# Patient Record
Sex: Female | Born: 1940 | Race: White | Hispanic: No | Marital: Married | Smoking: Current every day smoker
Health system: Southern US, Community
[De-identification: ages and names within clinical notes are randomized; demographics above are authoritative.]

## PROBLEM LIST (undated history)

## (undated) DIAGNOSIS — T8859XA Other complications of anesthesia, initial encounter: Secondary | ICD-10-CM

## (undated) DIAGNOSIS — Z720 Tobacco use: Principal | ICD-10-CM

## (undated) DIAGNOSIS — Z87891 Personal history of nicotine dependence: Principal | ICD-10-CM

## (undated) DIAGNOSIS — L570 Actinic keratosis: Secondary | ICD-10-CM

## (undated) DIAGNOSIS — C50919 Malignant neoplasm of unspecified site of unspecified female breast: Secondary | ICD-10-CM

## (undated) DIAGNOSIS — I10 Essential (primary) hypertension: Secondary | ICD-10-CM

## (undated) DIAGNOSIS — C801 Malignant (primary) neoplasm, unspecified: Secondary | ICD-10-CM

## (undated) DIAGNOSIS — Z923 Personal history of irradiation: Secondary | ICD-10-CM

## (undated) DIAGNOSIS — K219 Gastro-esophageal reflux disease without esophagitis: Secondary | ICD-10-CM

## (undated) DIAGNOSIS — T4145XA Adverse effect of unspecified anesthetic, initial encounter: Secondary | ICD-10-CM

## (undated) HISTORY — PX: BREAST EXCISIONAL BIOPSY: SUR124

## (undated) HISTORY — DX: Personal history of nicotine dependence: Z87.891

## (undated) HISTORY — DX: Actinic keratosis: L57.0

## (undated) HISTORY — PX: CHOLECYSTECTOMY: SHX55

## (undated) HISTORY — DX: Tobacco use: Z72.0

## (undated) HISTORY — PX: TUBAL LIGATION: SHX77

## (undated) HISTORY — DX: Essential (primary) hypertension: I10

---

## 1994-09-18 HISTORY — PX: OTHER SURGICAL HISTORY: SHX169

## 1994-09-18 HISTORY — PX: LEEP: SHX91

## 2006-09-18 DIAGNOSIS — C50919 Malignant neoplasm of unspecified site of unspecified female breast: Secondary | ICD-10-CM

## 2006-09-18 HISTORY — DX: Malignant neoplasm of unspecified site of unspecified female breast: C50.919

## 2006-09-18 HISTORY — PX: BREAST EXCISIONAL BIOPSY: SUR124

## 2006-09-18 HISTORY — PX: BREAST LUMPECTOMY: SHX2

## 2006-10-08 ENCOUNTER — Other Ambulatory Visit: Payer: Self-pay

## 2006-10-08 ENCOUNTER — Ambulatory Visit: Payer: Self-pay | Admitting: General Surgery

## 2006-10-12 ENCOUNTER — Ambulatory Visit: Payer: Self-pay | Admitting: General Surgery

## 2006-11-05 ENCOUNTER — Ambulatory Visit: Payer: Self-pay | Admitting: Radiation Oncology

## 2006-11-17 ENCOUNTER — Ambulatory Visit: Payer: Self-pay | Admitting: Radiation Oncology

## 2006-12-18 ENCOUNTER — Ambulatory Visit: Payer: Self-pay | Admitting: Radiation Oncology

## 2007-01-17 ENCOUNTER — Ambulatory Visit: Payer: Self-pay | Admitting: Radiation Oncology

## 2007-02-17 ENCOUNTER — Ambulatory Visit: Payer: Self-pay | Admitting: Radiation Oncology

## 2007-04-19 ENCOUNTER — Ambulatory Visit: Payer: Self-pay | Admitting: Oncology

## 2007-05-01 ENCOUNTER — Ambulatory Visit: Payer: Self-pay | Admitting: General Surgery

## 2007-05-02 ENCOUNTER — Ambulatory Visit: Payer: Self-pay | Admitting: Oncology

## 2007-05-20 ENCOUNTER — Ambulatory Visit: Payer: Self-pay | Admitting: Oncology

## 2007-06-23 ENCOUNTER — Emergency Department: Payer: Self-pay | Admitting: Unknown Physician Specialty

## 2007-06-23 ENCOUNTER — Other Ambulatory Visit: Payer: Self-pay

## 2007-07-02 ENCOUNTER — Ambulatory Visit: Payer: Self-pay | Admitting: General Surgery

## 2007-07-10 ENCOUNTER — Ambulatory Visit: Payer: Self-pay | Admitting: Radiation Oncology

## 2007-07-20 ENCOUNTER — Ambulatory Visit: Payer: Self-pay | Admitting: Radiation Oncology

## 2007-09-19 ENCOUNTER — Ambulatory Visit: Payer: Self-pay | Admitting: Oncology

## 2007-10-02 ENCOUNTER — Ambulatory Visit: Payer: Self-pay | Admitting: General Surgery

## 2007-10-20 ENCOUNTER — Ambulatory Visit: Payer: Self-pay | Admitting: Oncology

## 2007-11-08 ENCOUNTER — Ambulatory Visit: Payer: Self-pay | Admitting: Oncology

## 2007-11-17 ENCOUNTER — Ambulatory Visit: Payer: Self-pay | Admitting: Oncology

## 2007-12-18 ENCOUNTER — Ambulatory Visit: Payer: Self-pay | Admitting: Radiation Oncology

## 2008-01-09 ENCOUNTER — Ambulatory Visit: Payer: Self-pay | Admitting: Oncology

## 2008-01-17 ENCOUNTER — Ambulatory Visit: Payer: Self-pay | Admitting: Radiation Oncology

## 2008-04-13 ENCOUNTER — Ambulatory Visit: Payer: Self-pay | Admitting: General Surgery

## 2008-04-18 ENCOUNTER — Ambulatory Visit: Payer: Self-pay | Admitting: Oncology

## 2008-05-07 ENCOUNTER — Ambulatory Visit: Payer: Self-pay | Admitting: Oncology

## 2008-05-19 ENCOUNTER — Ambulatory Visit: Payer: Self-pay | Admitting: Oncology

## 2008-11-16 ENCOUNTER — Ambulatory Visit: Payer: Self-pay | Admitting: Oncology

## 2008-12-14 ENCOUNTER — Ambulatory Visit: Payer: Self-pay | Admitting: Oncology

## 2008-12-17 ENCOUNTER — Ambulatory Visit: Payer: Self-pay | Admitting: Oncology

## 2009-04-14 ENCOUNTER — Ambulatory Visit: Payer: Self-pay

## 2009-04-22 ENCOUNTER — Ambulatory Visit: Payer: Self-pay | Admitting: Family Medicine

## 2009-05-27 ENCOUNTER — Ambulatory Visit: Payer: Self-pay | Admitting: Surgery

## 2009-06-03 ENCOUNTER — Ambulatory Visit: Payer: Self-pay | Admitting: Surgery

## 2009-06-18 ENCOUNTER — Ambulatory Visit: Payer: Self-pay | Admitting: Oncology

## 2009-06-21 ENCOUNTER — Ambulatory Visit: Payer: Self-pay | Admitting: Oncology

## 2009-07-19 ENCOUNTER — Ambulatory Visit: Payer: Self-pay | Admitting: Oncology

## 2009-12-17 ENCOUNTER — Ambulatory Visit: Payer: Self-pay | Admitting: Oncology

## 2010-01-16 ENCOUNTER — Ambulatory Visit: Payer: Self-pay | Admitting: Oncology

## 2010-02-01 DIAGNOSIS — C439 Malignant melanoma of skin, unspecified: Secondary | ICD-10-CM

## 2010-02-01 HISTORY — DX: Malignant melanoma of skin, unspecified: C43.9

## 2010-06-02 ENCOUNTER — Ambulatory Visit: Payer: Self-pay | Admitting: Family Medicine

## 2010-06-18 ENCOUNTER — Ambulatory Visit: Payer: Self-pay | Admitting: Oncology

## 2010-06-20 ENCOUNTER — Ambulatory Visit: Payer: Self-pay | Admitting: Oncology

## 2010-06-21 LAB — CANCER ANTIGEN 27.29: CA 27.29: 12.1 U/mL (ref 0.0–38.6)

## 2010-07-19 ENCOUNTER — Ambulatory Visit: Payer: Self-pay | Admitting: Oncology

## 2010-12-20 ENCOUNTER — Ambulatory Visit: Payer: Self-pay | Admitting: Oncology

## 2010-12-21 LAB — CANCER ANTIGEN 27.29: CA 27.29: 21.3 U/mL (ref 0.0–38.6)

## 2011-01-17 ENCOUNTER — Ambulatory Visit: Payer: Self-pay | Admitting: Oncology

## 2011-06-26 ENCOUNTER — Ambulatory Visit: Payer: Self-pay | Admitting: Oncology

## 2011-06-29 ENCOUNTER — Ambulatory Visit: Payer: Self-pay | Admitting: Oncology

## 2011-07-20 ENCOUNTER — Ambulatory Visit: Payer: Self-pay | Admitting: Oncology

## 2011-12-28 ENCOUNTER — Ambulatory Visit: Payer: Self-pay | Admitting: Oncology

## 2011-12-28 LAB — COMPREHENSIVE METABOLIC PANEL
Albumin: 3.7 g/dL (ref 3.4–5.0)
Alkaline Phosphatase: 76 U/L (ref 50–136)
Anion Gap: 10 (ref 7–16)
Calcium, Total: 9.4 mg/dL (ref 8.5–10.1)
Chloride: 102 mmol/L (ref 98–107)
Co2: 29 mmol/L (ref 21–32)
Creatinine: 0.92 mg/dL (ref 0.60–1.30)
EGFR (Non-African Amer.): >60
Osmolality: 286 (ref 275–301)
Potassium: 3.4 mmol/L — ABNORMAL LOW (ref 3.5–5.1)
SGPT (ALT): 19 U/L
Sodium: 141 mmol/L (ref 136–145)
Total Protein: 7.6 g/dL (ref 6.4–8.2)

## 2011-12-28 LAB — CBC CANCER CENTER
Basophil #: 0 x10 3/mm (ref 0.0–0.1)
HGB: 13.8 g/dL (ref 12.0–16.0)
Lymphocyte %: 22.7 %
MCH: 30.8 pg (ref 26.0–34.0)
MCV: 91 fL (ref 80–100)
Monocyte #: 0.6 x10 3/mm (ref 0.2–0.9)
Neutrophil #: 5 x10 3/mm (ref 1.4–6.5)
Neutrophil %: 68.6 %
Platelet: 251 x10 3/mm (ref 150–440)
RDW: 13.2 % (ref 11.5–14.5)

## 2011-12-29 LAB — CANCER ANTIGEN 27.29: CA 27.29: 19.9 U/mL (ref 0.0–38.6)

## 2012-01-17 ENCOUNTER — Ambulatory Visit: Payer: Self-pay | Admitting: Oncology

## 2012-06-26 ENCOUNTER — Ambulatory Visit: Payer: Self-pay | Admitting: Oncology

## 2012-12-17 ENCOUNTER — Ambulatory Visit: Payer: Self-pay | Admitting: Oncology

## 2012-12-30 LAB — CBC CANCER CENTER
Basophil #: 0 x10 3/mm (ref 0.0–0.1)
Basophil %: 0.5 %
Eosinophil #: 0 x10 3/mm (ref 0.0–0.7)
HCT: 43.1 % (ref 35.0–47.0)
Lymphocyte #: 2.2 x10 3/mm (ref 1.0–3.6)
MCH: 30.7 pg (ref 26.0–34.0)
MCHC: 34.1 g/dL (ref 32.0–36.0)
MCV: 90 fL (ref 80–100)
Monocyte #: 0.5 x10 3/mm (ref 0.2–0.9)
Monocyte %: 6.6 %
Neutrophil #: 5.3 x10 3/mm (ref 1.4–6.5)
RBC: 4.78 10*6/uL (ref 3.80–5.20)
RDW: 13.1 % (ref 11.5–14.5)
WBC: 8.1 x10 3/mm (ref 3.6–11.0)

## 2012-12-30 LAB — COMPREHENSIVE METABOLIC PANEL
Albumin: 3.6 g/dL (ref 3.4–5.0)
Alkaline Phosphatase: 79 U/L (ref 50–136)
Anion Gap: 9 (ref 7–16)
BUN: 18 mg/dL (ref 7–18)
Bilirubin,Total: 0.4 mg/dL (ref 0.2–1.0)
Calcium, Total: 9.6 mg/dL (ref 8.5–10.1)
Chloride: 101 mmol/L (ref 98–107)
Co2: 31 mmol/L (ref 21–32)
Creatinine: 1.08 mg/dL (ref 0.60–1.30)
EGFR (African American): 59 — ABNORMAL LOW
EGFR (Non-African Amer.): 51 — ABNORMAL LOW
Osmolality: 284 (ref 275–301)
Potassium: 3.3 mmol/L — ABNORMAL LOW (ref 3.5–5.1)
SGOT(AST): 18 U/L (ref 15–37)
SGPT (ALT): 24 U/L (ref 12–78)
Sodium: 141 mmol/L (ref 136–145)

## 2013-01-16 ENCOUNTER — Ambulatory Visit: Payer: Self-pay | Admitting: Oncology

## 2013-06-30 ENCOUNTER — Ambulatory Visit: Payer: Self-pay | Admitting: Oncology

## 2013-12-26 ENCOUNTER — Ambulatory Visit: Payer: Self-pay | Admitting: Oncology

## 2013-12-29 LAB — CBC CANCER CENTER
Basophil #: 0 x10 3/mm (ref 0.0–0.1)
Basophil %: 0.5 %
Eosinophil #: 0.1 x10 3/mm (ref 0.0–0.7)
Eosinophil %: 1.2 %
HCT: 43.4 % (ref 35.0–47.0)
HGB: 14.2 g/dL (ref 12.0–16.0)
Lymphocyte #: 1.9 x10 3/mm (ref 1.0–3.6)
Lymphocyte %: 22.9 %
MCH: 30 pg (ref 26.0–34.0)
MCHC: 32.7 g/dL (ref 32.0–36.0)
MCV: 92 fL (ref 80–100)
MONOS PCT: 6.5 %
Monocyte #: 0.5 x10 3/mm (ref 0.2–0.9)
Neutrophil #: 5.7 x10 3/mm (ref 1.4–6.5)
Neutrophil %: 68.9 %
PLATELETS: 281 x10 3/mm (ref 150–440)
RBC: 4.72 10*6/uL (ref 3.80–5.20)
RDW: 13.8 % (ref 11.5–14.5)
WBC: 8.2 x10 3/mm (ref 3.6–11.0)

## 2013-12-29 LAB — COMPREHENSIVE METABOLIC PANEL
ALBUMIN: 3.7 g/dL (ref 3.4–5.0)
ANION GAP: 5 — AB (ref 7–16)
AST: 18 U/L (ref 15–37)
Alkaline Phosphatase: 59 U/L
BILIRUBIN TOTAL: 0.4 mg/dL (ref 0.2–1.0)
BUN: 19 mg/dL — ABNORMAL HIGH (ref 7–18)
CALCIUM: 9.5 mg/dL (ref 8.5–10.1)
Chloride: 102 mmol/L (ref 98–107)
Co2: 35 mmol/L — ABNORMAL HIGH (ref 21–32)
Creatinine: 0.95 mg/dL (ref 0.60–1.30)
EGFR (Non-African Amer.): 59 — ABNORMAL LOW
GLUCOSE: 125 mg/dL — AB (ref 65–99)
Osmolality: 287 (ref 275–301)
Potassium: 3.1 mmol/L — ABNORMAL LOW (ref 3.5–5.1)
SGPT (ALT): 16 U/L (ref 12–78)
Sodium: 142 mmol/L (ref 136–145)
TOTAL PROTEIN: 7.6 g/dL (ref 6.4–8.2)

## 2014-01-16 ENCOUNTER — Ambulatory Visit: Payer: Self-pay | Admitting: Oncology

## 2014-07-01 ENCOUNTER — Ambulatory Visit: Payer: Self-pay | Admitting: Oncology

## 2014-12-30 ENCOUNTER — Ambulatory Visit
Admit: 2014-12-30 | Disposition: A | Discharge status: Home / Self Care | Payer: Self-pay | Attending: Oncology | Admitting: Oncology

## 2014-12-30 LAB — COMPREHENSIVE METABOLIC PANEL
ALT: 15 U/L
AST: 23 U/L
Albumin: 4.1 g/dL
Alkaline Phosphatase: 47 U/L
Anion Gap: 6 — ABNORMAL LOW (ref 7–16)
BILIRUBIN TOTAL: 0.7 mg/dL
BUN: 21 mg/dL — AB
CREATININE: 0.99 mg/dL
Calcium, Total: 9.2 mg/dL
Chloride: 100 mmol/L — ABNORMAL LOW
Co2: 30 mmol/L
EGFR (African American): >60
EGFR (Non-African Amer.): 56 — ABNORMAL LOW
Glucose: 117 mg/dL — ABNORMAL HIGH
POTASSIUM: 3 mmol/L — AB
Sodium: 136 mmol/L
TOTAL PROTEIN: 7.3 g/dL

## 2014-12-30 LAB — CBC CANCER CENTER
BASOS PCT: 0.7 %
Basophil #: 0.1 x10 3/mm (ref 0.0–0.1)
EOS ABS: 0 x10 3/mm (ref 0.0–0.7)
Eosinophil %: 0.4 %
HCT: 42.6 % (ref 35.0–47.0)
HGB: 14.2 g/dL (ref 12.0–16.0)
Lymphocyte #: 1.8 x10 3/mm (ref 1.0–3.6)
Lymphocyte %: 23.1 %
MCH: 30.6 pg (ref 26.0–34.0)
MCHC: 33.4 g/dL (ref 32.0–36.0)
MCV: 92 fL (ref 80–100)
MONO ABS: 0.5 x10 3/mm (ref 0.2–0.9)
Monocyte %: 7 %
NEUTROS PCT: 68.8 %
Neutrophil #: 5.4 x10 3/mm (ref 1.4–6.5)
Platelet: 266 x10 3/mm (ref 150–440)
RBC: 4.65 10*6/uL (ref 3.80–5.20)
RDW: 13.3 % (ref 11.5–14.5)
WBC: 7.8 x10 3/mm (ref 3.6–11.0)

## 2014-12-31 LAB — CANCER ANTIGEN 27.29: CA 27.29: 14.9 U/mL (ref 0.0–38.6)

## 2015-01-14 ENCOUNTER — Other Ambulatory Visit: Payer: Self-pay | Admitting: Oncology

## 2015-01-14 DIAGNOSIS — Z1231 Encounter for screening mammogram for malignant neoplasm of breast: Secondary | ICD-10-CM

## 2015-01-19 ENCOUNTER — Other Ambulatory Visit: Payer: Self-pay | Admitting: *Deleted

## 2015-01-27 ENCOUNTER — Encounter: Payer: Self-pay | Admitting: Family Medicine

## 2015-01-27 ENCOUNTER — Other Ambulatory Visit: Payer: Self-pay | Admitting: Family Medicine

## 2015-01-27 DIAGNOSIS — Z720 Tobacco use: Secondary | ICD-10-CM

## 2015-01-27 HISTORY — DX: Tobacco use: Z72.0

## 2015-01-29 ENCOUNTER — Other Ambulatory Visit: Payer: Self-pay | Admitting: Family Medicine

## 2015-01-29 ENCOUNTER — Inpatient Hospital Stay: Payer: Medicare Other | Attending: Family Medicine | Admitting: Family Medicine

## 2015-01-29 ENCOUNTER — Encounter: Payer: Self-pay | Admitting: Family Medicine

## 2015-01-29 ENCOUNTER — Ambulatory Visit
Admission: RE | Admit: 2015-01-29 | Discharge: 2015-01-29 | Disposition: A | Discharge status: Home / Self Care | Payer: Medicare Other | Source: Ambulatory Visit | Attending: Family Medicine | Admitting: Family Medicine

## 2015-01-29 DIAGNOSIS — Z122 Encounter for screening for malignant neoplasm of respiratory organs: Secondary | ICD-10-CM

## 2015-01-29 DIAGNOSIS — Z720 Tobacco use: Secondary | ICD-10-CM

## 2015-01-29 DIAGNOSIS — F17210 Nicotine dependence, cigarettes, uncomplicated: Secondary | ICD-10-CM | POA: Diagnosis not present

## 2015-01-29 DIAGNOSIS — F17200 Nicotine dependence, unspecified, uncomplicated: Secondary | ICD-10-CM | POA: Diagnosis present

## 2015-01-29 DIAGNOSIS — R918 Other nonspecific abnormal finding of lung field: Secondary | ICD-10-CM | POA: Diagnosis not present

## 2015-01-29 NOTE — Progress Notes (Signed)
In accordance with CMS guidelines, patient has meet eligibility criteria including age, absence of signs or symptoms of lung cancer, the specific calculation of cigarette smoking pack-years was 30 years and is a current smoker.   A shared decision-making session was been conducted prior to the performance of CT scan. This includes one or more decision aids, includes benefits and harms of screening, follow-up diagnostic testing, over-diagnosis, false positive rate, and total radiation exposure.  Counseling on the importance of adherence to annual lung cancer LDCT screening, impact of co-morbidities, and ability or willingness to undergo diagnosis and treatment is imperative for compliance of the program.  Counseling on the importance of continued smoking cessation for former smokers; the importance of smoking cessation for current smokers and information about tobacco cessation interventions have been given to patient including the [place] at [facility], 1800 quit [place], as well as [place] specific smoking cessation programs.  Written order for lung cancer screening with LDCT has been given to the patient and any and all questions have been answered to the best of my abilities.   Yearly follow up will be scheduled by Burgess Estelle, Thoracic Navigator.

## 2015-02-01 ENCOUNTER — Other Ambulatory Visit: Payer: Self-pay | Admitting: Family Medicine

## 2015-02-01 MED ORDER — AMOXICILLIN-POT CLAVULANATE 875-125 MG PO TABS: 1.0000 | ORAL_TABLET | Freq: Two times a day (BID) | ORAL | Status: DC

## 2015-02-02 ENCOUNTER — Telehealth: Payer: Self-pay | Admitting: *Deleted

## 2015-02-02 NOTE — Telephone Encounter (Signed)
Notified patient of LDCT lung cancer screening results of Lung Rads 0 finding with recommendation for 3 month follow up imaging after a course of antibiotics. Prescription sent to pharmacy by Georgeanne Nim NP. Patient verbalizes understanding.

## 2015-03-09 ENCOUNTER — Other Ambulatory Visit: Payer: Self-pay | Admitting: Family Medicine

## 2015-05-14 ENCOUNTER — Telehealth: Payer: Self-pay

## 2015-05-14 NOTE — Telephone Encounter (Signed)
Navigator Encounter Type: Telephone, Screening  Notified patient that follow-up lung cancer screening low dose CT scan is due. Patient was advised to return for repeat CT following antibiotic therapy. Patient's last screening date was 01/29/2015. Patient was agreeable to scheduling of CT scan.

## 2015-05-31 ENCOUNTER — Encounter: Payer: Self-pay | Admitting: Family Medicine

## 2015-05-31 ENCOUNTER — Other Ambulatory Visit: Payer: Self-pay | Admitting: Family Medicine

## 2015-05-31 DIAGNOSIS — Z87891 Personal history of nicotine dependence: Secondary | ICD-10-CM

## 2015-05-31 HISTORY — DX: Personal history of nicotine dependence: Z87.891

## 2015-06-04 ENCOUNTER — Ambulatory Visit: Payer: Medicare Other | Admitting: Family Medicine

## 2015-06-08 ENCOUNTER — Ambulatory Visit
Admission: RE | Admit: 2015-06-08 | Discharge: 2015-06-08 | Disposition: A | Discharge status: Home / Self Care | Payer: Medicare Other | Source: Ambulatory Visit | Attending: Family Medicine | Admitting: Family Medicine

## 2015-06-08 ENCOUNTER — Telehealth: Payer: Self-pay | Admitting: *Deleted

## 2015-06-08 DIAGNOSIS — R938 Abnormal findings on diagnostic imaging of other specified body structures: Secondary | ICD-10-CM | POA: Diagnosis not present

## 2015-06-08 DIAGNOSIS — Z87891 Personal history of nicotine dependence: Secondary | ICD-10-CM

## 2015-06-08 DIAGNOSIS — R918 Other nonspecific abnormal finding of lung field: Secondary | ICD-10-CM | POA: Diagnosis not present

## 2015-06-08 NOTE — Telephone Encounter (Signed)
For lung cancer screening. Confirmed that patient is within the age range of 55-77, and asymptomatic, (no signs or symptoms of lung cancer). The patient is a current smoker, with a 49 pack year history. The shared decision making visit was done 01/29/15. Patient is agreeable for CT scan being scheduled.

## 2015-06-23 ENCOUNTER — Ambulatory Visit: Payer: Medicare Other | Admitting: Oncology

## 2015-07-06 ENCOUNTER — Inpatient Hospital Stay: Payer: Medicare Other | Attending: Oncology | Admitting: Oncology

## 2015-07-06 ENCOUNTER — Ambulatory Visit
Admission: RE | Admit: 2015-07-06 | Discharge: 2015-07-06 | Disposition: A | Discharge status: Home / Self Care | Payer: Medicare Other | Source: Ambulatory Visit | Attending: Oncology | Admitting: Oncology

## 2015-07-06 ENCOUNTER — Encounter: Payer: Self-pay | Admitting: Oncology

## 2015-07-06 ENCOUNTER — Other Ambulatory Visit: Payer: Self-pay | Admitting: Oncology

## 2015-07-06 VITALS — BP 148/77 | HR 97 | Temp 95.7°F | Wt 110.7 lb

## 2015-07-06 DIAGNOSIS — Z853 Personal history of malignant neoplasm of breast: Secondary | ICD-10-CM | POA: Diagnosis not present

## 2015-07-06 DIAGNOSIS — Z170 Estrogen receptor positive status [ER+]: Secondary | ICD-10-CM | POA: Diagnosis not present

## 2015-07-06 DIAGNOSIS — I10 Essential (primary) hypertension: Secondary | ICD-10-CM | POA: Diagnosis not present

## 2015-07-06 DIAGNOSIS — E079 Disorder of thyroid, unspecified: Secondary | ICD-10-CM | POA: Insufficient documentation

## 2015-07-06 DIAGNOSIS — Z923 Personal history of irradiation: Secondary | ICD-10-CM | POA: Diagnosis not present

## 2015-07-06 DIAGNOSIS — Z1231 Encounter for screening mammogram for malignant neoplasm of breast: Secondary | ICD-10-CM | POA: Diagnosis present

## 2015-07-06 DIAGNOSIS — C50919 Malignant neoplasm of unspecified site of unspecified female breast: Secondary | ICD-10-CM | POA: Insufficient documentation

## 2015-07-06 DIAGNOSIS — M1990 Unspecified osteoarthritis, unspecified site: Secondary | ICD-10-CM | POA: Insufficient documentation

## 2015-07-06 DIAGNOSIS — R918 Other nonspecific abnormal finding of lung field: Secondary | ICD-10-CM | POA: Insufficient documentation

## 2015-07-06 DIAGNOSIS — Z79899 Other long term (current) drug therapy: Secondary | ICD-10-CM | POA: Diagnosis not present

## 2015-07-06 DIAGNOSIS — Z9223 Personal history of estrogen therapy: Secondary | ICD-10-CM | POA: Diagnosis not present

## 2015-07-06 DIAGNOSIS — F17210 Nicotine dependence, cigarettes, uncomplicated: Secondary | ICD-10-CM | POA: Insufficient documentation

## 2015-07-06 DIAGNOSIS — C50212 Malignant neoplasm of upper-inner quadrant of left female breast: Secondary | ICD-10-CM

## 2015-07-06 DIAGNOSIS — Z800 Family history of malignant neoplasm of digestive organs: Secondary | ICD-10-CM | POA: Diagnosis not present

## 2015-07-06 DIAGNOSIS — G43909 Migraine, unspecified, not intractable, without status migrainosus: Secondary | ICD-10-CM | POA: Insufficient documentation

## 2015-07-06 HISTORY — DX: Malignant (primary) neoplasm, unspecified: C80.1

## 2015-07-06 NOTE — Progress Notes (Signed)
[place] @ [facility] [phone]  Fax:([phone]) [place] OB: Feb 11, 1941  MR#: [id number]  HFW#:[id number]  Patient Care Team: [place] Pink, MD as PCP - General (Family Medicine)  CHIEF COMPLAINT: Chief Complaint/Diagnosis:   carcinoma of breast, Tis, NX, MX, stage 0, estrogen receptor positive, progesterone receptor positive. Margins, and negative Primary Care Physician:  Duffy Rhody MD Dr. Enzo Bi  Chief Complaint/Problem List:  Carcinoma of breast.  Status post lumpectomy and radiation therapy.  Diagnosis was in January of 2008  AJCC Staging: [id number], pTisNXMX Stage Grouping: 0 Cancer Status: No evidence of disease. Estrogen receptor positive. Progesterone receptor 0. Margins are negative.   Patient was started on Arimidex in 2008 after radiation was finished Has finished   Arimidex in  October of 2013 HPI:   2.  Abnormal CT scan of the chest in a patient was chronic smoker   INTERVAL HISTORY: 74 year old lady who had a screening CT scan in April of 2016.  Was showing bilateral nodular infiltrate.  Patient had a repeat CT scan in September which shows persistent patchy infiltrate Patient does not have any cough or hemoptysis or chest pain.  Patient continues to smoke.  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  Active.  No fevers, sweats or weight loss. PERFORMANCE STATUS (ECOG):0 HEENT:  No visual changes, runny nose, sore throat, mouth sores or tenderness. Lungs: No shortness of breath or cough.  No hemoptysis. Cardiac:  No chest pain, palpitations, orthopnea, or PND. GI:  No nausea, vomiting, diarrhea, constipation, melena or hematochezia. GU:  No urgency, frequency, dysuria, or hematuria. Musculoskeletal:  No back pain.  No  joint pain.  No muscle tenderness. Extremities:  No pain or swelling. Skin:  No rashes or skin changes. Neuro:  No headache, numbness or weakness, balance or coordination issues. Endocrine:  No diabetes, thyroid issues, hot flashes or night sweats. Psych:  No mood changes, depression or anxiety. Pain:  No focal pain. Review of systems:  All other systems reviewed and found to be negative. As per HPI. Otherwise, a complete review of systems is negatve.  PAST MEDICAL HISTORY: Past Medical History  Diagnosis Date  . Tobacco abuse 01/27/2015  . Tobacco abuse 01/27/2015  . Personal history of tobacco use, presenting hazards to health 05/31/2015    Significant History/PMH:   htn:    radiation to breast:    tubal ligation:    leep procedure:   Preventive Screening:  Has patient had any of the following test? Mammography (1)   Last Mammography: October 2013(1)   Smoking History: Smoking History 1/2 pack per day since college days.  PFSH: Additional Past Medical and Surgical History: has. been reviewed  Past Medical History:  No significant past history except for high blood pressure    Past Surgical History:  Previous lumpectomy on the right breast with atypical proliferation of cells status post 5 years of Tamoxifen finished in 2006.    Family History:  No family history of colorectal cancer, breast cancer, or ovarian cancer.   History of pancreatic cancer in the family.    Social History:  Occasionally drinks alcohol. Does not use any recreational drugs.   ADVANCED DIRECTIVES:  No flowsheet data found.  HEALTH MAINTENANCE: Social History  Substance Use Topics  .  Smoking status: Current Every Day Smoker -- 1.50 packs/day for 32 years    Types: Cigarettes  . Smokeless tobacco: Never Used  . Alcohol Use: None      Allergies  Allergen Reactions  . Codeine Nausea And Vomiting  . Midol Pm  [Diphenhydramine-Acetaminophen] Rash    Other Reaction: RASH & SWELLING     Current Outpatient Prescriptions  Medication Sig Dispense Refill  . alendronate (FOSAMAX) 70 MG tablet   1  . amLODipine (NORVASC) 2.5 MG tablet Take 7.5 mg by mouth daily.  0  . amoxicillin-clavulanate (AUGMENTIN) 875-125 MG per tablet Take 1 tablet by mouth 2 (two) times daily. (Patient not taking: Reported on 07/06/2015) 28 tablet 0  . FLUZONE HIGH-DOSE 0.5 ML SUSY inject 0.5 milliliter intramuscularly  0  . potassium chloride (MICRO-K) 10 MEQ CR capsule   0  . triamterene-hydrochlorothiazide (DYAZIDE) 37.5-25 MG capsule   1   No current facility-administered medications for this visit.    OBJECTIVE:  Filed Vitals:   07/06/15 0920  BP: 148/77  Pulse: 97  Temp: 95.7 F (35.4 C)     Body mass index is 21.25 kg/(m^2).    ECOG FS:1 - Symptomatic but completely ambulatory  PHYSICAL EXAM: GENERAL:  Well developed, well nourished, sitting comfortably in the exam room in no acute distress. MENTAL STATUS:  Alert and oriented to person, place and time.  RESPIRATORY:  Clear to auscultation without rales, wheezes or rhonchi. CARDIOVASCULAR:  Regular rate and rhythm without murmur, rub or gallop. BREAST:  Right breast without masses, skin changes or nipple discharge.  Left breast without masses, skin changes or nipple discharge. ABDOMEN:  Soft, non-tender, with active bowel sounds, and no hepatosplenomegaly.  No masses. BACK:  No CVA tenderness.  No tenderness on percussion of the back or rib cage. SKIN:  No rashes, ulcers or lesions. EXTREMITIES: No edema, no skin discoloration or tenderness.  No palpable cords. LYMPH NODES: No palpable cervical, supraclavicular, axillary or inguinal adenopathy  NEUROLOGICAL: Unremarkable. PSYCH:  Appropriate.   LAB RESULTS:  CBC Latest Ref Rng 12/30/2014 12/29/2013  WBC 3.6-11.0 x10 3/mm  7.8 8.2  Hemoglobin 12.0-16.0 g/dL 14.2 14.2  Hematocrit 35.0-47.0 % 42.6 43.4  Platelets 150-440 x10 3/mm  266 281    No visits with results within 5  Day(s) from this visit. Latest known visit with results is:  Hospital Outpatient Visit on 12/30/2014  Component Date Value Ref Range Status  . CA 27.29 12/30/2014 14.9  0.0-38.6 U/mL Final   Comment:  [employer]            No: [id number]           [address] [place], [place] [address]           Lindon Romp, MD         [phone] Result(s) reported on 31 Dec 2014 at 12:48PM.   . WBC 12/30/2014 7.8  3.6-11.0 x10 3/mm  Final  . RBC 12/30/2014 4.65  3.80-5.20 x10 6/mm  Final  . HGB 12/30/2014 14.2  12.0-16.0 g/dL Final  . HCT 12/30/2014 42.6  35.0-47.0 % Final  . MCV 12/30/2014 92  80-100 fL Final  . MCH 12/30/2014 30.6  26.0-34.0 pg Final  . MCHC 12/30/2014 33.4  32.0-36.0 g/dL Final  . RDW 12/30/2014 13.3  11.5-14.5 % Final  . Platelet 12/30/2014 266  150-440 x10 3/mm  Final  . Neutrophil %  12/30/2014 68.8   Final  . Lymphocyte % 12/30/2014 23.1   Final  . Monocyte % 12/30/2014 7.0   Final  . Eosinophil % 12/30/2014 0.4   Final  . Basophil % 12/30/2014 0.7   Final  . Neutrophil # 12/30/2014 5.4  1.4-6.5 x10 3/mm  Final  . Lymphocyte # 12/30/2014 1.8  1.0-3.6 x10 3/mm  Final  . Monocyte # 12/30/2014 0.5  0.2-0.9 x10 3/mm  Final  . Eosinophil # 12/30/2014 0.0  0.0-0.7 x10 3/mm  Final  . Basophil # 12/30/2014 0.1  0.0-0.1 x10 3/mm  Final  . Glucose, CSF 12/30/2014 DNP   Corrected   Comment: 65-99 NOTE: New Reference Range  11/24/14   . BUN 12/30/2014 21*  Final   Comment: 6-20 NOTE: New Reference Range  11/24/14   . Creatinine 12/30/2014 0.99   Final   Comment: 0.44-1.00 NOTE: New Reference Range  11/24/14   . Sodium, Urine Random 12/30/2014 DNP   Corrected   Comment: 135-145 NOTE: New Reference Range  11/24/14   . Potassium, Urine Random 12/30/2014 DNP   Corrected   Comment: 3.5-5.1 NOTE: New Reference Range  11/24/14   . Chloride, Urine Random 12/30/2014 DNP   Corrected   Comment: 101-111 NOTE:  New Reference Range  11/24/14   . Co2 12/30/2014 30   Final   Comment: 22-32 NOTE: New Reference Range  11/24/14   . Calcium, Total 12/30/2014 9.2   Final   Comment: 8.9-10.3 NOTE: New Reference Range  11/24/14   . SGOT(AST) 12/30/2014 23   Final   Comment: 15-41 NOTE: New Reference Range  11/24/14   . SGPT (ALT) 12/30/2014 15   Final   Comment: 14-54 NOTE: New Reference Range  11/24/14   . Alkaline Phosphatase 12/30/2014 47   Final   Comment: 38-126 NOTE: New Reference Range  11/24/14   . Albumin 12/30/2014 4.1   Final   Comment: 3.5-5.0 NOTE: New reference range  11/24/14   . Total Protein 12/30/2014 7.3   Final   Comment: 6.5-8.1 NOTE: New Reference Range  11/24/14   . Bilirubin,Total 12/30/2014 0.7   Final   Comment: 0.3-1.2 NOTE: New Reference Range  11/24/14   . Anion Gap 12/30/2014 6* 7-16 Final  . EGFR (African American) 12/30/2014 >60   Final  . EGFR (Non-African Amer.) 12/30/2014 56*  Final   Comment: eGFR values <72m/min/1.73 m2 may be an indication of chronic kidney disease (CKD). Calculated eGFR is useful in patients with stable renal function. The eGFR calculation will not be reliable in acutely ill patients when serum creatinine is changing rapidly. It is not useful in patients on dialysis. The eGFR calculation may not be applicable to patients at the low and high extremes of body sizes, pregnant women, and vegetarians.   . Glucose 12/30/2014 117*  Final   Comment: 65-99 NOTE: New Reference Range  11/24/14   . Sodium 12/30/2014 136   Final   Comment: 135-145 NOTE: New Reference Range  11/24/14   . Potassium 12/30/2014 3.0*  Final   Comment: 3.5-5.1 NOTE: New Reference Range  11/24/14   . Chloride 12/30/2014 100*  Final   Comment: 101-111 NOTE: New Reference Range  11/24/14        STUDIES: Ct Chest Lung Ca Screen Low Dose W/o Cm  06/11/2015  CLINICAL DATA:  74year old female current smoker with 50 pack-year history of  smoking. Lung cancer screening examination. EXAM: CT CHEST WITHOUT CONTRAST TECHNIQUE: Multidetector CT imaging of the  chest was performed following the standard protocol without IV contrast. COMPARISON:  Low-dose lung cancer screening chest CT 01/29/2015. FINDINGS: Mediastinum/Lymph Nodes: Heart size is normal. There is no significant pericardial fluid, thickening or pericardial calcification. No pathologically enlarged mediastinal or hilar lymph nodes. Please note that accurate exclusion of hilar adenopathy is limited on noncontrast CT scans. Again noted is asymmetric mass-like enlargement of the right lobe of the thyroid gland which measures up to 3.2 cm (similar to prior). Small hiatal hernia. No axillary lymphadenopathy. Lungs/Pleura: Again noted are innumerable nodular areas of ground-glass attenuation throughout the lungs bilaterally, most evident in the mid to upper lungs. The largest of these lesions is in the inferior aspect of the right upper lobe adjacent to the minor fissure (image 143 of series 3), demonstrating a volume derived mean diameter of 14.8 mm. Although this is smaller than the previously reported measurement, the previously reported measurement was limited by volumetric rendering, and is considered not completely accurate. This lesion is considered stable in size on today's examination when directly compared with the prior. Because of the large number of ground-glass attenuation nodules, not all of the lesions were volumetrically rendered on today's examination, because of technical challenges associated with doing so. However, they appear generally stable in size and number compared to the prior study from 01/29/2015. Mild diffuse bronchial wall thickening with mild centrilobular and paraseptal emphysema. No acute consolidative airspace disease. No pleural effusions. Scattered areas of mild cylindrical bronchiectasis are noted, most evident in the lower lobes of the lungs and in the right  middle lobe. Small right-sided Bochdalek's hernia. Upper Abdomen: Status post cholecystectomy. Incompletely visualized exophytic low-attenuation lesion measuring at least 3.7 cm in the medial aspect of the upper pole of the right kidney is similar to remote prior CT the abdomen and pelvis 06/23/2007, and although incompletely characterized, is likely a cyst. Musculoskeletal/Soft Tissues: Focal area of architectural distortion, lobulated soft tissue density and peripheral calcification in the fat of the left breast, which appears generally similar to the prior study, although there is a focus of increasing soft tissue prominence along the deep margin of this region measuring 11 mm on today's examination (best demonstrated on image 28 of series 2). There are no aggressive appearing lytic or blastic lesions noted in the visualized portions of the skeleton. IMPRESSION: 1. Again noted are multiple predominantly ground-glass attenuation lesions throughout the lungs bilaterally, which generally appear stable compared to the prior examination, as discussed above. Technically, this is classified as a Lung-RADS 2 (benign findings) examination, however, this appearance is very unusual. The possibility of widespread low-grade neoplasm such as primary adenocarcinoma of the lungs should be considered, and a lack of interval growth may simply reflect the indolent nature of such a neoplasm. The largest lesion in the inferior aspect of the right upper lobe (immediately above the minor fissure) on image 143 of series 3 may represent a suitable target for biopsy, as a bronchus appears to traverse immediately adjacent to this. If the patient is not considered for biopsy at this time, a follow-up repeat low dose lung cancer screening chest CT is suggested in 6 months rather than 12 months. 2. The "S" modifier above refers to potentially clinically significant non lung cancer related findings. Specifically, there is a focal area of  architectural distortion in the left breast favored to represent scarring at site of prior lumpectomy. However, there is also a small amount of soft tissue prominence along the deep margin on today's examination appears increased  compared to the prior study. Correlation with mammography is recommended to exclude the possibility of neoplasm. 3. Additional incidental findings, as above. Electronically Signed   By: Vinnie Langton M.D.   On: 06/11/2015 09:25    ASSESSMENT: 1.  Patient has a history of chronic smoking Counseling was done 2.  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  Diagnosis of breast cancer was in 2008.  Patient is off all anti-hormonal therapy 3.  Abnormal CT scan with patchy infiltrate somewhat improvement however both major area of patchy infiltrate persist I reviewed CT scan independently and compared with the previous CT scan. I discussed possibility of repeating CT scan versus getting pulmonology evaluation for possibility of navigation bronchoscopy.  Case will be discussed in tumor conference. 3.  There is a palpable thyroid nodule which needs to be evaluated by ENT surgeon Discuss case in tumor conference If possible will obtain ENT opinion as well as pulmonologists opinion  Patient is getting regular mammogram  Patient expressed understanding and was in agreement with this plan. She also understands that She can call clinic at any time with any questions, concerns, or complaints.    No matching staging information was found for the patient.  Forest Gleason, MD   07/06/2015 12:47 PM

## 2015-07-06 NOTE — Progress Notes (Signed)
Patient here for CT results.  

## 2015-07-22 ENCOUNTER — Ambulatory Visit
Admission: RE | Admit: 2015-07-22 | Discharge: 2015-07-22 | Disposition: A | Discharge status: Home / Self Care | Payer: Medicare Other | Source: Ambulatory Visit | Attending: Pulmonary Disease | Admitting: Pulmonary Disease

## 2015-07-22 ENCOUNTER — Encounter (INDEPENDENT_AMBULATORY_CARE_PROVIDER_SITE_OTHER): Payer: Self-pay

## 2015-07-22 ENCOUNTER — Encounter: Payer: Self-pay | Admitting: Pulmonary Disease

## 2015-07-22 ENCOUNTER — Ambulatory Visit (INDEPENDENT_AMBULATORY_CARE_PROVIDER_SITE_OTHER): Payer: Medicare Other | Admitting: Pulmonary Disease

## 2015-07-22 VITALS — BP 120/82 | HR 81 | Ht 61.0 in | Wt 132.0 lb

## 2015-07-22 DIAGNOSIS — J189 Pneumonia, unspecified organism: Secondary | ICD-10-CM

## 2015-07-22 DIAGNOSIS — Z720 Tobacco use: Secondary | ICD-10-CM

## 2015-07-22 DIAGNOSIS — J9811 Atelectasis: Secondary | ICD-10-CM | POA: Insufficient documentation

## 2015-07-22 DIAGNOSIS — J984 Other disorders of lung: Secondary | ICD-10-CM

## 2015-07-22 DIAGNOSIS — F17200 Nicotine dependence, unspecified, uncomplicated: Secondary | ICD-10-CM

## 2015-07-22 NOTE — Progress Notes (Signed)
PULMONARY CONSULT NOTE  Requesting MD/Service: [place] Date of consult: 07/22/15 Reason for consultation: pulmonary infiltrates   Pt Profile:  67 F smoker with history of breast cancer with history of breast cancer who is followed closely by Dr Oliva Bustard. She underwent LDCT 01/29/15 which revealed bilateral ground glass opacities. A repeat LDCT was performed 06/08/15 with similar findings. Referred to pulmonary medicine for further evaluation.     HPI:  History as above. She has no respiratory symptoms or chest symptoms other than minimal cough productive of scant clear mucus. She has previously smoked up to 1/2 ppd cigarettes and currently smokes 2-3 cigs per day. She has no constitutional symptoms of fever, weight loss, fatigue or night sweats. She has no other systemic symptoms such as myalgias, arthralgias, rashes, lymphadenopathy, ocular symptoms.  Past Medical History  Diagnosis Date  . Tobacco abuse 01/27/2015  . Tobacco abuse 01/27/2015  . Personal history of tobacco use, presenting hazards to health 05/31/2015  . Cancer ([place])     melanoma  . Hypertension    Past Surgical History  Procedure Laterality Date  . Breast excisional biopsy Left 2008    + radation  . Breast excisional biopsy Right 2001 2010    neg surgical bx's    MEDICATIONS: reviewed  SH: smoking history as above. No significant occupational or environmental exposures  Family History  Problem Relation Age of Onset  . Cancer Father    Review of Systems  Constitutional: Negative.   HENT: Negative.   Eyes: Negative.   Respiratory: Negative.   Cardiovascular: Negative.   Gastrointestinal: Negative.   Musculoskeletal: Negative.   Skin: Negative.   Neurological: Negative.   Endo/Heme/Allergies: Negative.     Filed Vitals:   07/22/15 1026  BP: 120/82  Pulse: 81  Height: 5\' 1"  (1.549 m)  Weight: 132 lb (59.875 kg)  SpO2: 98%    EXAM:   Gen: WDWN in NAD HEENT: All WNL Neck: NO LAN, no JVD  noted Lungs: full BS, normal percussion note throughout, no adventitious sounds Cardiovascular: Reg rate, normal rhythm, no M noted Abdomen: Soft, NT +BS Ext: no C/C/E Neuro: CNs intact, motor/sens grossly intact Skin: No lesions noted   DATA:  LDCT 01/29/15: IMPRESSION: 1. Lung-RADS Category 0, incomplete. Scattered ground-glass attenuating opacities and are identified throughout both lungs. Likely postinflammatory or infectious. This limits the ability to evaluate parts of the lung. Recommend repeat low dose Lung cancer screening in 3 months following trial of antibiotic therapy and complete resolution of patient's symptoms.  LDCT 06/08/15: IMPRESSION: 1. Again noted are multiple predominantly ground-glass attenuation lesions throughout the lungs bilaterally, which generally appear stable compared to the prior examination, as discussed above. Technically, this is classified as a Lung-RADS 2 (benign findings) examination, however, this appearance is very unusual. The possibility of widespread low-grade neoplasm such as primary adenocarcinoma of the lungs should be considered, and a lack of interval growth may simply reflect the indolent nature of such a neoplasm.  IMPRESSION:   Smoker - Currently light smoker Pulmonary infiltrates - incidentally found on LDCT. Radiographically stable over 4 months span. Totally asymptomatic. The appearance suggests a nonmalignant process  DISCUSSION: We discussed possible diagnoses and I explained that I think this is most likely not a malignant process. This is likely one of the innumerable noninfectious pneumonitides, perhaps smoking related such as RB-ILD or DIP. We discussed the diagnostic options including surgical biopsy (which is not warranted at this juncture) vs bronchoscopic transbronchial biopsy. I also offered the option  of continuing to follow this over time with the caveat that any development of respiratory symptoms, constitutional  symptoms or progression on Xray studies would warrant a more aggressive diagnostic strategy. She prefers to forgo bronchoscopy @ this time. We discussed in depth the possibility of a smoking related pneumonitis and I emphasized the need for absolute abstinence  PLAN:  CXR today to assess whether infiltrates can be visualized with plain CXR Smoking cessation - counseled in depth CT chest, ILD protocol in four months with office follow up after that to review the results She is to contact us in the interim if she develops any new chest or respiratory symptoms   Merton Border, MD PCCM service

## 2015-11-17 ENCOUNTER — Ambulatory Visit
Admission: RE | Admit: 2015-11-17 | Discharge: 2015-11-17 | Disposition: A | Discharge status: Home / Self Care | Payer: Medicare Other | Source: Ambulatory Visit | Attending: Pulmonary Disease | Admitting: Pulmonary Disease

## 2015-11-17 ENCOUNTER — Ambulatory Visit: Admission: RE | Admit: 2015-11-17 | Payer: Medicare Other | Source: Ambulatory Visit

## 2015-11-17 DIAGNOSIS — J189 Pneumonia, unspecified organism: Secondary | ICD-10-CM | POA: Insufficient documentation

## 2015-11-17 DIAGNOSIS — E042 Nontoxic multinodular goiter: Secondary | ICD-10-CM | POA: Diagnosis not present

## 2015-11-17 DIAGNOSIS — R918 Other nonspecific abnormal finding of lung field: Secondary | ICD-10-CM | POA: Insufficient documentation

## 2015-11-19 ENCOUNTER — Ambulatory Visit (INDEPENDENT_AMBULATORY_CARE_PROVIDER_SITE_OTHER): Payer: Medicare Other | Admitting: Pulmonary Disease

## 2015-11-19 ENCOUNTER — Encounter: Payer: Self-pay | Admitting: Pulmonary Disease

## 2015-11-19 VITALS — BP 138/82 | HR 78 | Ht 61.0 in | Wt 126.8 lb

## 2015-11-19 DIAGNOSIS — F17200 Nicotine dependence, unspecified, uncomplicated: Secondary | ICD-10-CM

## 2015-11-19 DIAGNOSIS — Z720 Tobacco use: Secondary | ICD-10-CM

## 2015-11-19 DIAGNOSIS — J189 Pneumonia, unspecified organism: Secondary | ICD-10-CM

## 2015-11-19 DIAGNOSIS — R918 Other nonspecific abnormal finding of lung field: Secondary | ICD-10-CM

## 2015-11-23 ENCOUNTER — Encounter: Payer: Self-pay | Admitting: Pulmonary Disease

## 2015-11-23 NOTE — Progress Notes (Signed)
PULMONARY OFFICE FOLLOW UP NOTE  Requesting MD/Service: [place] Date of initial consultation: 07/22/15 Reason for consultation: pulmonary infiltrates   Pt Profile:  27 F smoker with history of breast cancer with history of breast cancer who is followed closely by Dr Oliva Bustard. She underwent LDCT 01/29/15 which revealed bilateral ground glass opacities. A repeat LDCT was performed 06/08/15 with similar findings. Referred to pulmonary medicine for further evaluation.   CT chest 06/08/15: Again noted are multiple predominantly ground-glass attenuation lesions throughout the lungs bilaterally, which generally appear stable compared to the prior examination, as discussed above  CT chest 11/17/15: NSC. Now available is CT chest from 2008 which demonstrate similar findings   SUBJ: No new complaints. No major problems since last visit. Continues to smoke a couple of cigs/day  Review of Systems  Constitutional: Negative.   Respiratory: Negative.    OBJ: Filed Vitals:   11/19/15 0931  BP: 138/82  Pulse: 78  Height: 5\' 1"  (1.549 m)  Weight: 126 lb 12.8 oz (57.516 kg)  SpO2: 98%  Room air  Gen: WDWN in NAD HEENT: All WNL Neck: NO LAN, no JVD noted Lungs: full BS, normal percussion note throughout, no adventitious sounds Cardiovascular: Reg rate, normal rhythm, no M noted Abdomen: Soft, NT +BS Ext: no C/C/E Neuro: grossly intact   DATA:  CT chest 11/17/15: .Ground-glass nodularity throughout the lungs, the majority of which appears grossly stable from 11/08/2006. Two pure ground-glass nodules in the right upper lobe appear slightly larger than on baseline examination of 11/08/2006. Indolent adenocarcinoma cannot be excluded.  IMPRESSION:   Smoker - Currently very light smoker Pulmonary infiltrates - incidentally found on LDCT. Radiographically stable over 4 months span. Totally asymptomatic. The appearance suggests a nonmalignant process  DISCUSSION: Given chronicity of these  findings, this is very unlikely to represent malignancy. Unusual pneumonitides such as RBILD or DIP would be considerations. She is totally asymptomatic and wishe to forgo lung biopsy. This is reasonable given readiographic stability over many years  PLAN:  No further radiographic surveillance recommended @ this time Smoking cessation again recommended and discussed F/U PRN  Merton Border, MD PCCM service

## 2015-12-30 ENCOUNTER — Ambulatory Visit: Payer: Self-pay | Admitting: Oncology

## 2015-12-30 ENCOUNTER — Other Ambulatory Visit: Payer: Self-pay

## 2016-01-17 ENCOUNTER — Other Ambulatory Visit: Payer: Self-pay | Admitting: *Deleted

## 2016-01-17 DIAGNOSIS — C50919 Malignant neoplasm of unspecified site of unspecified female breast: Secondary | ICD-10-CM

## 2016-01-20 ENCOUNTER — Inpatient Hospital Stay: Payer: Medicare Other | Attending: Family Medicine

## 2016-01-20 ENCOUNTER — Inpatient Hospital Stay (HOSPITAL_BASED_OUTPATIENT_CLINIC_OR_DEPARTMENT_OTHER): Payer: Medicare Other | Admitting: Family Medicine

## 2016-01-20 ENCOUNTER — Encounter: Payer: Self-pay | Admitting: Family Medicine

## 2016-01-20 VITALS — BP 107/68 | HR 94 | Temp 98.2°F | Wt 120.6 lb

## 2016-01-20 DIAGNOSIS — F17210 Nicotine dependence, cigarettes, uncomplicated: Secondary | ICD-10-CM

## 2016-01-20 DIAGNOSIS — Z170 Estrogen receptor positive status [ER+]: Secondary | ICD-10-CM

## 2016-01-20 DIAGNOSIS — Z85820 Personal history of malignant melanoma of skin: Secondary | ICD-10-CM

## 2016-01-20 DIAGNOSIS — Z79899 Other long term (current) drug therapy: Secondary | ICD-10-CM | POA: Diagnosis not present

## 2016-01-20 DIAGNOSIS — Z923 Personal history of irradiation: Secondary | ICD-10-CM

## 2016-01-20 DIAGNOSIS — Z9223 Personal history of estrogen therapy: Secondary | ICD-10-CM | POA: Diagnosis not present

## 2016-01-20 DIAGNOSIS — E876 Hypokalemia: Secondary | ICD-10-CM

## 2016-01-20 DIAGNOSIS — C50012 Malignant neoplasm of nipple and areola, left female breast: Secondary | ICD-10-CM

## 2016-01-20 DIAGNOSIS — C50919 Malignant neoplasm of unspecified site of unspecified female breast: Secondary | ICD-10-CM

## 2016-01-20 DIAGNOSIS — I10 Essential (primary) hypertension: Secondary | ICD-10-CM | POA: Diagnosis not present

## 2016-01-20 DIAGNOSIS — Z853 Personal history of malignant neoplasm of breast: Secondary | ICD-10-CM | POA: Insufficient documentation

## 2016-01-20 LAB — COMPREHENSIVE METABOLIC PANEL
ALBUMIN: 4.1 g/dL (ref 3.5–5.0)
ALK PHOS: 46 U/L (ref 38–126)
ALT: 10 U/L — AB (ref 14–54)
ANION GAP: 8 (ref 5–15)
AST: 17 U/L (ref 15–41)
BUN: 24 mg/dL — ABNORMAL HIGH (ref 6–20)
CALCIUM: 9.3 mg/dL (ref 8.9–10.3)
CHLORIDE: 102 mmol/L (ref 101–111)
CO2: 30 mmol/L (ref 22–32)
Creatinine, Ser: 0.86 mg/dL (ref 0.44–1.00)
GFR calc Af Amer: >60 mL/min (ref >60)
GFR calc non Af Amer: >60 mL/min (ref >60)
GLUCOSE: 119 mg/dL — AB (ref 65–99)
Potassium: 3.1 mmol/L — ABNORMAL LOW (ref 3.5–5.1)
SODIUM: 140 mmol/L (ref 135–145)
Total Bilirubin: 0.5 mg/dL (ref 0.3–1.2)
Total Protein: 7.3 g/dL (ref 6.5–8.1)

## 2016-01-20 LAB — CBC WITH DIFFERENTIAL/PLATELET
BASOS ABS: 0.1 10*3/uL (ref 0–0.1)
BASOS PCT: 1 %
EOS PCT: 1 %
Eosinophils Absolute: 0.1 10*3/uL (ref 0–0.7)
HEMATOCRIT: 42.4 % (ref 35.0–47.0)
Hemoglobin: 14.5 g/dL (ref 12.0–16.0)
Lymphocytes Relative: 23 %
Lymphs Abs: 2 10*3/uL (ref 1.0–3.6)
MCH: 30.8 pg (ref 26.0–34.0)
MCHC: 34.2 g/dL (ref 32.0–36.0)
MCV: 90 fL (ref 80.0–100.0)
MONO ABS: 0.6 10*3/uL (ref 0.2–0.9)
Monocytes Relative: 7 %
NEUTROS ABS: 6.3 10*3/uL (ref 1.4–6.5)
Neutrophils Relative %: 70 %
PLATELETS: 256 10*3/uL (ref 150–440)
RBC: 4.7 MIL/uL (ref 3.80–5.20)
RDW: 13.8 % (ref 11.5–14.5)
WBC: 9 10*3/uL (ref 3.6–11.0)

## 2016-01-20 NOTE — Progress Notes (Signed)
[place]  [phone]  [phone]     Crystal Mcmahon DOB: 1941/01/25  MR#: [id number]  AOZ#:[id number]  Patient Care Team: [place] Pink, MD as PCP - General (Family Medicine)  CHIEF COMPLAINT:  Chief Complaint  Patient presents with  . Malignant neoplasm of upper-inner quadrant of left female br    INTERVAL HISTORY:  Patient is here for further evaluation and treatment consideration regarding carcinoma of breast, diagnosed in 2008. She has completed 5 years of Arimidex therapy, tolerated well. She continues on Fosamax by PCP following most recent bone density. Patient is having some stress and increase tobacco use due to caring for her husband who has bipolar disorder and has been manic for several months now. She did have an abnormal CT scan of the chest in May 2016. She has been evaluated by pulmonology, Dr. Merton Border, who has located previous CT scans from 2008 for comparison. The comparison of those scans shows that these are not new findings. She denies any fever, chills, nausea, vomiting, diarrhea, cough, shortness of breath, or chest pain. Denies any acute bony pain or fractures. Has been feeling very well overall and denies any other specific complaints.  REVIEW OF SYSTEMS:   Review of Systems  Constitutional: Negative for fever, chills, weight loss, malaise/fatigue and diaphoresis.  HENT: Negative.   Eyes: Negative.   Respiratory: Negative for cough, hemoptysis, sputum production, shortness of breath and wheezing.   Cardiovascular: Negative for chest pain, palpitations, orthopnea, claudication, leg swelling and PND.  Gastrointestinal: Negative for heartburn, nausea, vomiting, abdominal pain, diarrhea, constipation, blood in stool and melena.  Genitourinary: Negative.   Musculoskeletal: Negative.   Skin: Negative.   Neurological: Negative for dizziness, tingling, focal weakness, seizures and weakness.  Endo/Heme/Allergies: Does not  bruise/bleed easily.  Psychiatric/Behavioral: Negative for depression. The patient is not nervous/anxious and does not have insomnia.        Stress related to caring for her husband    As per HPI. Otherwise, a complete review of systems is negatve.  ONCOLOGY HISTORY:   Malignant neoplasm of breast ([place])   10/05/2006 Initial Diagnosis Malignant neoplasm of left breast ([place]), Tis N0 M0, ER positive PR negative   10/22/2006 Surgery Lumpectomy   10/22/2006 -  Radiation Therapy    06/22/2007 - 06/21/2012 Anti-estrogen oral therapy Arimidex    PAST MEDICAL HISTORY: Past Medical History  Diagnosis Date  . Tobacco abuse 01/27/2015  . Tobacco abuse 01/27/2015  . Personal history of tobacco use, presenting hazards to health 05/31/2015  . Cancer ([place])     melanoma  . Hypertension     PAST SURGICAL HISTORY: Past Surgical History  Procedure Laterality Date  . Breast excisional biopsy Left 2008    + radation  . Breast excisional biopsy Right 2001 2010    neg surgical bx's    FAMILY HISTORY Family History  Problem Relation Age of Onset  . Cancer Father     GYNECOLOGIC HISTORY:  No LMP recorded. Patient is postmenopausal.     ADVANCED DIRECTIVES:    HEALTH MAINTENANCE: Social History  Substance Use Topics  . Smoking status: Current Every Day Smoker -- 0.25 packs/day for 32 years    Types: Cigarettes  . Smokeless tobacco: Never Used  . Alcohol Use: No     Colonoscopy:  PAP:  Bone density:April 2011  Mammogram:October 2016  Allergies  Allergen Reactions  . Acetaminophen-Pamabrom Other (See Comments)    Other Reaction: RASH & SWELLING  . Codeine Nausea And  Vomiting  . Midol Pm  [Diphenhydramine-Acetaminophen] Rash    Other Reaction: RASH & SWELLING    Current Outpatient Prescriptions  Medication Sig Dispense Refill  . alendronate (FOSAMAX) 70 MG tablet   1  . amLODipine (NORVASC) 2.5 MG tablet Take by mouth.    . calcium carbonate (OSCAL) 1500 (600 CA) MG TABS tablet  Take 600 mg of elemental calcium by mouth 2 (two) times daily with a meal.    . triamterene-hydrochlorothiazide (DYAZIDE) 37.5-25 MG capsule   1   No current facility-administered medications for this visit.    OBJECTIVE: BP 107/68 mmHg  Pulse 94  Temp(Src) 98.2 F (36.8 C) (Tympanic)  Wt 120 lb 9.5 oz (54.7 kg)   Body mass index is 22.8 kg/(m^2).    ECOG FS:0 - Asymptomatic  General: Well-developed, well-nourished, no acute distress. Eyes: Pink conjunctiva, anicteric sclera. HEENT: Normocephalic, moist mucous membranes, clear oropharnyx. Lungs: Clear to auscultation bilaterally. Heart: Regular rate and rhythm. No rubs, murmurs, or gallops. Abdomen: Soft, nontender, nondistended. No organomegaly noted, normoactive bowel sounds. Breast: Breast palpated in a circular manner in the sitting and supine positions.  No masses or fullness palpated.  Axilla palpated in both positions with no masses or fullness palpated.  Musculoskeletal: No edema, cyanosis, or clubbing. Neuro: Alert, answering all questions appropriately. Cranial nerves grossly intact. Skin: No rashes or petechiae noted. Psych: Normal affect. Lymphatics: No cervical, clavicular, or axillary LAD.   LAB RESULTS:  Appointment on 01/20/2016  Component Date Value Ref Range Status  . WBC 01/20/2016 9.0  3.6 - 11.0 K/uL Final  . RBC 01/20/2016 4.70  3.80 - 5.20 MIL/uL Final  . Hemoglobin 01/20/2016 14.5  12.0 - 16.0 g/dL Final  . HCT 01/20/2016 42.4  35.0 - 47.0 % Final  . MCV 01/20/2016 90.0  80.0 - 100.0 fL Final  . MCH 01/20/2016 30.8  26.0 - 34.0 pg Final  . MCHC 01/20/2016 34.2  32.0 - 36.0 g/dL Final  . RDW 01/20/2016 13.8  11.5 - 14.5 % Final  . Platelets 01/20/2016 256  150 - 440 K/uL Final  . Neutrophils Relative % 01/20/2016 70   Final  . Neutro Abs 01/20/2016 6.3  1.4 - 6.5 K/uL Final  . Lymphocytes Relative 01/20/2016 23   Final  . Lymphs Abs 01/20/2016 2.0  1.0 - 3.6 K/uL Final  . Monocytes Relative  01/20/2016 7   Final  . Monocytes Absolute 01/20/2016 0.6  0.2 - 0.9 K/uL Final  . Eosinophils Relative 01/20/2016 1   Final  . Eosinophils Absolute 01/20/2016 0.1  0 - 0.7 K/uL Final  . Basophils Relative 01/20/2016 1   Final  . Basophils Absolute 01/20/2016 0.1  0 - 0.1 K/uL Final  . Sodium 01/20/2016 140  135 - 145 mmol/L Final  . Potassium 01/20/2016 3.1* 3.5 - 5.1 mmol/L Final  . Chloride 01/20/2016 102  101 - 111 mmol/L Final  . CO2 01/20/2016 30  22 - 32 mmol/L Final  . Glucose, Bld 01/20/2016 119* 65 - 99 mg/dL Final  . BUN 01/20/2016 24* 6 - 20 mg/dL Final  . Creatinine, Ser 01/20/2016 0.86  0.44 - 1.00 mg/dL Final  . Calcium 01/20/2016 9.3  8.9 - 10.3 mg/dL Final  . Total Protein 01/20/2016 7.3  6.5 - 8.1 g/dL Final  . Albumin 01/20/2016 4.1  3.5 - 5.0 g/dL Final  . AST 01/20/2016 17  15 - 41 U/L Final  . ALT 01/20/2016 10* 14 - 54 U/L Final  . Alkaline  Phosphatase 01/20/2016 46  38 - 126 U/L Final  . Total Bilirubin 01/20/2016 0.5  0.3 - 1.2 mg/dL Final  . GFR calc non Af Amer 01/20/2016 >60  >60 mL/min Final  . GFR calc Af Amer 01/20/2016 >60  >60 mL/min Final   Comment: (NOTE) The eGFR has been calculated using the CKD EPI equation. This calculation has not been validated in all clinical situations. eGFR's persistently <60 mL/min signify possible Chronic Kidney Disease.   . Anion gap 01/20/2016 8  5 - 15 Final    STUDIES: No results found.  ASSESSMENT:  Carcinoma of left breast, Tis N0 M0, ER positive PR negative.  PLAN:  1.  Carcinoma of left breast. Tis, N0, M0 tumor.  ER positive, PR negative, status post lumpectomy, radiation therapy in October 2008. Has completed 5 years of arimidex therapy. Bilateral diagnostic mammogram in October 2016 was reported as negative. Clinically there is no evidence of recurrent disease. 2.  Hypokalemia. Hypokalemia likely related to diuretic use for management of HTN. Encouraged increase oral intake of potassium.  3. Abnormal CT  scan of the chest. Patient continues to smoke, approximately 6-7 cigarettes per day and sometimes a half a pack. This recent pelvic CT was performed in September 2016 with stable findings from May 2016. Pulmonologist was able to compare those findings to a previous CT scan from February 2008 which were present and therefore suggest a nonmalignant process as patient is completely asymptomatic. Can continue to monitor. Smoking cessation was again advised and counseled on.  We'll continue to follow in one year.  Patient expressed understanding and was in agreement with this plan. She also understands that She can call clinic at any time with any questions, concerns, or complaints.   Dr. Oliva Bustard was available for consultation and review of plan of care for this patient.    Evlyn Kanner, NP   01/20/2016 11:44 AM

## 2016-01-21 LAB — CANCER ANTIGEN 27.29: CA 27.29: 15.9 U/mL (ref 0.0–38.6)

## 2016-04-03 ENCOUNTER — Encounter: Payer: Self-pay | Admitting: *Deleted

## 2016-04-07 NOTE — Discharge Instructions (Signed)

## 2016-04-12 ENCOUNTER — Ambulatory Visit: Payer: Medicare Other | Admitting: Anesthesiology

## 2016-04-12 ENCOUNTER — Ambulatory Visit
Admission: RE | Admit: 2016-04-12 | Discharge: 2016-04-12 | Disposition: A | Discharge status: Home / Self Care | Payer: Medicare Other | Source: Ambulatory Visit | Attending: Ophthalmology | Admitting: Ophthalmology

## 2016-04-12 ENCOUNTER — Encounter
Admission: RE | Disposition: A | Discharge status: Home / Self Care | Payer: Self-pay | Source: Ambulatory Visit | Attending: Ophthalmology

## 2016-04-12 DIAGNOSIS — Z885 Allergy status to narcotic agent status: Secondary | ICD-10-CM | POA: Insufficient documentation

## 2016-04-12 DIAGNOSIS — Z9889 Other specified postprocedural states: Secondary | ICD-10-CM | POA: Insufficient documentation

## 2016-04-12 DIAGNOSIS — M810 Age-related osteoporosis without current pathological fracture: Secondary | ICD-10-CM | POA: Insufficient documentation

## 2016-04-12 DIAGNOSIS — I10 Essential (primary) hypertension: Secondary | ICD-10-CM | POA: Diagnosis not present

## 2016-04-12 DIAGNOSIS — H2511 Age-related nuclear cataract, right eye: Secondary | ICD-10-CM | POA: Diagnosis not present

## 2016-04-12 DIAGNOSIS — Z79899 Other long term (current) drug therapy: Secondary | ICD-10-CM | POA: Insufficient documentation

## 2016-04-12 DIAGNOSIS — Z853 Personal history of malignant neoplasm of breast: Secondary | ICD-10-CM | POA: Insufficient documentation

## 2016-04-12 DIAGNOSIS — Z888 Allergy status to other drugs, medicaments and biological substances status: Secondary | ICD-10-CM | POA: Insufficient documentation

## 2016-04-12 DIAGNOSIS — Z923 Personal history of irradiation: Secondary | ICD-10-CM | POA: Diagnosis not present

## 2016-04-12 DIAGNOSIS — F17200 Nicotine dependence, unspecified, uncomplicated: Secondary | ICD-10-CM | POA: Diagnosis not present

## 2016-04-12 DIAGNOSIS — Z85820 Personal history of malignant melanoma of skin: Secondary | ICD-10-CM | POA: Diagnosis not present

## 2016-04-12 DIAGNOSIS — M1990 Unspecified osteoarthritis, unspecified site: Secondary | ICD-10-CM | POA: Insufficient documentation

## 2016-04-12 DIAGNOSIS — H269 Unspecified cataract: Secondary | ICD-10-CM | POA: Diagnosis present

## 2016-04-12 DIAGNOSIS — Z9049 Acquired absence of other specified parts of digestive tract: Secondary | ICD-10-CM | POA: Insufficient documentation

## 2016-04-12 HISTORY — DX: Gastro-esophageal reflux disease without esophagitis: K21.9

## 2016-04-12 HISTORY — DX: Adverse effect of unspecified anesthetic, initial encounter: T41.45XA

## 2016-04-12 HISTORY — PX: CATARACT EXTRACTION W/PHACO: SHX586

## 2016-04-12 HISTORY — DX: Other complications of anesthesia, initial encounter: T88.59XA

## 2016-04-12 SURGERY — PHACOEMULSIFICATION, CATARACT, WITH IOL INSERTION
Anesthesia: Monitor Anesthesia Care | Site: Eye | Laterality: Right | Wound class: Clean

## 2016-04-12 MED ORDER — LIDOCAINE HCL (PF) 4 % IJ SOLN
INTRAOCULAR | Status: DC | PRN
  Administered 2016-04-12: 1 mL via OPHTHALMIC

## 2016-04-12 MED ORDER — POVIDONE-IODINE 5 % OP SOLN
1.0000 "application " | OPHTHALMIC | Status: DC | PRN
  Administered 2016-04-12: 1 via OPHTHALMIC

## 2016-04-12 MED ORDER — EPINEPHRINE HCL 1 MG/ML IJ SOLN
INTRAMUSCULAR | Status: DC | PRN
  Administered 2016-04-12: 68 mL via OPHTHALMIC

## 2016-04-12 MED ORDER — ARMC OPHTHALMIC DILATING GEL
1.0000 "application " | OPHTHALMIC | Status: DC | PRN
  Administered 2016-04-12 (×2): 1 via OPHTHALMIC

## 2016-04-12 MED ORDER — NA HYALUR & NA CHOND-NA HYALUR 0.4-0.35 ML IO KIT
PACK | INTRAOCULAR | Status: DC | PRN
  Administered 2016-04-12: 1 mL via INTRAOCULAR

## 2016-04-12 MED ORDER — LACTATED RINGERS IV SOLN: INTRAVENOUS | Status: DC

## 2016-04-12 MED ORDER — CEFUROXIME OPHTHALMIC INJECTION 1 MG/0.1 ML
INJECTION | OPHTHALMIC | Status: DC | PRN
  Administered 2016-04-12: 0.1 mL via INTRACAMERAL

## 2016-04-12 MED ORDER — TIMOLOL MALEATE 0.5 % OP SOLN
OPHTHALMIC | Status: DC | PRN
Start: 2016-04-12 — End: 2016-04-12
  Administered 2016-04-12: 1 [drp] via OPHTHALMIC

## 2016-04-12 MED ORDER — TETRACAINE HCL 0.5 % OP SOLN
1.0000 [drp] | OPHTHALMIC | Status: DC | PRN
  Administered 2016-04-12: 1 [drp] via OPHTHALMIC

## 2016-04-12 MED ORDER — BRIMONIDINE TARTRATE 0.2 % OP SOLN
OPHTHALMIC | Status: DC | PRN
  Administered 2016-04-12: 1 [drp] via OPHTHALMIC

## 2016-04-12 MED ORDER — FENTANYL CITRATE (PF) 100 MCG/2ML IJ SOLN
INTRAMUSCULAR | Status: DC | PRN
  Administered 2016-04-12: 50 ug via INTRAVENOUS

## 2016-04-12 MED ORDER — MIDAZOLAM HCL 2 MG/2ML IJ SOLN
INTRAMUSCULAR | Status: DC | PRN
  Administered 2016-04-12: 2 mg via INTRAVENOUS

## 2016-04-12 SURGICAL SUPPLY — 25 items
CANNULA ANT/CHMB 27GA (MISCELLANEOUS) ×3 IMPLANT
CARTRIDGE ABBOTT (MISCELLANEOUS) IMPLANT
GLOVE SURG LX 7.5 STRW (GLOVE) ×2
GLOVE SURG LX STRL 7.5 STRW (GLOVE) ×1 IMPLANT
GLOVE SURG TRIUMPH 8.0 PF LTX (GLOVE) ×3 IMPLANT
GOWN STRL REUS W/ TWL LRG LVL3 (GOWN DISPOSABLE) ×2 IMPLANT
GOWN STRL REUS W/TWL LRG LVL3 (GOWN DISPOSABLE) ×4
LENS IOL TECNIS ITEC 23.0 (Intraocular Lens) ×3 IMPLANT
MARKER SKIN DUAL TIP RULER LAB (MISCELLANEOUS) ×3 IMPLANT
NDL RETROBULBAR .5 NSTRL (NEEDLE) IMPLANT
NEEDLE FILTER BLUNT 18X 1/2SAF (NEEDLE) ×2
NEEDLE FILTER BLUNT 18X1 1/2 (NEEDLE) ×1 IMPLANT
PACK CATARACT BRASINGTON (MISCELLANEOUS) ×3 IMPLANT
PACK EYE AFTER SURG (MISCELLANEOUS) ×3 IMPLANT
PACK OPTHALMIC (MISCELLANEOUS) ×3 IMPLANT
RING MALYGIN 7.0 (MISCELLANEOUS) IMPLANT
SUT ETHILON 10-0 CS-B-6CS-B-6 (SUTURE)
SUT VICRYL  9 0 (SUTURE)
SUT VICRYL 9 0 (SUTURE) IMPLANT
SUTURE EHLN 10-0 CS-B-6CS-B-6 (SUTURE) IMPLANT
SYR 3ML LL SCALE MARK (SYRINGE) ×3 IMPLANT
SYR 5ML LL (SYRINGE) ×3 IMPLANT
SYR TB 1ML LUER SLIP (SYRINGE) ×3 IMPLANT
WATER STERILE IRR 250ML POUR (IV SOLUTION) ×3 IMPLANT
WIPE NON LINTING 3.25X3.25 (MISCELLANEOUS) ×3 IMPLANT

## 2016-04-12 NOTE — Transfer of Care (Signed)
Immediate Anesthesia Transfer of Care Note  Patient: Crystal Mcmahon  Procedure(s) Performed: Procedure(s): Cataract extraction phaco and intraocular lens placement right eye (Right)  Patient Location: PACU  Anesthesia Type: MAC  Level of Consciousness: awake, alert  and patient cooperative  Airway and Oxygen Therapy: Patient Spontanous Breathing and Patient connected to supplemental oxygen  Post-op Assessment: Post-op Vital signs reviewed, Patient's Cardiovascular Status Stable, Respiratory Function Stable, Patent Airway and No signs of Nausea or vomiting  Post-op Vital Signs: Reviewed and stable  Complications: No apparent anesthesia complications

## 2016-04-12 NOTE — Op Note (Signed)
LOCATION:  [facility]   PREOPERATIVE DIAGNOSIS:    Nuclear sclerotic cataract right eye. H25.11   POSTOPERATIVE DIAGNOSIS:  Nuclear sclerotic cataract right eye.     PROCEDURE:  Phacoemusification with posterior chamber intraocular lens placement of the right eye   LENS:   Implant Name Type Inv. Item Serial No. Manufacturer Lot No. LRB No. Used  LENS IOL DIOP 23.0 - [id number] Intraocular Lens LENS IOL DIOP 23.0 [id number]   Right 1        ULTRASOUND TIME: 20 % of 1 minutes, 13 seconds.  CDE 14.7   SURGEON:  Wyonia Hough, MD   ANESTHESIA:  Topical with tetracaine drops and 2% Xylocaine jelly, augmented with 1% preservative-free intracameral lidocaine.    COMPLICATIONS:  None.   DESCRIPTION OF PROCEDURE:  The patient was identified in the holding room and transported to the operating room and placed in the supine position under the operating microscope.  The right eye was identified as the operative eye and it was prepped and draped in the usual sterile ophthalmic fashion.   A 1 millimeter clear-corneal paracentesis was made at the 12:00 position.  0.5 ml of preservative-free 1% lidocaine was injected into the anterior chamber. The anterior chamber was filled with Viscoat viscoelastic.  A 2.4 millimeter keratome was used to make a near-clear corneal incision at the 9:00 position.  A curvilinear capsulorrhexis was made with a cystotome and capsulorrhexis forceps.  Balanced salt solution was used to hydrodissect and hydrodelineate the nucleus.   Phacoemulsification was then used in stop and chop fashion to remove the lens nucleus and epinucleus.  The remaining cortex was then removed using the irrigation and aspiration handpiece. Provisc was then placed into the capsular bag to distend it for lens placement.  A lens was then injected into the capsular bag.  The remaining viscoelastic was aspirated.   Wounds were hydrated with balanced salt solution.  The anterior  chamber was inflated to a physiologic pressure with balanced salt solution.  No wound leaks were noted. Cefuroxime 0.1 ml of a 10mg /ml solution was injected into the anterior chamber for a dose of 1 mg of intracameral antibiotic at the completion of the case.   Timolol and Brimonidine drops were applied to the eye.  The patient was taken to the recovery room in stable condition without complications of anesthesia or surgery.   Crystal Mcmahon 04/12/2016, 9:25 AM

## 2016-04-12 NOTE — Anesthesia Postprocedure Evaluation (Signed)
Anesthesia Post Note  Patient: Crystal Mcmahon  Procedure(s) Performed: Procedure(s) (LRB): Cataract extraction phaco and intraocular lens placement right eye (Right)  Patient location during evaluation: PACU Anesthesia Type: MAC Level of consciousness: awake and alert Pain management: pain level controlled Vital Signs Assessment: post-procedure vital signs reviewed and stable Respiratory status: spontaneous breathing, nonlabored ventilation, respiratory function stable and patient connected to nasal cannula oxygen Cardiovascular status: stable and blood pressure returned to baseline Anesthetic complications: no    Jowan Skillin C

## 2016-04-12 NOTE — Anesthesia Preprocedure Evaluation (Signed)
Anesthesia Evaluation  Patient identified by MRN, date of birth, ID band Patient awake    Reviewed: Allergy & Precautions, NPO status , Patient's Chart, lab work & pertinent test results  Airway Mallampati: II  TM Distance: >3 FB Neck ROM: Full    Dental no notable dental hx.    Pulmonary neg pulmonary ROS, Current Smoker,    Pulmonary exam normal breath sounds clear to auscultation       Cardiovascular hypertension, Normal cardiovascular exam Rhythm:Regular Rate:Normal     Neuro/Psych  Headaches, negative psych ROS   GI/Hepatic Neg liver ROS, GERD  ,  Endo/Other  negative endocrine ROS  Renal/GU negative Renal ROS  negative genitourinary   Musculoskeletal  (+) Arthritis ,   Abdominal   Peds negative pediatric ROS (+)  Hematology negative hematology ROS (+)   Anesthesia Other Findings   Reproductive/Obstetrics negative OB ROS                             Anesthesia Physical Anesthesia Plan  ASA: II  Anesthesia Plan: MAC   Post-op Pain Management:    Induction: Intravenous  Airway Management Planned:   Additional Equipment:   Intra-op Plan:   Post-operative Plan: Extubation in OR  Informed Consent: I have reviewed the patients History and Physical, chart, labs and discussed the procedure including the risks, benefits and alternatives for the proposed anesthesia with the patient or authorized representative who has indicated his/her understanding and acceptance.   Dental advisory given  Plan Discussed with: CRNA  Anesthesia Plan Comments:         Anesthesia Quick Evaluation

## 2016-04-12 NOTE — H&P (Signed)
  The History and Physical notes are on paper, have been signed, and are to be scanned. The patient remains stable and unchanged from the H&P.   Previous H&P reviewed, patient examined, and there are no changes.  Crystal Mcmahon 04/12/2016 8:32 AM

## 2016-04-13 ENCOUNTER — Encounter: Payer: Self-pay | Admitting: Ophthalmology

## 2016-07-06 ENCOUNTER — Other Ambulatory Visit: Payer: Self-pay | Admitting: Family Medicine

## 2016-07-06 ENCOUNTER — Ambulatory Visit
Admission: RE | Admit: 2016-07-06 | Discharge: 2016-07-06 | Disposition: A | Discharge status: Home / Self Care | Payer: Medicare Other | Source: Ambulatory Visit | Attending: Family Medicine | Admitting: Family Medicine

## 2016-07-06 DIAGNOSIS — Z1231 Encounter for screening mammogram for malignant neoplasm of breast: Secondary | ICD-10-CM

## 2016-07-06 DIAGNOSIS — C50012 Malignant neoplasm of nipple and areola, left female breast: Secondary | ICD-10-CM

## 2016-07-06 HISTORY — DX: Personal history of irradiation: Z92.3

## 2016-07-06 HISTORY — DX: Malignant neoplasm of unspecified site of unspecified female breast: C50.919

## 2017-01-18 ENCOUNTER — Ambulatory Visit: Payer: Medicare Other | Admitting: Oncology

## 2017-01-18 ENCOUNTER — Other Ambulatory Visit: Payer: Medicare Other

## 2017-01-18 ENCOUNTER — Encounter: Payer: Self-pay | Admitting: *Deleted

## 2017-01-18 NOTE — Discharge Instructions (Signed)
Cataract Surgery, Care After °Refer to this sheet in the next few weeks. These instructions provide you with information about caring for yourself after your procedure. Your health care provider may also give you more specific instructions. Your treatment has been planned according to current medical practices, but problems sometimes occur. Call your health care provider if you have any problems or questions after your procedure. °What can I expect after the procedure? °After the procedure, it is common to have: °· Itching. °· Discomfort. °· Fluid discharge. °· Sensitivity to light and to touch. °· Bruising. °Follow these instructions at home: °Eye Care  °· Check your eye every day for signs of infection. Watch for: °¨ Redness, swelling, or pain. °¨ Fluid, blood, or pus. °¨ Warmth. °¨ Bad smell. °Activity  °· Avoid strenuous activities, such as playing contact sports, for as long as told by your health care provider. °· Do not drive or operate heavy machinery until your health care provider approves. °· Do not bend or lift heavy objects . Bending increases pressure in the eye. You can walk, climb stairs, and do light household chores. °· Ask your health care provider when you can return to work. If you work in a dusty environment, you may be advised to wear protective eyewear for a period of time. °General instructions  °· Take or apply over-the-counter and prescription medicines only as told by your health care provider. This includes eye drops. °· Do not touch or rub your eyes. °· If you were given a protective shield, wear it as told by your health care provider. If you were not given a protective shield, wear sunglasses as told by your health care provider to protect your eyes. °· Keep the area around your eye clean and dry. Avoid swimming or allowing water to hit you directly in the face while showering until told by your health care provider. Keep soap and shampoo out of your eyes. °· Do not put a contact lens  into the affected eye or eyes until your health care provider approves. °· Keep all follow-up visits as told by your health care provider. This is important. °Contact a health care provider if: ° °· You have increased bruising around your eye. °· You have pain that is not helped with medicine. °· You have a fever. °· You have redness, swelling, or pain in your eye. °· You have fluid, blood, or pus coming from your incision. °· Your vision gets worse. °Get help right away if: °· You have sudden vision loss. °This information is not intended to replace advice given to you by your health care provider. Make sure you discuss any questions you have with your health care provider. °Document Released: 03/24/2005 Document Revised: 01/13/2016 Document Reviewed: 07/15/2015 °Elsevier Interactive Patient Education © 2017 [employer] ° ° ° ° °General Anesthesia, Adult, Care After °These instructions provide you with information about caring for yourself after your procedure. Your health care provider may also give you more specific instructions. Your treatment has been planned according to current medical practices, but problems sometimes occur. Call your health care provider if you have any problems or questions after your procedure. °What can I expect after the procedure? °After the procedure, it is common to have: °· Vomiting. °· A sore throat. °· Mental slowness. °It is common to feel: °· Nauseous. °· Cold or shivery. °· Sleepy. °· Tired. °· Sore or achy, even in parts of your body where you did not have surgery. °Follow these instructions at   home: °For at least 24 hours after the procedure:  °· Do not: °¨ Participate in activities where you could fall or become injured. °¨ Drive. °¨ Use heavy machinery. °¨ Drink alcohol. °¨ Take sleeping pills or medicines that cause drowsiness. °¨ Make important decisions or sign legal documents. °¨ Take care of children on your own. °· Rest. °Eating and drinking  °· If you vomit, drink  water, juice, or soup when you can drink without vomiting. °· Drink enough fluid to keep your urine clear or pale yellow. °· Make sure you have little or no nausea before eating solid foods. °· Follow the diet recommended by your health care provider. °General instructions  °· Have a responsible adult stay with you until you are awake and alert. °· Return to your normal activities as told by your health care provider. Ask your health care provider what activities are safe for you. °· Take over-the-counter and prescription medicines only as told by your health care provider. °· If you smoke, do not smoke without supervision. °· Keep all follow-up visits as told by your health care provider. This is important. °Contact a health care provider if: °· You continue to have nausea or vomiting at home, and medicines are not helpful. °· You cannot drink fluids or start eating again. °· You cannot urinate after 8-12 hours. °· You develop a skin rash. °· You have fever. °· You have increasing redness at the site of your procedure. °Get help right away if: °· You have difficulty breathing. °· You have chest pain. °· You have unexpected bleeding. °· You feel that you are having a life-threatening or urgent problem. °This information is not intended to replace advice given to you by your health care provider. Make sure you discuss any questions you have with your health care provider. °Document Released: 12/11/2000 Document Revised: 02/07/2016 Document Reviewed: 08/19/2015 °Elsevier Interactive Patient Education © 2017 [employer] ° °

## 2017-01-19 ENCOUNTER — Ambulatory Visit: Payer: Medicare Other | Admitting: Oncology

## 2017-01-19 ENCOUNTER — Other Ambulatory Visit: Payer: Medicare Other

## 2017-01-24 ENCOUNTER — Encounter
Admission: RE | Disposition: A | Discharge status: Home / Self Care | Payer: Self-pay | Source: Ambulatory Visit | Attending: Ophthalmology

## 2017-01-24 ENCOUNTER — Ambulatory Visit: Payer: Medicare Other | Admitting: Anesthesiology

## 2017-01-24 ENCOUNTER — Ambulatory Visit
Admission: RE | Admit: 2017-01-24 | Discharge: 2017-01-24 | Disposition: A | Discharge status: Home / Self Care | Payer: Medicare Other | Source: Ambulatory Visit | Attending: Ophthalmology | Admitting: Ophthalmology

## 2017-01-24 DIAGNOSIS — F17200 Nicotine dependence, unspecified, uncomplicated: Secondary | ICD-10-CM | POA: Diagnosis not present

## 2017-01-24 DIAGNOSIS — I10 Essential (primary) hypertension: Secondary | ICD-10-CM | POA: Diagnosis not present

## 2017-01-24 DIAGNOSIS — K219 Gastro-esophageal reflux disease without esophagitis: Secondary | ICD-10-CM | POA: Insufficient documentation

## 2017-01-24 DIAGNOSIS — H2512 Age-related nuclear cataract, left eye: Secondary | ICD-10-CM | POA: Diagnosis present

## 2017-01-24 HISTORY — PX: CATARACT EXTRACTION W/PHACO: SHX586

## 2017-01-24 SURGERY — PHACOEMULSIFICATION, CATARACT, WITH IOL INSERTION
Anesthesia: Monitor Anesthesia Care | Site: Eye | Laterality: Left | Wound class: Clean

## 2017-01-24 MED ORDER — MIDAZOLAM HCL 2 MG/2ML IJ SOLN
INTRAMUSCULAR | Status: DC | PRN
  Administered 2017-01-24: 2 mg via INTRAVENOUS

## 2017-01-24 MED ORDER — LIDOCAINE HCL (PF) 2 % IJ SOLN
INTRAOCULAR | Status: DC | PRN
  Administered 2017-01-24: 1 mL via INTRAOCULAR

## 2017-01-24 MED ORDER — MOXIFLOXACIN HCL 0.5 % OP SOLN
1.0000 [drp] | OPHTHALMIC | Status: DC | PRN
  Administered 2017-01-24 (×3): 1 [drp] via OPHTHALMIC

## 2017-01-24 MED ORDER — BRIMONIDINE TARTRATE-TIMOLOL 0.2-0.5 % OP SOLN
OPHTHALMIC | Status: DC | PRN
  Administered 2017-01-24: 1 [drp] via OPHTHALMIC

## 2017-01-24 MED ORDER — FENTANYL CITRATE (PF) 100 MCG/2ML IJ SOLN
INTRAMUSCULAR | Status: DC | PRN
  Administered 2017-01-24: 50 ug via INTRAVENOUS

## 2017-01-24 MED ORDER — ARMC OPHTHALMIC DILATING DROPS
1.0000 "application " | OPHTHALMIC | Status: DC | PRN
  Administered 2017-01-24 (×3): 1 via OPHTHALMIC

## 2017-01-24 MED ORDER — NA HYALUR & NA CHOND-NA HYALUR 0.4-0.35 ML IO KIT
PACK | INTRAOCULAR | Status: DC | PRN
  Administered 2017-01-24: 1 mL via INTRAOCULAR

## 2017-01-24 MED ORDER — LACTATED RINGERS IV SOLN
INTRAVENOUS | Status: DC
Start: 2017-01-24 — End: 2017-01-24

## 2017-01-24 MED ORDER — EPINEPHRINE PF 1 MG/ML IJ SOLN
INTRAOCULAR | Status: DC | PRN
  Administered 2017-01-24: 64 mL via OPHTHALMIC

## 2017-01-24 MED ORDER — ARMC OPHTHALMIC DILATING DROPS: 1.0000 "application " | OPHTHALMIC | Status: DC | PRN

## 2017-01-24 MED ORDER — CEFUROXIME OPHTHALMIC INJECTION 1 MG/0.1 ML
INJECTION | OPHTHALMIC | Status: DC | PRN
  Administered 2017-01-24: 0.1 mL via OPHTHALMIC

## 2017-01-24 SURGICAL SUPPLY — 25 items
CANNULA ANT/CHMB 27GA (MISCELLANEOUS) ×3 IMPLANT
CARTRIDGE ABBOTT (MISCELLANEOUS) IMPLANT
GLOVE SURG LX 7.5 STRW (GLOVE) ×2
GLOVE SURG LX STRL 7.5 STRW (GLOVE) ×1 IMPLANT
GLOVE SURG TRIUMPH 8.0 PF LTX (GLOVE) ×3 IMPLANT
GOWN STRL REUS W/ TWL LRG LVL3 (GOWN DISPOSABLE) ×2 IMPLANT
GOWN STRL REUS W/TWL LRG LVL3 (GOWN DISPOSABLE) ×4
LENS IOL TECNIS ITEC 22.5 (Intraocular Lens) ×3 IMPLANT
MARKER SKIN DUAL TIP RULER LAB (MISCELLANEOUS) ×3 IMPLANT
NDL RETROBULBAR .5 NSTRL (NEEDLE) IMPLANT
NEEDLE FILTER BLUNT 18X 1/2SAF (NEEDLE) ×4
NEEDLE FILTER BLUNT 18X1 1/2 (NEEDLE) ×2 IMPLANT
PACK CATARACT BRASINGTON (MISCELLANEOUS) ×3 IMPLANT
PACK EYE AFTER SURG (MISCELLANEOUS) ×3 IMPLANT
PACK OPTHALMIC (MISCELLANEOUS) ×3 IMPLANT
RING MALYGIN 7.0 (MISCELLANEOUS) IMPLANT
SUT ETHILON 10-0 CS-B-6CS-B-6 (SUTURE)
SUT VICRYL  9 0 (SUTURE)
SUT VICRYL 9 0 (SUTURE) IMPLANT
SUTURE EHLN 10-0 CS-B-6CS-B-6 (SUTURE) IMPLANT
SYR 3ML LL SCALE MARK (SYRINGE) ×6 IMPLANT
SYR 5ML LL (SYRINGE) ×3 IMPLANT
SYR TB 1ML LUER SLIP (SYRINGE) ×3 IMPLANT
WATER STERILE IRR 250ML POUR (IV SOLUTION) ×3 IMPLANT
WIPE NON LINTING 3.25X3.25 (MISCELLANEOUS) ×3 IMPLANT

## 2017-01-24 NOTE — Op Note (Signed)
OPERATIVE NOTE  Crystal Mcmahon [id number] 01/24/2017   PREOPERATIVE DIAGNOSIS:  Nuclear sclerotic cataract left eye. H25.12   POSTOPERATIVE DIAGNOSIS:    Nuclear sclerotic cataract left eye.     PROCEDURE:  Phacoemusification with posterior chamber intraocular lens placement of the left eye   LENS:   Implant Name Type Inv. Item Serial No. Manufacturer Lot No. LRB No. Used  LENS IOL DIOP 22.5 - [id number] Intraocular Lens LENS IOL DIOP 22.5 [id number] AMO   Left 1        ULTRASOUND TIME: 17  % of 0 minutes 46 seconds, CDE 8.0  SURGEON:  Wyonia Hough, MD   ANESTHESIA:  Topical with tetracaine drops and 2% Xylocaine jelly, augmented with 1% preservative-free intracameral lidocaine.    COMPLICATIONS:  None.   DESCRIPTION OF PROCEDURE:  The patient was identified in the holding room and transported to the operating room and placed in the supine position under the operating microscope.  The left eye was identified as the operative eye and it was prepped and draped in the usual sterile ophthalmic fashion.   A 1 millimeter clear-corneal paracentesis was made at the 1:30 position.  0.5 ml of preservative-free 1% lidocaine was injected into the anterior chamber.  The anterior chamber was filled with Viscoat viscoelastic.  A 2.4 millimeter keratome was used to make a near-clear corneal incision at the 10:30 position.  .  A curvilinear capsulorrhexis was made with a cystotome and capsulorrhexis forceps.  Balanced salt solution was used to hydrodissect and hydrodelineate the nucleus.   Phacoemulsification was then used in stop and chop fashion to remove the lens nucleus and epinucleus.  The remaining cortex was then removed using the irrigation and aspiration handpiece. Provisc was then placed into the capsular bag to distend it for lens placement.  A lens was then injected into the capsular bag.  The remaining viscoelastic was aspirated.   Wounds were hydrated with balanced salt  solution.  The anterior chamber was inflated to a physiologic pressure with balanced salt solution.  No wound leaks were noted. Cefuroxime 0.1 ml of a 10mg /ml solution was injected into the anterior chamber for a dose of 1 mg of intracameral antibiotic at the completion of the case.   Timolol and Brimonidine drops were applied to the eye.  The patient was taken to the recovery room in stable condition without complications of anesthesia or surgery.  Harshini Trent 01/24/2017, 11:52 AM

## 2017-01-24 NOTE — H&P (Signed)
The History and Physical notes are on paper, have been signed, and are to be scanned. The patient remains stable and unchanged from the H&P.   Previous H&P reviewed, patient examined, and there are no changes.  Crystal Mcmahon 01/24/2017 10:49 AM

## 2017-01-24 NOTE — Anesthesia Preprocedure Evaluation (Signed)
Anesthesia Evaluation  Patient identified by MRN, date of birth, ID band Patient awake    Reviewed: Allergy & Precautions, H&P , NPO status , Patient's Chart, lab work & pertinent test results, reviewed documented beta blocker date and time   History of Anesthesia Complications Negative for: history of anesthetic complications  Airway Mallampati: I  TM Distance: >3 FB Neck ROM: full    Dental  (+) Upper Dentures   Pulmonary Current Smoker,    Pulmonary exam normal breath sounds clear to auscultation       Cardiovascular Exercise Tolerance: Good hypertension, Normal cardiovascular exam Rhythm:regular Rate:Normal     Neuro/Psych negative neurological ROS  negative psych ROS   GI/Hepatic Neg liver ROS, GERD  ,  Endo/Other  negative endocrine ROS  Renal/GU negative Renal ROS  negative genitourinary   Musculoskeletal   Abdominal   Peds  Hematology negative hematology ROS (+)   Anesthesia Other Findings   Reproductive/Obstetrics negative OB ROS                             Anesthesia Physical Anesthesia Plan  ASA: II  Anesthesia Plan: MAC   Post-op Pain Management:    Induction:   Airway Management Planned:   Additional Equipment:   Intra-op Plan:   Post-operative Plan:   Informed Consent: I have reviewed the patients History and Physical, chart, labs and discussed the procedure including the risks, benefits and alternatives for the proposed anesthesia with the patient or authorized representative who has indicated his/her understanding and acceptance.   Dental Advisory Given  Plan Discussed with: CRNA  Anesthesia Plan Comments:         Anesthesia Quick Evaluation

## 2017-01-24 NOTE — Anesthesia Procedure Notes (Signed)
Procedure Name: MAC Performed by: Mayme Genta Pre-anesthesia Checklist: Patient identified, Emergency Drugs available, Suction available, Timeout performed and Patient being monitored Patient Re-evaluated:Patient Re-evaluated prior to inductionOxygen Delivery Method: Nasal cannula Placement Confirmation: positive ETCO2

## 2017-01-24 NOTE — Anesthesia Postprocedure Evaluation (Signed)
Anesthesia Post Note  Patient: Crystal Mcmahon  Procedure(s) Performed: Procedure(s) (LRB): CATARACT EXTRACTION PHACO AND INTRAOCULAR LENS PLACEMENT ([place]) Complicated left (Left)  Patient location during evaluation: PACU Anesthesia Type: MAC Level of consciousness: awake and alert Pain management: pain level controlled Vital Signs Assessment: post-procedure vital signs reviewed and stable Respiratory status: spontaneous breathing, nonlabored ventilation, respiratory function stable and patient connected to nasal cannula oxygen Cardiovascular status: stable and blood pressure returned to baseline Anesthetic complications: no    Trecia Rogers

## 2017-01-24 NOTE — Transfer of Care (Signed)
Immediate Anesthesia Transfer of Care Note  Patient: Crystal Mcmahon  Procedure(s) Performed: Procedure(s) with comments: CATARACT EXTRACTION PHACO AND INTRAOCULAR LENS PLACEMENT ([place]) Complicated left (Left) - Complicated [place]  Patient Location: PACU  Anesthesia Type: MAC  Level of Consciousness: awake, alert  and patient cooperative  Airway and Oxygen Therapy: Patient Spontanous Breathing and Patient connected to supplemental oxygen  Post-op Assessment: Post-op Vital signs reviewed, Patient's Cardiovascular Status Stable, Respiratory Function Stable, Patent Airway and No signs of Nausea or vomiting  Post-op Vital Signs: Reviewed and stable  Complications: No apparent anesthesia complications

## 2017-01-25 ENCOUNTER — Encounter: Payer: Self-pay | Admitting: Ophthalmology

## 2017-02-01 ENCOUNTER — Inpatient Hospital Stay: Payer: Medicare Other | Attending: Oncology | Admitting: Oncology

## 2017-02-01 ENCOUNTER — Inpatient Hospital Stay: Payer: Medicare Other

## 2017-02-01 ENCOUNTER — Encounter: Payer: Self-pay | Admitting: Oncology

## 2017-02-01 VITALS — BP 137/68 | HR 71 | Temp 97.1°F | Resp 18 | Wt 126.5 lb

## 2017-02-01 DIAGNOSIS — K219 Gastro-esophageal reflux disease without esophagitis: Secondary | ICD-10-CM | POA: Diagnosis not present

## 2017-02-01 DIAGNOSIS — Z923 Personal history of irradiation: Secondary | ICD-10-CM | POA: Diagnosis not present

## 2017-02-01 DIAGNOSIS — Z9223 Personal history of estrogen therapy: Secondary | ICD-10-CM | POA: Diagnosis not present

## 2017-02-01 DIAGNOSIS — F17210 Nicotine dependence, cigarettes, uncomplicated: Secondary | ICD-10-CM | POA: Insufficient documentation

## 2017-02-01 DIAGNOSIS — Z85820 Personal history of malignant melanoma of skin: Secondary | ICD-10-CM | POA: Diagnosis not present

## 2017-02-01 DIAGNOSIS — Z170 Estrogen receptor positive status [ER+]: Secondary | ICD-10-CM | POA: Diagnosis not present

## 2017-02-01 DIAGNOSIS — Z86000 Personal history of in-situ neoplasm of breast: Secondary | ICD-10-CM | POA: Insufficient documentation

## 2017-02-01 DIAGNOSIS — Z79899 Other long term (current) drug therapy: Secondary | ICD-10-CM | POA: Insufficient documentation

## 2017-02-01 DIAGNOSIS — C50012 Malignant neoplasm of nipple and areola, left female breast: Secondary | ICD-10-CM

## 2017-02-01 DIAGNOSIS — I10 Essential (primary) hypertension: Secondary | ICD-10-CM | POA: Insufficient documentation

## 2017-02-01 LAB — COMPREHENSIVE METABOLIC PANEL
ALT: 10 U/L — ABNORMAL LOW (ref 14–54)
ANION GAP: 5 (ref 5–15)
AST: 21 U/L (ref 15–41)
Albumin: 3.9 g/dL (ref 3.5–5.0)
Alkaline Phosphatase: 45 U/L (ref 38–126)
BUN: 25 mg/dL — ABNORMAL HIGH (ref 6–20)
CHLORIDE: 102 mmol/L (ref 101–111)
CO2: 31 mmol/L (ref 22–32)
Calcium: 9.3 mg/dL (ref 8.9–10.3)
Creatinine, Ser: 0.91 mg/dL (ref 0.44–1.00)
GFR, EST NON AFRICAN AMERICAN: 59 mL/min — AB (ref >60)
Glucose, Bld: 118 mg/dL — ABNORMAL HIGH (ref 65–99)
POTASSIUM: 3.4 mmol/L — AB (ref 3.5–5.1)
SODIUM: 138 mmol/L (ref 135–145)
Total Bilirubin: 0.7 mg/dL (ref 0.3–1.2)
Total Protein: 7.3 g/dL (ref 6.5–8.1)

## 2017-02-01 LAB — CBC WITH DIFFERENTIAL/PLATELET
Basophils Absolute: 0.1 10*3/uL (ref 0–0.1)
Basophils Relative: 1 %
EOS ABS: 0.1 10*3/uL (ref 0–0.7)
Eosinophils Relative: 1 %
HCT: 39.9 % (ref 35.0–47.0)
Hemoglobin: 14 g/dL (ref 12.0–16.0)
LYMPHS ABS: 2.3 10*3/uL (ref 1.0–3.6)
LYMPHS PCT: 32 %
MCH: 31.7 pg (ref 26.0–34.0)
MCHC: 35.1 g/dL (ref 32.0–36.0)
MCV: 90.3 fL (ref 80.0–100.0)
MONO ABS: 0.6 10*3/uL (ref 0.2–0.9)
Monocytes Relative: 8 %
Neutro Abs: 4.1 10*3/uL (ref 1.4–6.5)
Neutrophils Relative %: 58 %
PLATELETS: 259 10*3/uL (ref 150–440)
RBC: 4.42 MIL/uL (ref 3.80–5.20)
RDW: 13.4 % (ref 11.5–14.5)
WBC: 7.1 10*3/uL (ref 3.6–11.0)

## 2017-02-01 NOTE — Progress Notes (Signed)
Hematology/Oncology Consult note [facility]  [phone] [phone]  Patient Care Team: [place] Pink, MD as PCP - General (Family Medicine)   Name of the patient: Crystal Mcmahon  [id number]  10-15-1940   Date of visit: 02/01/17  Diagnosis- DCIS left breast in 2008  Chief complaint/ Reason for visit- routine f/u  Heme/Onc history: 76 yr old female diagnosed with left breast DCIS in 2008. She is s/p lumpectomy, adjuvant radiation and 5 yrs of hormone therapy with arimidex. She sees [place] for yearly follow up.   Interval history- doing well. Denies any complaints   Review of systems- Review of Systems  Constitutional: Negative for chills, fever, malaise/fatigue and weight loss.  HENT: Negative for congestion, ear discharge and nosebleeds.   Eyes: Negative for blurred vision.  Respiratory: Negative for cough, hemoptysis, sputum production, shortness of breath and wheezing.   Cardiovascular: Negative for chest pain, palpitations, orthopnea and claudication.  Gastrointestinal: Negative for abdominal pain, blood in stool, constipation, diarrhea, heartburn, melena, nausea and vomiting.  Genitourinary: Negative for dysuria, flank pain, frequency, hematuria and urgency.  Musculoskeletal: Negative for back pain, joint pain and myalgias.  Skin: Negative for rash.  Neurological: Negative for dizziness, tingling, focal weakness, seizures, weakness and headaches.  Endo/Heme/Allergies: Does not bruise/bleed easily.  Psychiatric/Behavioral: Negative for depression and suicidal ideas. The patient does not have insomnia.       Allergies  Allergen Reactions  . Codeine Nausea And Vomiting  . Acetaminophen-Pamabrom Swelling and Rash       . Midol Pm [Diphenhydramine-Acetaminophen] Swelling and Rash          Past Medical History:  Diagnosis Date  . Breast cancer ([place]) 2008   left  . Cancer ([place])    melanoma  . Complication of anesthesia      slow to wake up after surgery  . GERD (gastroesophageal reflux disease)   . Hypertension   . Personal history of radiation therapy   . Personal history of tobacco use, presenting hazards to health 05/31/2015  . Tobacco abuse 01/27/2015  . Tobacco abuse 01/27/2015     Past Surgical History:  Procedure Laterality Date  . BREAST EXCISIONAL BIOPSY Left 2008   + radation  . BREAST EXCISIONAL BIOPSY Right 2001 2010   neg surgical bx's  . CATARACT EXTRACTION W/PHACO Right 04/12/2016   Procedure: Cataract extraction phaco and intraocular lens placement right eye;  Surgeon: Leandrew Koyanagi, MD;  Location: [place];  Service: Ophthalmology;  Laterality: Right;  . CATARACT EXTRACTION W/PHACO Left 01/24/2017   Procedure: CATARACT EXTRACTION PHACO AND INTRAOCULAR LENS PLACEMENT ([place]) Complicated left;  Surgeon: Leandrew Koyanagi, MD;  Location: [place];  Service: Ophthalmology;  Laterality: Left;  Complicated [place]  . CHOLECYSTECTOMY    . TUBAL LIGATION      Social History   Social History  . Marital status: Married    Spouse name: N/A  . Number of children: N/A  . Years of education: N/A   Occupational History  . Not on file.   Social History Main Topics  . Smoking status: Current Every Day Smoker    Packs/day: 0.25    Years: 32.00    Types: Cigarettes  . Smokeless tobacco: Never Used  . Alcohol use No     Comment: occassional  . Drug use: No  . Sexual activity: Not on file   Other Topics Concern  . Not on file   Social History Narrative  . No narrative on  file    Family History  Problem Relation Age of Onset  . Pancreatic cancer Father   . Breast cancer Neg Hx      Current Outpatient Prescriptions:  .  alendronate (FOSAMAX) 70 MG tablet, Take 70 mg by mouth once a week. , Disp: , Rfl: 1 .  amLODipine (NORVASC) 2.5 MG tablet, Take 2.5 mg by mouth daily. , Disp: , Rfl:  .  calcium carbonate (OSCAL) 1500 (600 CA) MG TABS tablet, Take 600  mg of elemental calcium by mouth 2 (two) times daily with a meal., Disp: , Rfl:  .  ranitidine (ACID REDUCER) 150 MG tablet, Take 150 mg by mouth daily., Disp: , Rfl:  .  triamterene-hydrochlorothiazide (DYAZIDE) 37.5-25 MG capsule, Take 1 capsule by mouth daily. , Disp: , Rfl: 1  Physical exam:  Vitals:   02/01/17 0904  BP: 137/68  Pulse: 71  Resp: 18  Temp: 97.1 F (36.2 C)  TempSrc: Tympanic  Weight: 126 lb 8 oz (57.4 kg)   Physical Exam  Constitutional: She is oriented to person, place, and time and well-developed, well-nourished, and in no distress.  HENT:  Head: Normocephalic and atraumatic.  Eyes: EOM are normal. Pupils are equal, round, and reactive to light.  Neck: Normal range of motion.  Cardiovascular: Normal rate, regular rhythm and normal heart sounds.   Pulmonary/Chest: Effort normal and breath sounds normal.  Abdominal: Soft. Bowel sounds are normal.  Neurological: She is alert and oriented to person, place, and time.  Skin: Skin is warm and dry.   Breast exam was performed in seated and lying down position. Patient is status post left lumpectomy with a well-healed surgical scar. No evidence of any palpable masses. No evidence of axillary adenopathy. No evidence of any palpable masses or lumps in the right breast. No evidence of right axillary adenopathy   CMP Latest Ref Rng & Units 02/01/2017  Glucose 65 - 99 mg/dL 118(H)  BUN 6 - 20 mg/dL 25(H)  Creatinine 0.44 - 1.00 mg/dL 0.91  Sodium 135 - 145 mmol/L 138  Potassium 3.5 - 5.1 mmol/L 3.4(L)  Chloride 101 - 111 mmol/L 102  CO2 22 - 32 mmol/L 31  Calcium 8.9 - 10.3 mg/dL 9.3  Total Protein 6.5 - 8.1 g/dL 7.3  Total Bilirubin 0.3 - 1.2 mg/dL 0.7  Alkaline Phos 38 - 126 U/L 45  AST 15 - 41 U/L 21  ALT 14 - 54 U/L 10(L)   CBC Latest Ref Rng & Units 02/01/2017  WBC 3.6 - 11.0 K/uL 7.1  Hemoglobin 12.0 - 16.0 g/dL 14.0  Hematocrit 35.0 - 47.0 % 39.9  Platelets 150 - 440 K/uL 259     Assessment and plan-  Patient is a 76 y.o. female with left breast dcis in 2008 who is on yearly surveillance  Recent mammogram from 10/17 was negative for malignancy. Since it has been 10 years since her diagnosis of DCIS, she can continue to f/u with Dr. Kary Kos who can schedule her mammograms and do her breast exam yearly as well. No need for oncology f/u at this time   Visit Diagnosis 1. 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 Randa Evens, MD, MPH [place] [facility] Pager- [phone] 02/01/2017 1:49 PM

## 2017-02-01 NOTE — Progress Notes (Signed)
Here for follow up

## 2017-02-07 ENCOUNTER — Other Ambulatory Visit: Payer: Self-pay | Admitting: Family Medicine

## 2017-02-07 DIAGNOSIS — Z1231 Encounter for screening mammogram for malignant neoplasm of breast: Secondary | ICD-10-CM

## 2017-02-20 IMAGING — MG MM SCREENING BREAST TOMO BILATERAL
9 of 15 series · 9 of 35 positions shown · non-contrast
Comparison: Previous exam(s).

CLINICAL DATA: Screening.

EXAM:
DIGITAL SCREENING BILATERAL MAMMOGRAM WITH 3D TOMO WITH CAD

[R CC]
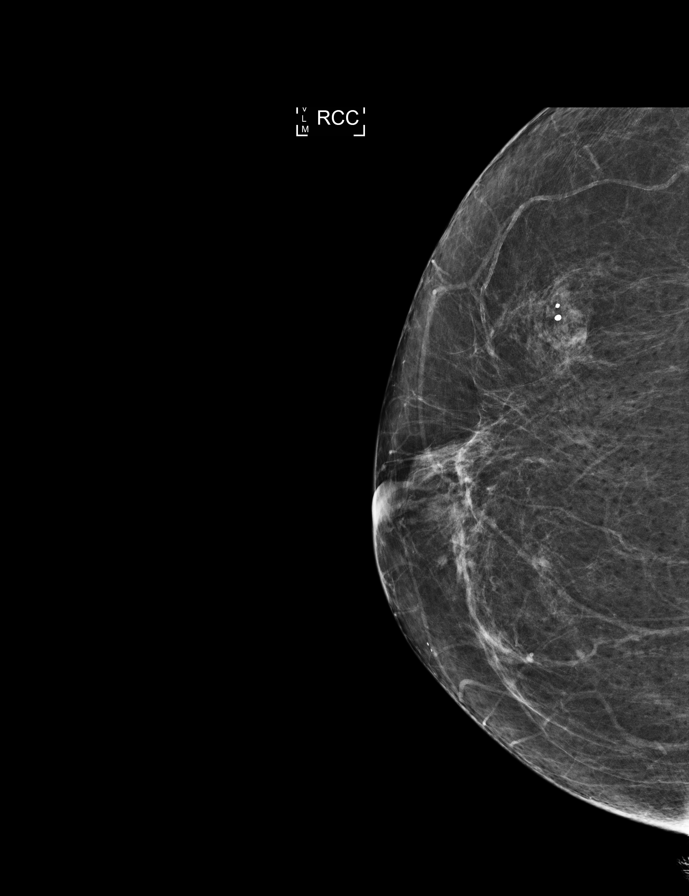

[R CC synth-2D]
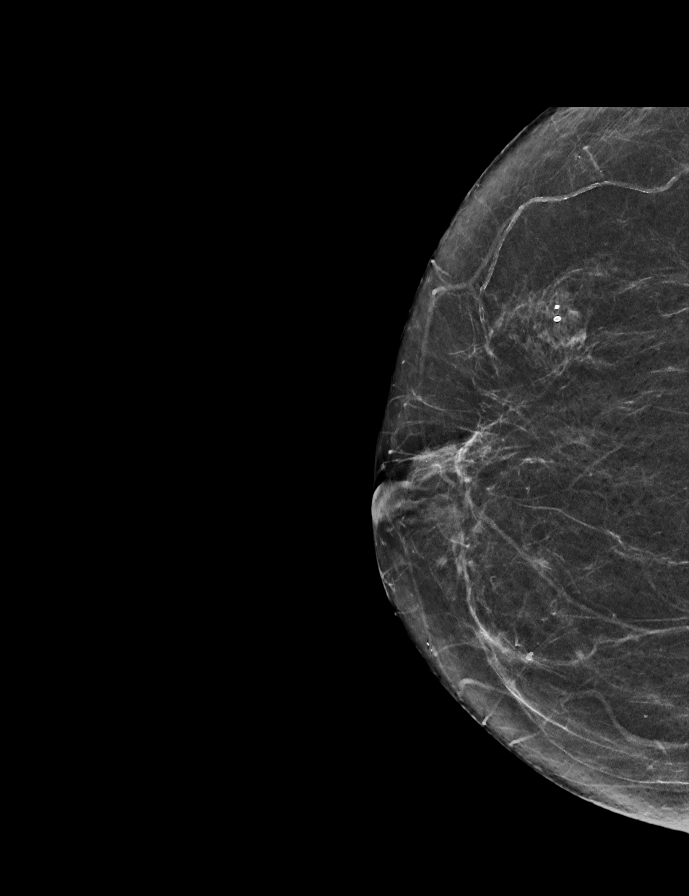

[L MLO]
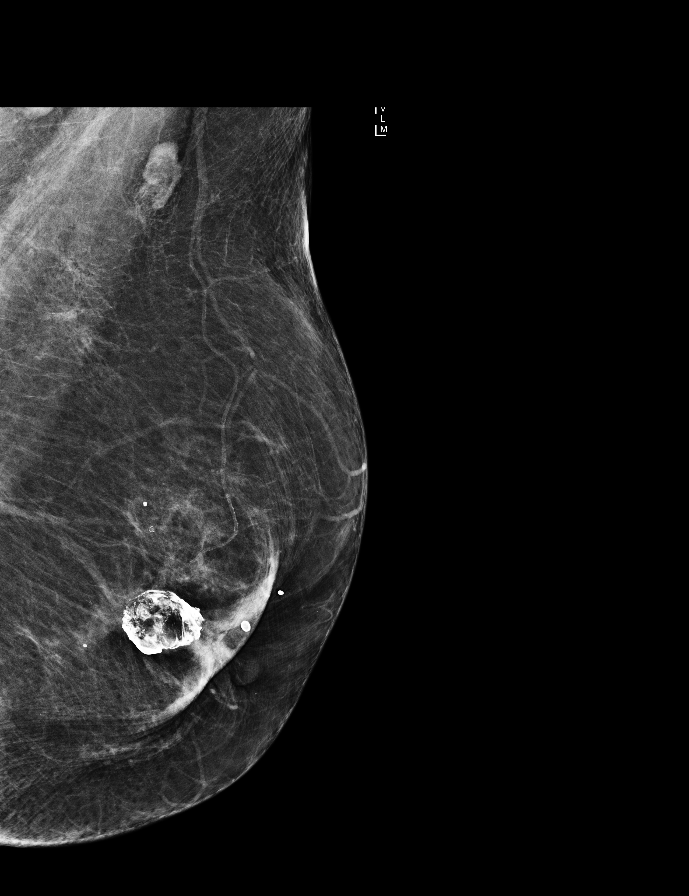

[L MLO synth-2D]
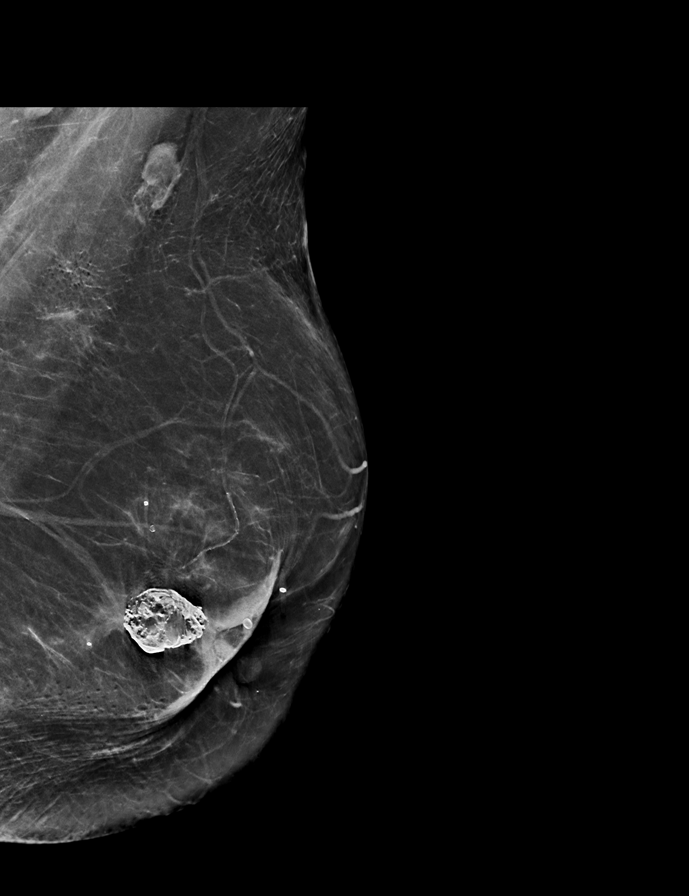

[R MLO (1 of 2)]
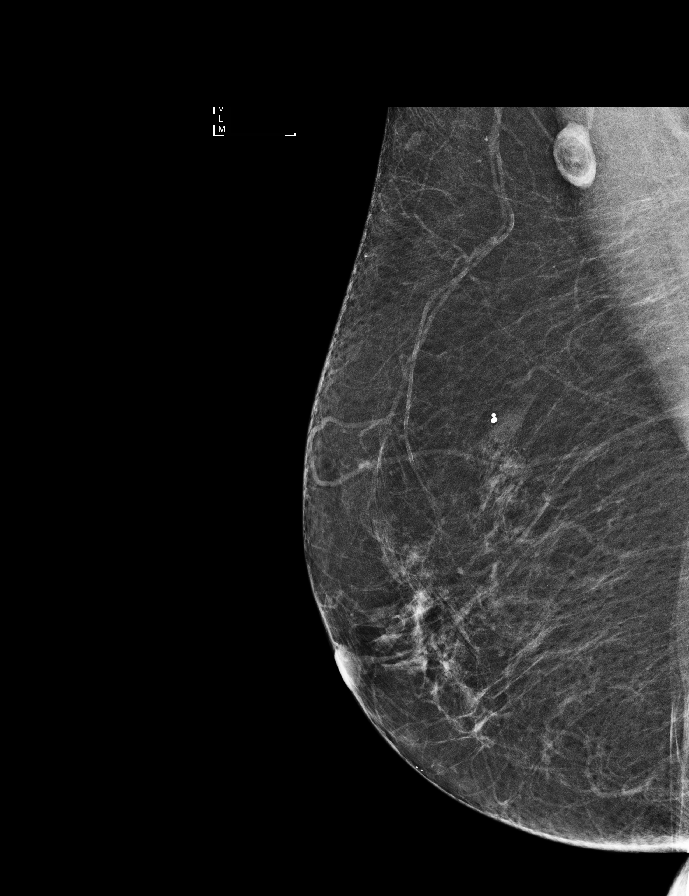

[L CC]
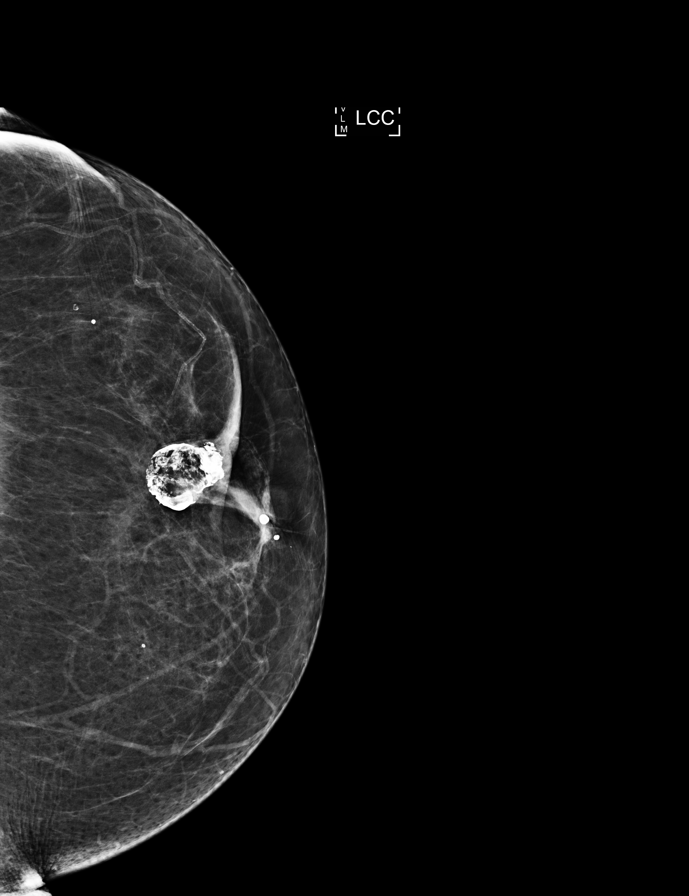

[R MLO synth-2D]
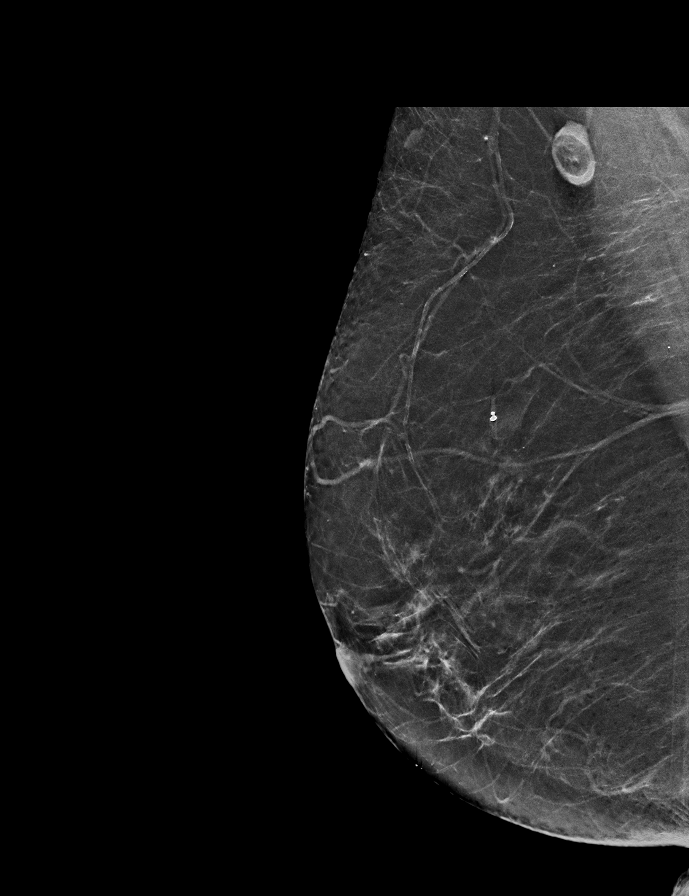

[L CC synth-2D]
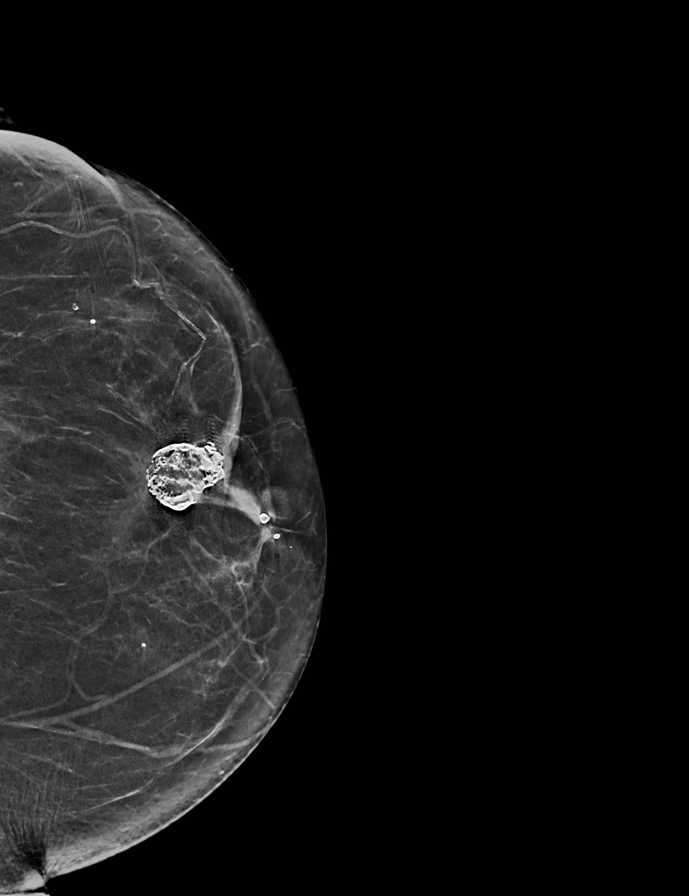

[R MLO (2 of 2)]
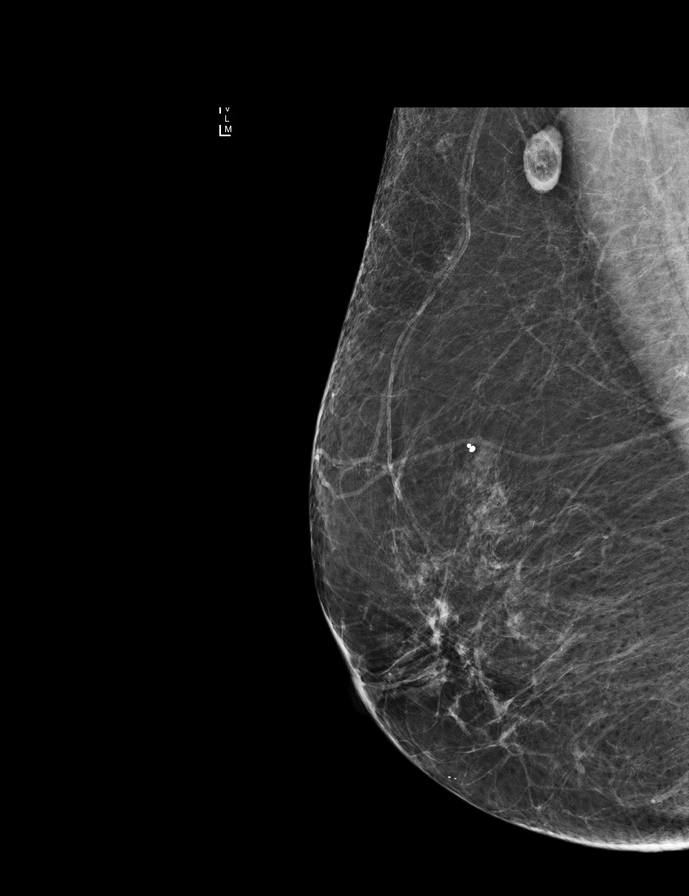

[9 of 35 positions shown; findings below may reference images not displayed]

ACR Breast Density Category b: There are scattered areas of
fibroglandular density.
FINDINGS: There are no findings suspicious for malignancy. Images were
processed with CAD.
IMPRESSION: No mammographic evidence of malignancy. A result letter of this
screening mammogram will be mailed directly to the patient.

RECOMMENDATION:
Screening mammogram in one year. (Code:55-L-23V)

BI-RADS CATEGORY  1: Negative.

## 2017-07-09 ENCOUNTER — Ambulatory Visit
Admission: RE | Admit: 2017-07-09 | Discharge: 2017-07-09 | Disposition: A | Discharge status: Home / Self Care | Payer: Medicare Other | Source: Ambulatory Visit | Attending: Family Medicine | Admitting: Family Medicine

## 2017-07-09 DIAGNOSIS — Z1231 Encounter for screening mammogram for malignant neoplasm of breast: Secondary | ICD-10-CM | POA: Diagnosis present

## 2017-12-18 ENCOUNTER — Other Ambulatory Visit: Payer: Self-pay | Admitting: Family Medicine

## 2017-12-18 DIAGNOSIS — E041 Nontoxic single thyroid nodule: Secondary | ICD-10-CM

## 2017-12-24 ENCOUNTER — Ambulatory Visit
Admission: RE | Admit: 2017-12-24 | Discharge: 2017-12-24 | Disposition: A | Discharge status: Home / Self Care | Payer: Medicare Other | Source: Ambulatory Visit | Attending: Family Medicine | Admitting: Family Medicine

## 2017-12-24 DIAGNOSIS — Z0000 Encounter for general adult medical examination without abnormal findings: Secondary | ICD-10-CM | POA: Insufficient documentation

## 2017-12-24 DIAGNOSIS — E041 Nontoxic single thyroid nodule: Secondary | ICD-10-CM | POA: Diagnosis present

## 2018-06-12 ENCOUNTER — Other Ambulatory Visit: Payer: Self-pay | Admitting: Family Medicine

## 2018-06-12 DIAGNOSIS — Z1231 Encounter for screening mammogram for malignant neoplasm of breast: Secondary | ICD-10-CM

## 2018-07-10 ENCOUNTER — Ambulatory Visit
Admission: RE | Admit: 2018-07-10 | Discharge: 2018-07-10 | Disposition: A | Discharge status: Home / Self Care | Payer: Medicare Other | Source: Ambulatory Visit | Attending: Family Medicine | Admitting: Family Medicine

## 2018-07-10 DIAGNOSIS — Z1231 Encounter for screening mammogram for malignant neoplasm of breast: Secondary | ICD-10-CM | POA: Diagnosis not present

## 2019-12-31 ENCOUNTER — Other Ambulatory Visit: Payer: Self-pay | Admitting: Family Medicine

## 2019-12-31 DIAGNOSIS — Z1231 Encounter for screening mammogram for malignant neoplasm of breast: Secondary | ICD-10-CM

## 2020-01-06 ENCOUNTER — Ambulatory Visit
Admission: RE | Admit: 2020-01-06 | Discharge: 2020-01-06 | Disposition: A | Discharge status: Home / Self Care | Payer: Medicare Other | Source: Ambulatory Visit | Attending: Family Medicine | Admitting: Family Medicine

## 2020-01-06 DIAGNOSIS — Z1231 Encounter for screening mammogram for malignant neoplasm of breast: Secondary | ICD-10-CM | POA: Diagnosis not present

## 2020-04-19 ENCOUNTER — Encounter: Payer: Medicare Other | Admitting: Dermatology

## 2020-06-14 ENCOUNTER — Encounter: Payer: Medicare Other | Admitting: Dermatology

## 2020-06-14 ENCOUNTER — Encounter: Payer: Self-pay | Admitting: Dermatology

## 2020-06-14 ENCOUNTER — Other Ambulatory Visit: Payer: Self-pay

## 2020-06-14 ENCOUNTER — Ambulatory Visit (INDEPENDENT_AMBULATORY_CARE_PROVIDER_SITE_OTHER): Payer: Medicare Other | Admitting: Dermatology

## 2020-06-14 DIAGNOSIS — L565 Disseminated superficial actinic porokeratosis (DSAP): Secondary | ICD-10-CM | POA: Diagnosis not present

## 2020-06-14 DIAGNOSIS — L720 Epidermal cyst: Secondary | ICD-10-CM | POA: Diagnosis not present

## 2020-06-14 DIAGNOSIS — C4491 Basal cell carcinoma of skin, unspecified: Secondary | ICD-10-CM

## 2020-06-14 DIAGNOSIS — L820 Inflamed seborrheic keratosis: Secondary | ICD-10-CM | POA: Diagnosis not present

## 2020-06-14 DIAGNOSIS — C4441 Basal cell carcinoma of skin of scalp and neck: Secondary | ICD-10-CM

## 2020-06-14 DIAGNOSIS — L814 Other melanin hyperpigmentation: Secondary | ICD-10-CM

## 2020-06-14 DIAGNOSIS — L821 Other seborrheic keratosis: Secondary | ICD-10-CM

## 2020-06-14 DIAGNOSIS — D485 Neoplasm of uncertain behavior of skin: Secondary | ICD-10-CM

## 2020-06-14 DIAGNOSIS — D229 Melanocytic nevi, unspecified: Secondary | ICD-10-CM

## 2020-06-14 DIAGNOSIS — Z1283 Encounter for screening for malignant neoplasm of skin: Secondary | ICD-10-CM

## 2020-06-14 DIAGNOSIS — Z85820 Personal history of malignant melanoma of skin: Secondary | ICD-10-CM

## 2020-06-14 DIAGNOSIS — D1800 Hemangioma unspecified site: Secondary | ICD-10-CM

## 2020-06-14 DIAGNOSIS — L578 Other skin changes due to chronic exposure to nonionizing radiation: Secondary | ICD-10-CM

## 2020-06-14 HISTORY — DX: Basal cell carcinoma of skin, unspecified: C44.91

## 2020-06-14 NOTE — Progress Notes (Signed)
Follow-Up Visit   Subjective  Crystal Mcmahon is a 79 y.o. female who presents for the following: Annual Exam (TBSE. Hx MM R lat cheek 02/01/2010.). 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 She also has some bothersome spots on right lower back, right upper arm, forehead and perioral area. The following portions of the chart were reviewed this encounter and updated as appropriate:      Review of Systems:  No other skin or systemic complaints except as noted in HPI or Assessment and Plan.  Objective  Well appearing patient in no apparent distress; mood and affect are within normal limits.  A full examination was performed including scalp, head, eyes, ears, nose, lips, neck, chest, axillae, abdomen, back, buttocks, bilateral upper extremities, bilateral lower extremities, hands, feet, fingers, toes, fingernails, and toenails. All findings within normal limits unless otherwise noted below.  Objective  Arms, Legs: Pink brown scaly macules.  Objective  upper forehead x 1, R upper arm x 2 (3): Erythematous keratotic or waxy stuck-on papule    Objective  Right Lower Back: 1.2 cm Firm subcutaneous nodule with punctum.  Objective  Right Lateral Cheek: Well healed scar with no evidence of recurrence, no lymphadenopathy.   Objective  Posterior Neck: 1.1 cm pink scaly patch     Objective  Left Oral Commissure: Smooth white papule(s).    Assessment & Plan   Skin cancer screening performed today.  Actinic Damage - diffuse scaly erythematous macules with underlying dyspigmentation - Recommend daily broad spectrum sunscreen SPF 30+ to sun-exposed areas, reapply every 2 hours as needed.  - Call for new or changing lesions. Lentigines - Scattered tan macules - Discussed due to sun exposure - Benign, observe - Call for any changes  Seborrheic Keratoses - Stuck-on, waxy, tan-brown papules and plaques  - Discussed benign etiology and prognosis. -  Observe - Call for any changes   Melanocytic Nevi - Tan-brown and/or pink-flesh-colored symmetric macules and papules - Benign appearing on exam today - Observation - Call clinic for new or changing moles - Recommend daily use of broad spectrum spf 30+ sunscreen to sun-exposed areas.   Hemangiomas - Red papules - Discussed benign nature - Observe - Call for any changes   DSAP (disseminated superficial actinic porokeratosis) Arms, Legs  Start Cholesterol-Lovastatin Cream Apply to arms/legs bid dsp 240g 2Rf Sent to [facility].  Inflamed seborrheic keratosis (3) upper forehead x 1, R upper arm x 2  Destruction of lesion - upper forehead x 1, R upper arm x 2  Destruction method: cryotherapy   Informed consent: discussed and consent obtained   Lesion destroyed using liquid nitrogen: Yes   Region frozen until ice ball extended beyond lesion: Yes   Outcome: patient tolerated procedure well with no complications   Post-procedure details: wound care instructions given    Epidermal inclusion cyst Right Lower Back  Cyst with symptoms and/or recent change.  Discussed surgical excision to remove, including resulting scar and possible recurrence.  Patient will schedule for surgery. Pre-op information given. Recommend patient stop taking blood thinner medication (i.e. aspirin) 14 days prior to procedure, or as recommended by PCP/cardiologist.   History of melanoma Right Lateral Cheek  Clear. Observe for recurrence. Call clinic for new or changing lesions.  Recommend regular skin exams, daily broad-spectrum spf 30+ sunscreen use, and photoprotection.     Neoplasm of uncertain behavior of skin Posterior Neck  Skin / nail biopsy Type of biopsy: tangential  Informed consent: discussed and consent obtained   Patient was prepped and draped in usual sterile fashion: Area prepped with alcohol. Anesthesia: the lesion was anesthetized in a standard fashion   Anesthetic:  1%  lidocaine w/ epinephrine 1-100,000 buffered w/ 8.4% NaHCO3 Instrument used: flexible razor blade   Hemostasis achieved with: pressure, aluminum chloride and electrodesiccation   Outcome: patient tolerated procedure well   Post-procedure details: wound care instructions given   Post-procedure details comment:  Ointment and small bandage applied  Destruction of lesion  Destruction method: electrodesiccation and curettage   Informed consent: discussed and consent obtained   Timeout:  patient name, date of birth, surgical site, and procedure verified Curettage performed in three different directions: Yes   Electrodesiccation performed over the curetted area: Yes   Lesion length (cm):  1.1 Lesion width (cm):  1.1 Margin per side (cm):  0.1 Final wound size (cm):  1.3 Hemostasis achieved with:  pressure, aluminum chloride and electrodesiccation Outcome: patient tolerated procedure well with no complications   Post-procedure details: wound care instructions given    Specimen 1 - Surgical pathology Differential Diagnosis: Inflamed SK r/o BCC Check Margins: No 1.1 cm pink scaly patch EDC today  Milia Left Oral Commissure  Acne/Milia surgery - Left Oral Commissure Procedure risks and benefits were discussed with the patient and verbal consent was obtained. Following prep of the skin on the left oral commissure with an alcohol swab, extraction of milia was performed with a comedone extractor following superficial incision made over their surfaces with a #11 blade. Capillary hemostasis was achieved with 20% aluminum chloride solution. Vaseline ointment was applied to each site. The patient tolerated the procedure well.  Return surgery for cyst of the right lower back.  IJamesetta Orleans, CMA, am acting as scribe for Brendolyn Patty, MD .  Documentation: I have reviewed the above documentation for accuracy and completeness, and I agree with the above.  Brendolyn Patty MD

## 2020-06-14 NOTE — Patient Instructions (Addendum)
Wound Care Instructions  1. Cleanse wound gently with soap and water once a day then pat dry with clean gauze. Apply a thing coat of Petrolatum (petroleum jelly, "Vaseline") over the wound (unless you have an allergy to this). We recommend that you use a new, sterile tube of Vaseline. Do not pick or remove scabs. Do not remove the yellow or white "healing tissue" from the base of the wound.  2. Cover the wound with fresh, clean, nonstick gauze and secure with paper tape. You may use Band-Aids in place of gauze and tape if the would is small enough, but would recommend trimming much of the tape off as there is often too much. Sometimes Band-Aids can irritate the skin.  3. You should call the office for your biopsy report after 1 week if you have not already been contacted.  4. If you experience any problems, such as abnormal amounts of bleeding, swelling, significant bruising, significant pain, or evidence of infection, please call the office immediately.   Cryotherapy Aftercare  . Wash gently with soap and water everyday.   Marland Kitchen Apply Vaseline and Band-Aid daily until healed.   Instructions for [facility] Medications  One or more of your medications was sent to the [facility] mail order compounding pharmacy. You will receive an email from them and can purchase the medicine through that link. It will then be mailed to your home at the address you confirmed. If for any reason you do not receive an email from them, please check your spam folder. If you still do not find the email, please let us know. [facility] phone number is [phone].   Pre-Operative Instructions  You are scheduled for a surgical procedure at [facility]. We recommend you read the following instructions. If you have any questions or concerns, please call the office at [phone].  1. Shower and wash the entire body with soap and water the day of your surgery paying special attention to cleansing at  and around the planned surgery site.  2. Avoid aspirin or aspirin containing products at least fourteen (14) days prior to your surgical procedure and for at least one week (7 Days) after your surgical procedure. If you take aspirin on a regular basis for heart disease or history of stroke or for any other reason, we may recommend you continue taking aspirin but please notify us if you take this on a regular basis. Aspirin can cause more bleeding to occur during surgery as well as prolonged bleeding and bruising after surgery.   3. Avoid other nonsteroidal pain medications at least one week prior to surgery and at least one week prior to your surgery. These include medications such as Ibuprofen (Motrin, Advil and Nuprin), Naprosyn, Voltaren, Relafen, etc. If medications are used for therapeutic reasons, please inform us as they can cause increased bleeding or prolonged bleeding during and bruising after surgical procedures.   4. Please advice [place] if you are taking any "blood thinner" medications such as Coumadin or Dipyridamole or Plavix or similar medications. These cause increased bleeding and prolonged bleeding during and bruising after surgical procedures. We may have to consider discontinuing these medications briefly prior to and shortly after your surgery, if safe to do so.   5. Please inform us of all medications you are currently taking. All medications that are taken regularly should be taken the day of surgery as you always do. Nevertheless, we need to be informed of what medications you are taking prior to  surgery to whether they will affect the procedure or cause any complications.   6. Please inform us of any medication allergies. Also inform us of whether you have allergies to Latex or rubber products or whether you have had any adverse reaction to Lidocaine or Epinephrine.  7. Please inform us of any prosthetic or artificial body parts such as artificial heart valve, joint replacements,  etc., or similar condition that might require preoperative antibiotics.   8. We recommend avoidance of alcohol at least two weeks prior to surgery and continued avoidence for at least two weeks after surgery.   9. We recommend discontinuation of tobacco smoking at least two weeks prior to surgery and continued abstinence for at least two weeks after surgery.  10. Do not plan strenuous exercise, strenuous work or strenuous lifting for approximately four weeks after your surgery.   11. We request if you are unable to make your scheduled surgical appointment, please call us at least a week in advance or as soon as you are aware of a problem sot aht we can cancel or reschedule you.   12. You MAKE TAKE TYLENOL (acetaminophen) for pain as it is not a blood thinner.   13. PLEASE PLAN TO BE IN TOWN FOR TWO WEEKS FOLLOWING SURGERY, THIS IS IMPORTANT SO YOU CAN BE CHECKED FOR DRESSING CHANGES, SUTURE REMOVAL AND TO MONITOR FOR POSSIBLE COMPLICATIONS.

## 2020-06-21 ENCOUNTER — Telehealth: Payer: Self-pay

## 2020-06-21 NOTE — Telephone Encounter (Signed)
Advised patient biopsy on the posterior neck was BCC and was treated with EDC at time of biopsy. Will recheck on follow-up appointment.

## 2020-06-21 NOTE — Telephone Encounter (Signed)
-----   Message from Brendolyn Patty, MD sent at 06/21/2020  9:29 AM EDT ----- Skin , posterior neck BASAL CELL CARCINOMA, NODULAR PATTERN  BCC- already treated with EDC at time of biopsy

## 2020-08-09 ENCOUNTER — Other Ambulatory Visit: Payer: Self-pay

## 2020-08-09 ENCOUNTER — Ambulatory Visit (INDEPENDENT_AMBULATORY_CARE_PROVIDER_SITE_OTHER): Payer: Medicare Other | Admitting: Dermatology

## 2020-08-09 DIAGNOSIS — L720 Epidermal cyst: Secondary | ICD-10-CM

## 2020-08-09 NOTE — Progress Notes (Signed)
   Follow-Up Visit   Subjective  Crystal Mcmahon is a 79 y.o. female who presents for the following: Cyst (R lower back, pt presents for excision).  Symptomatic.   The following portions of the chart were reviewed this encounter and updated as appropriate:      Review of Systems:  No other skin or systemic complaints except as noted in HPI or Assessment and Plan.  Objective  Well appearing patient in no apparent distress; mood and affect are within normal limits.  A focused examination was performed including back. Relevant physical exam findings are noted in the Assessment and Plan.  Objective  Right lower back: SQ nodule 1.5 x 1.1cm   Assessment & Plan  Epidermal cyst Right lower back  Skin excision - Right lower back  Lesion length (cm):  1.5 Lesion width (cm):  1.1 Margin per side (cm):  0 Total excision diameter (cm):  1.5 Informed consent: discussed and consent obtained   Timeout: patient name, date of birth, surgical site, and procedure verified   Procedure prep:  Patient was prepped and draped in usual sterile fashion Prep type:  Povidone-iodine Anesthesia: the lesion was anesthetized in a standard fashion   Anesthetic:  1% lidocaine w/ epinephrine 1-100,000 buffered w/ 8.4% NaHCO3 (13cc) Instrument used: #15 blade   Hemostasis achieved with: pressure and electrodesiccation   Outcome: patient tolerated procedure well with no complications    Skin repair - Right lower back Complexity:  Intermediate Final length (cm):  2.3 Reason for type of repair: reduce tension to allow closure, reduce the risk of dehiscence, infection, and necrosis and reduce subcutaneous dead space and avoid a hematoma   Undermining: edges undermined   Subcutaneous layers (deep stitches):  Suture size:  4-0 Suture type: Vicryl (polyglactin 910)   Stitches:  Buried vertical mattress Fine/surface layer approximation (top stitches):  Suture size:  4-0 Suture type: nylon   Stitches:  simple interrupted   Suture removal (days):  7 Hemostasis achieved with: suture Outcome: patient tolerated procedure well with no complications   Post-procedure details: sterile dressing applied and wound care instructions given   Dressing type: pressure dressing (Mupirocin)    Specimen 1 - Surgical pathology Differential Diagnosis: D48.5 Cyst vs other Check Margins: No Cystic pap 1.5 x 1.1cm  Return in about 1 week (around 08/16/2020) for suture removal.   I, Sonya Hupman, RMA, am acting as scribe for Brendolyn Patty, MD . Documentation: I have reviewed the above documentation for accuracy and completeness, and I agree with the above.  Brendolyn Patty MD

## 2020-08-09 NOTE — Patient Instructions (Signed)

## 2020-08-16 ENCOUNTER — Other Ambulatory Visit: Payer: Self-pay

## 2020-08-16 ENCOUNTER — Ambulatory Visit (INDEPENDENT_AMBULATORY_CARE_PROVIDER_SITE_OTHER): Payer: Medicare Other | Admitting: Dermatology

## 2020-08-16 DIAGNOSIS — Z4802 Encounter for removal of sutures: Secondary | ICD-10-CM

## 2020-08-16 DIAGNOSIS — L720 Epidermal cyst: Secondary | ICD-10-CM

## 2020-08-16 NOTE — Progress Notes (Signed)
   Follow-Up Visit   Subjective  Crystal Mcmahon is a 79 y.o. female who presents for the following: suture removal (patient is here today for suture removal of the right lower back ).  The following portions of the chart were reviewed this encounter and updated as appropriate:     Review of Systems:  No other skin or systemic complaints except as noted in HPI or Assessment and Plan.  Objective  Well appearing patient in no apparent distress; mood and affect are within normal limits.  A focused examination was performed including the back . Relevant physical exam findings are noted in the Assessment and Plan.  Objective  Right Lower Back: Incision site is clean, dry and intact   Assessment & Plan  Epidermal cyst Right Lower Back  Encounter for Removal of Sutures - Incision site at the right low back is clean, dry and intact - Wound cleansed, sutures removed, wound cleansed and steri strips applied.  - Will contact patient with pathology results when available. - Patient advised to keep steri-strips dry until they fall off. - Scars remodel for a full year. - Once steri-strips fall off, patient can apply over-the-counter silicone scar cream each night to help with scar remodeling if desired. - Patient advised to call with any concerns or if they notice any new or changing lesions.  Path showed benign cyst   Return for appointment as scheduled.  Luther Redo, CMA, am acting as scribe for Brendolyn Patty, MD .  Documentation: I have reviewed the above documentation for accuracy and completeness, and I agree with the above.  Brendolyn Patty MD

## 2020-08-17 ENCOUNTER — Telehealth: Payer: Self-pay

## 2020-08-17 NOTE — Telephone Encounter (Signed)
Advised patient excision was a benign epidermal inclusion cyst.

## 2020-08-17 NOTE — Telephone Encounter (Signed)
-----   Message from Brendolyn Patty, MD sent at 08/16/2020  7:21 PM EST ----- Skin (M), right lower back EPIDERMAL INCLUSION CYST  Benign Please call pt

## 2021-02-16 ENCOUNTER — Other Ambulatory Visit: Payer: Self-pay | Admitting: Family Medicine

## 2021-02-16 DIAGNOSIS — Z1231 Encounter for screening mammogram for malignant neoplasm of breast: Secondary | ICD-10-CM

## 2021-03-23 ENCOUNTER — Other Ambulatory Visit: Payer: Self-pay

## 2021-03-23 ENCOUNTER — Ambulatory Visit
Admission: RE | Admit: 2021-03-23 | Discharge: 2021-03-23 | Disposition: A | Discharge status: Home / Self Care | Payer: Medicare Other | Source: Ambulatory Visit | Attending: Family Medicine | Admitting: Family Medicine

## 2021-03-23 DIAGNOSIS — Z1231 Encounter for screening mammogram for malignant neoplasm of breast: Secondary | ICD-10-CM | POA: Diagnosis present

## 2021-06-21 ENCOUNTER — Other Ambulatory Visit: Payer: Self-pay

## 2021-06-21 ENCOUNTER — Ambulatory Visit (INDEPENDENT_AMBULATORY_CARE_PROVIDER_SITE_OTHER): Payer: Medicare Other | Admitting: Dermatology

## 2021-06-21 ENCOUNTER — Encounter: Payer: Self-pay | Admitting: Dermatology

## 2021-06-21 DIAGNOSIS — D2262 Melanocytic nevi of left upper limb, including shoulder: Secondary | ICD-10-CM

## 2021-06-21 DIAGNOSIS — Z1283 Encounter for screening for malignant neoplasm of skin: Secondary | ICD-10-CM | POA: Diagnosis not present

## 2021-06-21 DIAGNOSIS — L565 Disseminated superficial actinic porokeratosis (DSAP): Secondary | ICD-10-CM

## 2021-06-21 DIAGNOSIS — L820 Inflamed seborrheic keratosis: Secondary | ICD-10-CM | POA: Diagnosis not present

## 2021-06-21 DIAGNOSIS — Z85828 Personal history of other malignant neoplasm of skin: Secondary | ICD-10-CM | POA: Diagnosis not present

## 2021-06-21 DIAGNOSIS — L570 Actinic keratosis: Secondary | ICD-10-CM

## 2021-06-21 DIAGNOSIS — Z85820 Personal history of malignant melanoma of skin: Secondary | ICD-10-CM | POA: Diagnosis not present

## 2021-06-21 DIAGNOSIS — L853 Xerosis cutis: Secondary | ICD-10-CM

## 2021-06-21 DIAGNOSIS — D229 Melanocytic nevi, unspecified: Secondary | ICD-10-CM

## 2021-06-21 NOTE — Patient Instructions (Addendum)
Cryotherapy Aftercare  Wash gently with soap and water everyday.   Apply Vaseline and Band-Aid daily until healed.   Gentle Skin Care Guide  1. Bathe no more than once a day.  2. Avoid bathing in hot water  3. Use a mild soap like Dove, Vanicream, Cetaphil, CeraVe. Can use Lever 2000 or Cetaphil antibacterial soap  4. Use soap only where you need it. On most days, use it under your arms, between your legs, and on your feet. Let the water rinse other areas unless visibly dirty.  5. When you get out of the bath/shower, use a towel to gently blot your skin dry, don't rub it.  6. While your skin is still a little damp, apply a moisturizing cream such as Vanicream, CeraVe, Cetaphil, Eucerin, Sarna lotion or plain Vaseline Jelly. For hands apply Neutrogena Norwegian Hand Cream or Excipial Hand Cream.  7. Reapply moisturizer any time you start to itch or feel dry.  8. Sometimes using free and clear laundry detergents can be helpful. Fabric softener sheets should be avoided. Downy Free & Gentle liquid, or any liquid fabric softener that is free of dyes and perfumes, it acceptable to use  9. If your doctor has given you prescription creams you may apply moisturizers over them     If you have any questions or concerns for your doctor, please call our main line at [phone] and press option 4 to reach your doctor's medical assistant. If no one answers, please leave a voicemail as directed and we will return your call as soon as possible. Messages left after 4 pm will be answered the following business day.   You may also send us a message via MyChart. We typically respond to MyChart messages within 1-2 business days.  For prescription refills, please ask your pharmacy to contact our office. Our fax number is [phone].  If you have an urgent issue when the clinic is closed that cannot wait until the next business day, you can page your doctor at the number below.    Please note that  while we do our best to be available for urgent issues outside of office hours, we are not available 24/7.   If you have an urgent issue and are unable to reach us, you may choose to seek medical care at your doctor's office, retail clinic, urgent care center, or emergency room.  If you have a medical emergency, please immediately call 911 or go to the emergency department.  Pager Numbers  - Dr. Kowalski: [phone]  - Dr. Moye: [phone]  - Dr. Stewart: [phone]  In the event of inclement weather, please call our main line at [phone] for an update on the status of any delays or closures.  Dermatology Medication Tips: Please keep the boxes that topical medications come in in order to help keep track of the instructions about where and how to use these. Pharmacies typically print the medication instructions only on the boxes and not directly on the medication tubes.   If your medication is too expensive, please contact our office at [phone] option 4 or send us a message through MyChart.   We are unable to tell what your co-pay for medications will be in advance as this is different depending on your insurance coverage. However, we may be able to find a substitute medication at lower cost or fill out paperwork to get insurance to cover a needed medication.   If a prior authorization is required to get your medication   covered by your insurance company, please allow us 1-2 business days to complete this process.  Drug prices often vary depending on where the prescription is filled and some pharmacies may offer cheaper prices.  The website www.goodrx.com contains coupons for medications through different pharmacies. The prices here do not account for what the cost may be with help from insurance (it may be cheaper with your insurance), but the website can give you the price if you did not use any insurance.  - You can print the associated coupon and take it with your  prescription to the pharmacy.  - You may also stop by our office during regular business hours and pick up a GoodRx coupon card.  - If you need your prescription sent electronically to a different pharmacy, notify our office through [facility] MyChart or by phone at [phone] option 4.  

## 2021-06-21 NOTE — Progress Notes (Signed)
Follow-Up Visit   Subjective  Crystal Mcmahon is a 80 y.o. female who presents for the following: TBSE.  Patient has a history of melanoma of the right lateral cheek, 2011, and history of BCC of the posterior neck, 2021. She has DSAP of the arms and legs, no improvement with cholesterol/lovastatin cream. H/o Aks.  Has scaly spot behind L ear that she picks at.  The following portions of the chart were reviewed this encounter and updated as appropriate:       Review of Systems:  No other skin or systemic complaints except as noted in HPI or Assessment and Plan.  Objective  Well appearing patient in no apparent distress; mood and affect are within normal limits.  A full examination was performed including scalp, head, eyes, ears, nose, lips, neck, chest, axillae, abdomen, back, buttocks, bilateral upper extremities, bilateral lower extremities, hands, feet, fingers, toes, fingernails, and toenails. All findings within normal limits unless otherwise noted below.  Posterior neck Well healed scar with no evidence of recurrence.   arms, legs Pink brown scaly macules.    Left Posterior Ear x 1, R upper forehead x 1, R temple x 1 (3) Erythematous keratotic or waxy stuck-on papule    L forearm x 4, R forearm x 2, R nasal dorsum x 1, R hand dorsum x 2, L ear helix x 1, R ankle x 2, L ankle x 1 (13) Pink keratotic macules.  L elbow 6.30mm firm flesh papule, present for years with no change per patient.   Assessment & Plan  History of basal cell carcinoma (BCC) Posterior neck  Clear. Observe for recurrence. Call clinic for new or changing lesions.  Recommend regular skin exams, daily broad-spectrum spf 30+ sunscreen use, and photoprotection.    DSAP (disseminated superficial actinic porokeratosis) arms, legs  No improvement with Lovastatin/Chol Cream.  Observation.  Chronic inherited condition of sun-exposed skin, most commonly arms and legs.  Difficult to treat.  Recommend  photoprotection and regular use of spf 30 or higher sunscreen to prevent worsening of condition and precancerous changes.   Inflamed seborrheic keratosis Left Posterior Ear x 1, R upper forehead x 1, R temple x 1  Vrs HyAKs  Recheck L ear on f/up  Destruction of lesion - Left Posterior Ear x 1, R upper forehead x 1, R temple x 1  Destruction method: cryotherapy   Informed consent: discussed and consent obtained   Lesion destroyed using liquid nitrogen: Yes   Region frozen until ice ball extended beyond lesion: Yes   Outcome: patient tolerated procedure well with no complications   Post-procedure details: wound care instructions given   Additional details:  Prior to procedure, discussed risks of blister formation, small wound, skin dyspigmentation, or rare scar following cryotherapy. Recommend Vaseline ointment to treated areas while healing.   AK (actinic keratosis) (13) L forearm x 4, R forearm x 2, R nasal dorsum x 1, R hand dorsum x 2, L ear helix x 1, R ankle x 2, L ankle x 1  Actinic keratoses are precancerous spots that appear secondary to cumulative UV radiation exposure/sun exposure over time. They are chronic with expected duration over 1 year. A portion of actinic keratoses will progress to squamous cell carcinoma of the skin. It is not possible to reliably predict which spots will progress to skin cancer and so treatment is recommended to prevent development of skin cancer.  Recommend daily broad spectrum sunscreen SPF 30+ to sun-exposed areas, reapply every 2 hours as  needed.  Recommend staying in the shade or wearing long sleeves, sun glasses (UVA+UVB protection) and wide brim hats (4-inch brim around the entire circumference of the hat). Call for new or changing lesions.  Destruction of lesion - L forearm x 4, R forearm x 2, R nasal dorsum x 1, R hand dorsum x 2, L ear helix x 1, R ankle x 2, L ankle x 1  Destruction method: cryotherapy   Informed consent: discussed and  consent obtained   Lesion destroyed using liquid nitrogen: Yes   Region frozen until ice ball extended beyond lesion: Yes   Outcome: patient tolerated procedure well with no complications   Post-procedure details: wound care instructions given   Additional details:  Prior to procedure, discussed risks of blister formation, small wound, skin dyspigmentation, or rare scar following cryotherapy. Recommend Vaseline ointment to treated areas while healing.   Nevus L elbow  Benign-appearing.  Observation.  Call clinic for new or changing moles.  Recommend daily use of broad spectrum spf 30+ sunscreen to sun-exposed areas.   History of Melanoma R lateral cheek, 0.29mm, 2011 - No evidence of recurrence today - Recommend regular full body skin exams - Recommend daily broad spectrum sunscreen SPF 30+ to sun-exposed areas, reapply every 2 hours as needed.  - Call if any new or changing lesions are noted between office visits   Xerosis - diffuse xerotic patches - recommend gentle, hydrating skin care - Samples Eucerin advanced given   Return in about 1 year (around 06/21/2022) for TBSE, Hx melanoma, BCC, AK, DSAP.  Documentation: I have reviewed the above documentation for accuracy and completeness, and I agree with the above.  Brendolyn Patty MD

## 2022-02-16 ENCOUNTER — Other Ambulatory Visit: Payer: Self-pay | Admitting: Family Medicine

## 2022-02-16 DIAGNOSIS — Z1231 Encounter for screening mammogram for malignant neoplasm of breast: Secondary | ICD-10-CM

## 2022-03-24 ENCOUNTER — Ambulatory Visit
Admission: RE | Admit: 2022-03-24 | Discharge: 2022-03-24 | Disposition: A | Discharge status: Home / Self Care | Payer: Medicare Other | Source: Ambulatory Visit | Attending: Family Medicine | Admitting: Family Medicine

## 2022-03-24 DIAGNOSIS — Z1231 Encounter for screening mammogram for malignant neoplasm of breast: Secondary | ICD-10-CM | POA: Diagnosis present

## 2022-06-27 ENCOUNTER — Encounter: Payer: Medicare Other | Admitting: Dermatology

## 2022-06-28 ENCOUNTER — Encounter: Payer: Medicare Other | Admitting: Dermatology

## 2022-11-16 ENCOUNTER — Other Ambulatory Visit: Payer: Self-pay | Admitting: Sports Medicine

## 2022-11-16 DIAGNOSIS — M47812 Spondylosis without myelopathy or radiculopathy, cervical region: Secondary | ICD-10-CM

## 2022-11-16 DIAGNOSIS — M5030 Other cervical disc degeneration, unspecified cervical region: Secondary | ICD-10-CM

## 2022-11-16 DIAGNOSIS — R200 Anesthesia of skin: Secondary | ICD-10-CM

## 2022-11-16 DIAGNOSIS — M79602 Pain in left arm: Secondary | ICD-10-CM

## 2022-11-16 DIAGNOSIS — R29898 Other symptoms and signs involving the musculoskeletal system: Secondary | ICD-10-CM

## 2022-11-21 ENCOUNTER — Other Ambulatory Visit: Payer: Self-pay | Admitting: Sports Medicine

## 2022-11-21 ENCOUNTER — Ambulatory Visit
Admission: RE | Admit: 2022-11-21 | Discharge: 2022-11-21 | Disposition: A | Discharge status: Home / Self Care | Payer: Medicare Other | Source: Ambulatory Visit | Attending: Sports Medicine | Admitting: Sports Medicine

## 2022-11-21 DIAGNOSIS — R200 Anesthesia of skin: Secondary | ICD-10-CM

## 2022-11-21 DIAGNOSIS — M79602 Pain in left arm: Secondary | ICD-10-CM | POA: Diagnosis not present

## 2022-11-21 DIAGNOSIS — M5030 Other cervical disc degeneration, unspecified cervical region: Secondary | ICD-10-CM | POA: Insufficient documentation

## 2022-11-21 DIAGNOSIS — M47812 Spondylosis without myelopathy or radiculopathy, cervical region: Secondary | ICD-10-CM

## 2022-11-21 DIAGNOSIS — R29898 Other symptoms and signs involving the musculoskeletal system: Secondary | ICD-10-CM

## 2022-11-21 MED ORDER — GADOBUTROL 1 MMOL/ML IV SOLN
6.0000 mL | Freq: Once | INTRAVENOUS | Status: AC | PRN
  Administered 2022-11-21: 6 mL via INTRAVENOUS

## 2022-11-23 ENCOUNTER — Other Ambulatory Visit: Payer: Self-pay | Admitting: Sports Medicine

## 2022-11-23 DIAGNOSIS — M5030 Other cervical disc degeneration, unspecified cervical region: Secondary | ICD-10-CM

## 2022-11-23 DIAGNOSIS — M542 Cervicalgia: Secondary | ICD-10-CM

## 2022-11-23 DIAGNOSIS — R200 Anesthesia of skin: Secondary | ICD-10-CM

## 2022-11-23 DIAGNOSIS — M47812 Spondylosis without myelopathy or radiculopathy, cervical region: Secondary | ICD-10-CM

## 2022-11-23 DIAGNOSIS — M79602 Pain in left arm: Secondary | ICD-10-CM

## 2022-11-27 ENCOUNTER — Encounter: Payer: Self-pay | Admitting: Sports Medicine

## 2022-11-27 ENCOUNTER — Other Ambulatory Visit: Payer: Self-pay | Admitting: Sports Medicine

## 2022-11-27 DIAGNOSIS — C3412 Malignant neoplasm of upper lobe, left bronchus or lung: Secondary | ICD-10-CM

## 2022-11-27 DIAGNOSIS — C7951 Secondary malignant neoplasm of bone: Secondary | ICD-10-CM

## 2022-11-27 DIAGNOSIS — M79602 Pain in left arm: Secondary | ICD-10-CM

## 2022-11-28 DIAGNOSIS — C7951 Secondary malignant neoplasm of bone: Secondary | ICD-10-CM | POA: Insufficient documentation

## 2022-11-28 NOTE — Assessment & Plan Note (Signed)
#   MRI cervical spine-March 5th-concerning for metastatic disease to the bone.     History of Melanoma R lateral cheek, 0.68mm, 2011

## 2022-11-28 NOTE — Progress Notes (Unsigned)
[place] NOTE  Patient Care Team: [place] Pink, MD as PCP - General (Family Medicine) Telford Nab, RN as Oncology Nurse Navigator  CHIEF COMPLAINTS/PURPOSE OF CONSULTATION: Metastasis to bone.  Oncology History Overview Note  MARCH 2024- MRi cervical spine- IMPRESSION: 1. Findings compatible with multifocal osseous metastatic disease, as described above. 2. Abnormal pleuroparenchymal thickening/mass at the left lung apex, concerning for Pancoast tumor or less likely metastasis. This malignancy probably involves the left C7-T1 and T1-T2 foramina and encroaches on more inferior left thoracic foramina, incompletely imaged. Recommend dedicated CT of the chest (preferably with contrast) to further evaluate the chest. Also, dedicated MRI of the thoracic spine with contrast could further characterize thoracic extent if clinically warranted.     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 Accompanied by family, cousin.    Pennie Banter 82 y.o.  female with longstanding history of smoking; DCIS; history of superficial early stage melanoma; and stage 1 thyroid cancer has been referred to [place] for further evaluation recommendations for left lung mass noted on imaging.  Patient noted to have worsening left upper extremity pain/weakness since January 2024.  Noted to have pain radiating from the neck down significant weakness of the fingers.  Patient denies any shortness of breath or cough.  Denies any fatigue.  Denies pain anywhere else.  Review of Systems  Constitutional:  Negative for chills, diaphoresis, fever, malaise/fatigue and weight loss.  HENT:  Negative for  nosebleeds and sore throat.   Eyes:  Negative for double vision.  Respiratory:  Negative for cough, hemoptysis, sputum production, shortness of breath and wheezing.   Cardiovascular:  Negative for chest pain, palpitations, orthopnea and leg swelling.  Gastrointestinal:  Negative for abdominal pain, blood in stool, constipation, diarrhea, heartburn, melena, nausea and vomiting.  Genitourinary:  Negative for dysuria, frequency and urgency.  Musculoskeletal:  Positive for neck pain. Negative for back pain and joint pain.  Skin: Negative.  Negative for itching and rash.  Neurological:  Positive for tingling, sensory change and focal weakness. Negative for dizziness, weakness and headaches.  Endo/Heme/Allergies:  Does not bruise/bleed easily.  Psychiatric/Behavioral:  Negative for depression. The patient is not nervous/anxious and does not have insomnia.     MEDICAL HISTORY:  Past Medical History:  Diagnosis Date   Actinic keratosis    Basal cell carcinoma 06/14/2020   posterior neck, [facility]   Breast cancer ([place]) 2008   left   Cancer ([place])    melanoma   Complication of anesthesia    slow to wake up after surgery   GERD (gastroesophageal reflux disease)    Hypertension    Melanoma ([place]) 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 Malignant melanoma, lentigo maligna type. Clark's level II, Breslow's 0.28m   Personal history of radiation therapy    Left breast   Personal history of tobacco use, presenting hazards to health 05/31/2015   Tobacco abuse 01/27/2015   Tobacco abuse 01/27/2015    SURGICAL HISTORY: Past Surgical History:  Procedure Laterality Date   BREAST EXCISIONAL BIOPSY Left 2008   + radation   BREAST EXCISIONAL BIOPSY Right 2001 2010   neg surgical bx's   BREAST LUMPECTOMY Left 2008  w/radiation   CATARACT EXTRACTION W/PHACO Right 04/12/2016   Procedure: Cataract extraction phaco and intraocular lens placement right eye;  Surgeon: Leandrew Koyanagi, MD;  [facility];  Service: Ophthalmology;  Laterality: Right;   CATARACT EXTRACTION W/PHACO Left 01/24/2017   Procedure: CATARACT EXTRACTION PHACO AND INTRAOCULAR LENS PLACEMENT ([place]) Complicated left;  Surgeon: Leandrew Koyanagi, MD;  Location: [facility];  Service: Ophthalmology;  Laterality: Left;  Complicated [place]   CHOLECYSTECTOMY     D&C and hysteroscopy  1996   LEEP  1996   TUBAL LIGATION      SOCIAL HISTORY: Social History   Socioeconomic History   Marital status: Married    Spouse name: Not on file   Number of children: Not on file   Years of education: Not on file   Highest education level: Not on file  Occupational History   Not on file  Tobacco Use   Smoking status: Every Day    Packs/day: 0.25    Years: 32.00    Total pack years: 8.00    Types: Cigarettes   Smokeless tobacco: Never  Substance and Sexual Activity   Alcohol use: No    Alcohol/week: 0.0 standard drinks of alcohol    Comment: occassional   Drug use: No   Sexual activity: Not Currently  Other Topics Concern   Not on file  Social History Narrative   Lives by self; husband in assisted living; Smoker since 51s [college]; rare alcohol. 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 2 children- [place]/ and [place].    Social Determinants of Health   Financial Resource Strain: Not on file  Food Insecurity: No Food Insecurity (11/29/2022)   Hunger Vital Sign    Worried About Running Out of Food in the Last Year: Never true    Ran Out of Food in the Last Year: Never true  Transportation Needs: No Transportation Needs (11/29/2022)   PRAPARE - Hydrologist (Medical): No    Lack of Transportation (Non-Medical): No  Physical Activity: Not on file  Stress: Not on file  Social Connections: Not on file  Intimate Partner Violence: Not At Risk (11/29/2022)   Humiliation, Afraid, Rape, and Kick questionnaire    Fear of Current or Ex-Partner: No    Emotionally Abused: No     Physically Abused: No    Sexually Abused: No    FAMILY HISTORY: Family History  Problem Relation Age of Onset   Pancreatic cancer Father    Breast cancer Neg Hx     ALLERGIES:  is allergic to acetaminophen-pamabrom, codeine, and midol pm [diphenhydramine-apap (sleep)].  MEDICATIONS:  Current Outpatient Medications  Medication Sig Dispense Refill   amLODipine (NORVASC) 2.5 MG tablet Take 2.5 mg by mouth daily.      calcium carbonate (OSCAL) 1500 (600 CA) MG TABS tablet Take 600 mg of elemental calcium by mouth 2 (two) times daily with a meal.     citalopram (CELEXA) 10 MG tablet Take 10 mg by mouth daily.     gabapentin (NEURONTIN) 300 MG capsule Take 300 mg by mouth in the morning, at noon, and at bedtime.     ranitidine (ZANTAC) 150 MG tablet Take 150 mg by mouth daily as needed for heartburn.     triamterene-hydrochlorothiazide (DYAZIDE) 37.5-25 MG capsule Take 1 capsule by mouth daily.   1   No current facility-administered medications for this visit.    PHYSICAL EXAMINATION:   Vitals:   11/29/22 1100  BP: 103/67  Pulse: 87  Resp: 16  Temp: (!) 96.5 F (35.8 C)   Filed Weights   11/29/22 1100  Weight: 123 lb 8 oz (56 kg)   Patient has weakness of the left upper extremity especially hands.  Physical Exam Vitals and nursing note reviewed.  HENT:     Head: Normocephalic and atraumatic.     Mouth/Throat:     Pharynx: Oropharynx is clear.  Eyes:     Extraocular Movements: Extraocular movements intact.     Pupils: Pupils are equal, round, and reactive to light.  Cardiovascular:     Rate and Rhythm: Normal rate and regular rhythm.  Pulmonary:     Comments: Decreased breath sounds bilaterally.  Abdominal:     Palpations: Abdomen is soft.  Musculoskeletal:        General: Normal range of motion.     Cervical back: Normal range of motion.  Skin:    General: Skin is warm.  Neurological:     Mental Status: She is alert and oriented to person, place, and time.   Psychiatric:        Behavior: Behavior normal.        Judgment: Judgment normal.     LABORATORY DATA:  I have reviewed the data as listed Lab Results  Component Value Date   WBC 7.1 02/01/2017   HGB 14.0 02/01/2017   HCT 39.9 02/01/2017   MCV 90.3 02/01/2017   PLT 259 02/01/2017   No results for input(s): "NA", "K", "CL", "CO2", "GLUCOSE", "BUN", "CREATININE", "CALCIUM", "GFRNONAA", "GFRAA", "PROT", "ALBUMIN", "AST", "ALT", "ALKPHOS", "BILITOT", "BILIDIR", "IBILI" in the last 8760 hours.  RADIOGRAPHIC STUDIES: I have personally reviewed the radiological images as listed and agreed with the findings in the report. MR CERVICAL SPINE W WO CONTRAST  Result Date: 11/21/2022 EXAM: MRI CERVICAL SPINE WITHOUT AND WITH CONTRAST TECHNIQUE: Multiplanar and multiecho pulse sequences of the cervical spine, to include the craniocervical junction and cervicothoracic junction, were obtained without and with intravenous contrast. CONTRAST:  59m GADAVIST GADOBUTROL 1 MMOL/ML IV SOLN COMPARISON:  None Available. FINDINGS: Alignment: Mild anterolisthesis of C4 on C5 and C5 on C6. Vertebrae: Multifocal abnormal marrow signal which is T1 hypointense, stir hyperintense and enhancing. This includes near complete marrow replacement of the T2 vertebral body as well as smaller lesions involving the T3, C7 and potentially T1 vertebral bodies. Cord: Normal cord signal. Posterior Fossa, vertebral arteries, paraspinal tissues: The visualized vertebral artery flow voids are maintained. No evidence of acute abnormality in the visualized posterior fossa. Abnormal pleuroparenchymal thickening/mass at the left lung apex, incompletely imaged. Disc levels: C2-C3: No significant disc protrusion, foraminal stenosis, or canal stenosis. C3-C4: Facet arthropathy.  No significant stenosis. C4-C5: Facet arthropathy.  No significant stenosis. C5-C6: Posterior disc osteophyte complex, centric to the left. Left greater than right facet and  uncovertebral hypertrophy. Resulting mild left foraminal stenosis. No significant canal or right foraminal stenosis. C6-C7: Left eccentric posterior disc osteophyte complex and left greater than right facet and uncovertebral hypertrophy. Mild left foraminal stenosis. No significant canal or right foraminal stenosis. C7-T1: Abnormal enhancing soft tissue in the left paraspinal soft tissues which is contiguous with the abnormality at the left lung apex described above. This likely involves the left at C7-T1 foramen (for example see series 16, image 23). Otherwise, canal and right foramen are patent. T1-T2: Abnormal enhancing soft tissue in the left paraspinal soft tissues which is contiguous with the abnormality at the left lung apex described above. This likely involves the left  at T1-T2 foramen (for example see series 16, images 27-29). Otherwise, canal and right foramen are patent. IMPRESSION: 1. Findings compatible with multifocal osseous metastatic disease, as described above. 2. Abnormal pleuroparenchymal thickening/mass at the left lung apex, concerning for Pancoast tumor or less likely metastasis. This malignancy probably involves the left C7-T1 and T1-T2 foramina and encroaches on more inferior left thoracic foramina, incompletely imaged. Recommend dedicated CT of the chest (preferably with contrast) to further evaluate the chest. Also, dedicated MRI of the thoracic spine with contrast could further characterize thoracic extent if clinically warranted. These results will be called to the ordering clinician or representative by the Radiologist Assistant, and communication documented in the PACS or [employer]. Electronically Signed   By: Margaretha Sheffield M.D.   On: 11/21/2022 16:32     Cancer, metastatic to bone [facility]) # MARCH 5th 2024- MRI cervical spine- concerning for metastatic disease to the multiple vertebral bodies/bone; also involvement of the neural foramina; with left apical lung mass.    #I had long discussion that based on imaging findings highly concerning for malignancy.  Differential diagnosis-lung cancer most likely; less likely melanoma/thyroid cancer; less likely breast [patient history of DCIS]. I discussed that based on imaging suggestive of advanced stage malignancy.    # I would recommend further work-up with a PET scan for evaluation of the chest/mediastinum/hilum, and rule out distant metastatic disease. PET scan will help stage cancer.   # Patient need biopsy. The options for biopsy include-interventional radiology guided or based on pulmonary/surgical evaluation-based upon the results of the PET scan.  Will also discuss at tumor conference.  Based on biopsy patient will need systemic therapy/radiation.  Consider Zometa.   # History of THyroid cancer- partial thyroidectomy [s/p endo- Feb 2024]-low clinical concern for recurrence.  # History of Melanoma R lateral cheek, 0.66m, 2011 [Dr.Stewart] -no obvious clinical recurrence.  Again await above workup.  # Active smoker-5 cigarettes a day.  Will discuss smoking counseling further at next visit.  # Neck/left arm pain-secondary malignancy.  MRI-March 2024 no evidence of any cord compression.  Currently on gabapentin'/tylenol prn.  Awaiting to start tramadol [hx: Allergy nausea vomiting].  If intolerance to tramadol; recommend starting Mobic.  #  # Clinical trials: Discussed the EXACT science clinical trial/which involves blood draw-and would not have any therapeutic impact on the patient's treatment.  Discussed with clinical trials nurse.  Patient declined.  Thank you Dr. KCandelaria Stagersfor allowing me to participate in the care of your pleasant patient. Please do not hesitate to contact me with questions or concerns in the interim. Discussed with nurse navigator [facility]   # DISPOSITION: # PET scan # labs- cbc/cmp; CEA; LDH; PT/PTT # follow up with MD;  2-3 days after the PET scan; no labs-  dr.B  # I reviewed  the blood work- with the patient in detail; also reviewed the imaging independently [as summarized above]; and with the patient in detail.    Above plan of care was discussed with patient/family in detail.  My contact information was given to the patient/family.     GCammie Sickle MD 11/29/2022 12:25 PM

## 2022-11-29 ENCOUNTER — Other Ambulatory Visit: Payer: Medicare Other

## 2022-11-29 ENCOUNTER — Other Ambulatory Visit: Payer: Self-pay | Admitting: Sports Medicine

## 2022-11-29 ENCOUNTER — Inpatient Hospital Stay: Payer: Medicare Other | Attending: Internal Medicine | Admitting: Internal Medicine

## 2022-11-29 ENCOUNTER — Inpatient Hospital Stay: Payer: Medicare Other

## 2022-11-29 ENCOUNTER — Encounter: Payer: Self-pay | Admitting: Internal Medicine

## 2022-11-29 ENCOUNTER — Encounter: Payer: Self-pay | Admitting: *Deleted

## 2022-11-29 VITALS — BP 103/67 | HR 87 | Temp 96.5°F | Resp 16 | Ht 62.21 in | Wt 123.5 lb

## 2022-11-29 DIAGNOSIS — Z79899 Other long term (current) drug therapy: Secondary | ICD-10-CM | POA: Insufficient documentation

## 2022-11-29 DIAGNOSIS — Z853 Personal history of malignant neoplasm of breast: Secondary | ICD-10-CM

## 2022-11-29 DIAGNOSIS — Z85820 Personal history of malignant melanoma of skin: Secondary | ICD-10-CM | POA: Diagnosis not present

## 2022-11-29 DIAGNOSIS — F17210 Nicotine dependence, cigarettes, uncomplicated: Secondary | ICD-10-CM | POA: Insufficient documentation

## 2022-11-29 DIAGNOSIS — D499 Neoplasm of unspecified behavior of unspecified site: Secondary | ICD-10-CM

## 2022-11-29 DIAGNOSIS — M79602 Pain in left arm: Secondary | ICD-10-CM | POA: Diagnosis not present

## 2022-11-29 DIAGNOSIS — R918 Other nonspecific abnormal finding of lung field: Secondary | ICD-10-CM | POA: Insufficient documentation

## 2022-11-29 DIAGNOSIS — C7951 Secondary malignant neoplasm of bone: Secondary | ICD-10-CM

## 2022-11-29 DIAGNOSIS — Z9889 Other specified postprocedural states: Secondary | ICD-10-CM

## 2022-11-29 DIAGNOSIS — I10 Essential (primary) hypertension: Secondary | ICD-10-CM | POA: Insufficient documentation

## 2022-11-29 DIAGNOSIS — C3412 Malignant neoplasm of upper lobe, left bronchus or lung: Secondary | ICD-10-CM

## 2022-11-29 LAB — CBC WITH DIFFERENTIAL (CANCER CENTER ONLY)
Abs Immature Granulocytes: 0.05 10*3/uL (ref 0.00–0.07)
Basophils Absolute: 0.1 10*3/uL (ref 0.0–0.1)
Basophils Relative: 1 %
Eosinophils Absolute: 0.1 10*3/uL (ref 0.0–0.5)
Eosinophils Relative: 1 %
HCT: 42.9 % (ref 36.0–46.0)
Hemoglobin: 14 g/dL (ref 12.0–15.0)
Immature Granulocytes: 0 %
Lymphocytes Relative: 15 %
Lymphs Abs: 1.8 10*3/uL (ref 0.7–4.0)
MCH: 30.9 pg (ref 26.0–34.0)
MCHC: 32.6 g/dL (ref 30.0–36.0)
MCV: 94.7 fL (ref 80.0–100.0)
Monocytes Absolute: 0.9 10*3/uL (ref 0.1–1.0)
Monocytes Relative: 8 %
Neutro Abs: 8.9 10*3/uL — ABNORMAL HIGH (ref 1.7–7.7)
Neutrophils Relative %: 75 %
Platelet Count: 319 10*3/uL (ref 150–400)
RBC: 4.53 MIL/uL (ref 3.87–5.11)
RDW: 13 % (ref 11.5–15.5)
WBC Count: 11.8 10*3/uL — ABNORMAL HIGH (ref 4.0–10.5)
nRBC: 0 % (ref 0.0–0.2)

## 2022-11-29 LAB — PROTIME-INR
INR: 0.9 (ref 0.8–1.2)
Prothrombin Time: 12.5 seconds (ref 11.4–15.2)

## 2022-11-29 LAB — CMP (CANCER CENTER ONLY)
ALT: 21 U/L (ref 0–44)
AST: 24 U/L (ref 15–41)
Albumin: 3.6 g/dL (ref 3.5–5.0)
Alkaline Phosphatase: 68 U/L (ref 38–126)
Anion gap: 9 (ref 5–15)
BUN: 21 mg/dL (ref 8–23)
CO2: 28 mmol/L (ref 22–32)
Calcium: 9 mg/dL (ref 8.9–10.3)
Chloride: 100 mmol/L (ref 98–111)
Creatinine: 0.98 mg/dL (ref 0.44–1.00)
GFR, Estimated: 57 mL/min — ABNORMAL LOW (ref >60)
Glucose, Bld: 125 mg/dL — ABNORMAL HIGH (ref 70–99)
Potassium: 3.2 mmol/L — ABNORMAL LOW (ref 3.5–5.1)
Sodium: 137 mmol/L (ref 135–145)
Total Bilirubin: 0.2 mg/dL — ABNORMAL LOW (ref 0.3–1.2)
Total Protein: 7.1 g/dL (ref 6.5–8.1)

## 2022-11-29 LAB — APTT: aPTT: 28 seconds (ref 24–36)

## 2022-11-29 LAB — LACTATE DEHYDROGENASE: LDH: 193 U/L — ABNORMAL HIGH (ref 98–192)

## 2022-11-29 NOTE — Progress Notes (Signed)
Met with patient during initial consult with Dr. Rogue Bussing to review recent imaging results. All questions answered during visit. Reviewed upcoming appts with pt for PET scan and follow up next week. Contact info given and instructed to call with any questions or needs. Pt verbalized understanding. Nothing further needed at this time.

## 2022-11-29 NOTE — Progress Notes (Signed)
Left arm pain with finger numbness which led to her diagnosis of multifocal osseous metastatic disease.  The pain wakes her up at night, 3/10 pain scale.  Picked up Tramadol from pharmacy today but is afraid to take due to Codeine allergy.

## 2022-12-01 LAB — CEA: CEA: 5.4 ng/mL — ABNORMAL HIGH (ref 0.0–4.7)

## 2022-12-04 ENCOUNTER — Ambulatory Visit
Admission: RE | Admit: 2022-12-04 | Discharge: 2022-12-04 | Disposition: A | Discharge status: Home / Self Care | Payer: Medicare Other | Source: Ambulatory Visit | Attending: Internal Medicine | Admitting: Internal Medicine

## 2022-12-04 VITALS — Ht 61.0 in

## 2022-12-04 DIAGNOSIS — C7951 Secondary malignant neoplasm of bone: Secondary | ICD-10-CM | POA: Insufficient documentation

## 2022-12-04 DIAGNOSIS — S2241XA Multiple fractures of ribs, right side, initial encounter for closed fracture: Secondary | ICD-10-CM | POA: Insufficient documentation

## 2022-12-04 DIAGNOSIS — X58XXXA Exposure to other specified factors, initial encounter: Secondary | ICD-10-CM | POA: Diagnosis not present

## 2022-12-04 DIAGNOSIS — R918 Other nonspecific abnormal finding of lung field: Secondary | ICD-10-CM | POA: Insufficient documentation

## 2022-12-04 LAB — GLUCOSE, CAPILLARY: Glucose-Capillary: 126 mg/dL — ABNORMAL HIGH (ref 70–99)

## 2022-12-04 MED ORDER — FLUDEOXYGLUCOSE F - 18 (FDG) INJECTION
6.4000 | Freq: Once | INTRAVENOUS | Status: AC | PRN
  Administered 2022-12-04: 6.83 via INTRAVENOUS

## 2022-12-06 ENCOUNTER — Inpatient Hospital Stay: Payer: Medicare Other

## 2022-12-07 ENCOUNTER — Encounter: Payer: Self-pay | Admitting: *Deleted

## 2022-12-07 ENCOUNTER — Ambulatory Visit
Admission: RE | Admit: 2022-12-07 | Discharge: 2022-12-07 | Disposition: A | Discharge status: Home / Self Care | Payer: Medicare Other | Source: Ambulatory Visit | Attending: Radiation Oncology | Admitting: Radiation Oncology

## 2022-12-07 ENCOUNTER — Encounter: Payer: Self-pay | Admitting: Radiation Oncology

## 2022-12-07 ENCOUNTER — Inpatient Hospital Stay (HOSPITAL_BASED_OUTPATIENT_CLINIC_OR_DEPARTMENT_OTHER): Payer: Medicare Other | Admitting: Internal Medicine

## 2022-12-07 VITALS — BP 118/58 | HR 95 | Temp 97.2°F | Resp 16 | Wt 123.0 lb

## 2022-12-07 DIAGNOSIS — C7951 Secondary malignant neoplasm of bone: Secondary | ICD-10-CM

## 2022-12-07 DIAGNOSIS — M810 Age-related osteoporosis without current pathological fracture: Secondary | ICD-10-CM

## 2022-12-07 DIAGNOSIS — Z510 Encounter for antineoplastic radiation therapy: Secondary | ICD-10-CM | POA: Insufficient documentation

## 2022-12-07 DIAGNOSIS — M899 Disorder of bone, unspecified: Secondary | ICD-10-CM | POA: Diagnosis not present

## 2022-12-07 DIAGNOSIS — C419 Malignant neoplasm of bone and articular cartilage, unspecified: Secondary | ICD-10-CM

## 2022-12-07 DIAGNOSIS — F17210 Nicotine dependence, cigarettes, uncomplicated: Secondary | ICD-10-CM | POA: Insufficient documentation

## 2022-12-07 DIAGNOSIS — C3412 Malignant neoplasm of upper lobe, left bronchus or lung: Secondary | ICD-10-CM | POA: Insufficient documentation

## 2022-12-07 DIAGNOSIS — R918 Other nonspecific abnormal finding of lung field: Secondary | ICD-10-CM

## 2022-12-07 MED ORDER — ONDANSETRON HCL 8 MG PO TABS: ORAL_TABLET | ORAL | 1 refills | Status: DC

## 2022-12-07 MED ORDER — MELOXICAM 7.5 MG PO TABS: 7.5000 mg | ORAL_TABLET | Freq: Every day | ORAL | 1 refills | Status: DC

## 2022-12-07 MED ORDER — DEXAMETHASONE 4 MG PO TABS: ORAL_TABLET | ORAL | 0 refills | Status: DC

## 2022-12-07 NOTE — Progress Notes (Signed)
Met with patient during follow up visit with Dr. Rogue Bussing to review PET scan results and discuss biopsy planning. All questions answered during visit. Reviewed upcoming appts. Informed that will call her later this afternoon once her biopsy has been planned. Nothing further needed at this time. Instructed pt to call with any questions or needs. Pt verbalized understanding.

## 2022-12-07 NOTE — Assessment & Plan Note (Addendum)
#   MARCH 5th 2024- MRI cervical spine- concerning for metastatic disease to the multiple vertebral bodies/bone; also involvement of the neural foramina; with left apical lung mass. PET March, 19th, [id number]-  Hypermetabolic soft tissue mass of the left lung apex with associated hypermetabolic left apical pleural thickening, compatible with primary lung malignancy. Enlarged and hypermetabolic mediastinal and left supraclavicular lymph nodes, compatible with metastatic disease. Hypermetabolic osseous metastatic disease of the lower cervical and upper thoracic spine. Hypermetabolic subcutaneous soft tissue nodule of the right lower back, concerning for soft tissue metastatic disease. Numerous ground-glass nodular opacities are seen in the bilateral upper lobes, largest nodules demonstrate minimal FDG uptake and are increased in size when compared with November 17, 2015 prior, likely due to multifocal indolent primary lung adenocarcinoma. Multiple hypermetabolic anterolateral right rib lesions which correlate with rib fractures, likely posttraumatic.   #I had long discussion that based on imaging findings highly concerning for malignancy-highly concerning for lung cancer in the context of her smoking. Less likely melanoma/thyroid cancer; less likely breast [patient history of DCIS].  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  Discussed that unfortunately her malignancy is incurable.  Based on biopsy patient will need systemic therapy/radiation.    # Patient need biopsy. The options for biopsy include-interventional radiology guided or bronchoscopy with pulmonary.  Will discuss with IR regarding left supraclavicular lymph node.  Will also discuss at tumor conference.    # History of THyroid cancer- partial thyroidectomy [s/p endo- Feb 2024]-low clinical concern for recurrence.  # History of Melanoma R lateral cheek, 0.51mm, 2011 [Dr.Stewart] -no obvious clinical recurrence.   Again await above workup.  # Active smoker-5 cigarettes a day.  Will discuss smoking counseling further at next visit.  # Neck/left arm pain-secondary malignancy.  Cervical spine MRI-March 2024 no evidence of any cord compression.  Currently on gabapentin'/tylenol prn.  Patient did not start tramadol [hx: Allergy nausea vomiting].  Recommend Mobic 7.5 mg.  Also recommend dexamethasone 4 mg- take 1 pill 3 times a day for 1 week.  And then 1 pill twice a day for 1 week; and then 1 pill a day.  I also discussed the role of Zometa to decrease skeletal related events [pain; fractures; need for radiation; hypercalcemia].  Discussed the potential side effects including but not limited to-infusion reaction; hypocalcemia; and Osteo-necrosis of jaw. Recommend ca+Vit D BID.  Zometa ordered for next visit.  Discussed with nurse navigator [facility].  Recommend canceling the appointment for CT scan ordered by orthopedics for tomorrow.  Okay to keep the MRI of the thoracic spine.  Patient will need MRI brain down the line.  Patient does not have any CNS symptoms at this time.  # DISPOSITION: # Refer to Dr. Donella Stade regarding-bone metastases- ASAP # follow up on April 5th-   with MD- labs- cbc/cmp; Vit D 25-OH levels; possible zometa -  dr.B  # I reviewed the blood work- with the patient in detail; also reviewed the imaging independently [as summarized above]; and with the patient in detail.

## 2022-12-07 NOTE — Consult Note (Signed)
NEW PATIENT EVALUATION  Name: Crystal Mcmahon  MRN: [id number]  Date:   12/07/2022     DOB: [birth year]   This 82 y.o. 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  REFERRING PHYSICIAN: Maryland Pink, MD  CHIEF COMPLAINT:  Chief Complaint  Patient presents with   bone mets left arm    DIAGNOSIS: The encounter diagnosis was Malignant neoplasm of bone with metastases ([place]).   PREVIOUS INVESTIGATIONS:  PET scan MRI scans CT scans all reviewed Pathology pending probable supraclavicular nodal biopsy Clinical notes reviewed  HPI: Patient is a 82 year old female who presented with increasing pain in her left upper extremity.  She has a history of melanoma stage I and thyroid cancer.  She was found to have a left apical lung mass.  MRI of her cervical spine was compatible with multifocal osseous metastatic disease.  She also had what was concerning for a Pancoast tumor of the left lung apex.  PET scan demonstrated hypermetabolic mass in the left lung apex with hypermetabolic left apical pleural thickening compatible with primary lung malignancy she also had enlarged and hypermetabolic mediastinal and left supraclavicular lymph nodes compatible with metastatic disease.  She also hypermetabolic subcutaneous soft tissue nodule the right lower back concerning for soft tissue metastasis.  She is currently under pain management although continues to have left upper extremity pain and decreased strength in the left upper extremity.  I been asked to evaluate her for palliative treatment.  She is being scheduled with IR for possible left supraclavicular nodal biopsy.  PLANNED TREATMENT REGIMEN: Palliative radiation to left upper lobe primary lesion as well as spine  PAST MEDICAL HISTORY:  has a past medical history of  Actinic keratosis, Basal cell carcinoma (06/14/2020), Breast cancer ([place]) (2008), Cancer [facility]), Complication of anesthesia, GERD (gastroesophageal reflux disease), Hypertension, Melanoma ([place]) (02/01/2010), Personal history of radiation therapy, Personal history of tobacco use, presenting hazards to health (05/31/2015), Tobacco abuse (01/27/2015), and Tobacco abuse (01/27/2015).    PAST SURGICAL HISTORY:  Past Surgical History:  Procedure Laterality Date   BREAST EXCISIONAL BIOPSY Left 2008   + radation   BREAST EXCISIONAL BIOPSY Right 2001 2010   neg surgical bx's   BREAST LUMPECTOMY Left 2008   w/radiation   CATARACT EXTRACTION W/PHACO Right 04/12/2016   Procedure: Cataract extraction phaco and intraocular lens placement right eye;  Surgeon: Leandrew Koyanagi, MD;  Location: [place];  Service: Ophthalmology;  Laterality: Right;   CATARACT EXTRACTION W/PHACO Left 01/24/2017   Procedure: CATARACT EXTRACTION PHACO AND INTRAOCULAR LENS PLACEMENT ([place]) Complicated left;  Surgeon: Leandrew Koyanagi, MD;  Location: [place];  Service: Ophthalmology;  Laterality: Left;  Complicated [place]   CHOLECYSTECTOMY     D&C and hysteroscopy  1996   LEEP  1996   TUBAL LIGATION      FAMILY HISTORY: family history includes Pancreatic cancer in her father.  SOCIAL HISTORY:  reports that she has been smoking cigarettes. She has a 8.00 pack-year smoking history. She has never used smokeless tobacco. She reports that she does not drink alcohol and does not use drugs.  ALLERGIES: Acetaminophen-pamabrom, Codeine, and Midol pm [diphenhydramine-apap (sleep)]  MEDICATIONS:  Current Outpatient Medications  Medication Sig Dispense Refill   acetaminophen (TYLENOL) 650 MG CR tablet Take 650 mg by mouth every 8 (eight) hours as needed for  pain.     amLODipine (NORVASC) 2.5 MG tablet Take 2.5 mg by mouth daily.      calcium carbonate (OSCAL) 1500 (600 CA) MG TABS tablet Take 600 mg of  elemental calcium by mouth 2 (two) times daily with a meal.     citalopram (CELEXA) 10 MG tablet Take 10 mg by mouth daily.     dexamethasone (DECADRON) 4 MG tablet Take 1 pill 3 times a day for 1 week.  And then 1 pill twice a day for 1 week; and then 1 pill a day. 45 tablet 0   gabapentin (NEURONTIN) 300 MG capsule Take 300 mg by mouth in the morning, at noon, and at bedtime.     meloxicam (MOBIC) 7.5 MG tablet Take 1 tablet (7.5 mg total) by mouth daily. 30 tablet 1   ondansetron (ZOFRAN) 8 MG tablet One pill every 8 hours as needed for nausea/vomitting. 40 tablet 1   ranitidine (ZANTAC) 150 MG tablet Take 150 mg by mouth daily as needed for heartburn.     triamterene-hydrochlorothiazide (DYAZIDE) 37.5-25 MG capsule Take 1 capsule by mouth daily.   1   No current facility-administered medications for this encounter.    ECOG PERFORMANCE STATUS:  1 - Symptomatic but completely ambulatory  REVIEW OF SYSTEMS: Patient denies any weight loss, fatigue, weakness, fever, chills or night sweats. Patient denies any loss of vision, blurred vision. Patient denies any ringing  of the ears or hearing loss. No irregular heartbeat. Patient denies heart murmur or history of fainting. Patient denies any chest pain or pain radiating to her upper extremities. Patient denies any shortness of breath, difficulty breathing at night, cough or hemoptysis. Patient denies any swelling in the lower legs. Patient denies any nausea vomiting, vomiting of blood, or coffee ground material in the vomitus. Patient denies any stomach pain. Patient states has had normal bowel movements no significant constipation or diarrhea. Patient denies any dysuria, hematuria or significant nocturia. Patient denies any problems walking, swelling in the joints or loss of balance. Patient denies any skin changes, loss of hair or loss of weight. Patient denies any excessive worrying or anxiety or significant depression. Patient denies any problems with  insomnia. Patient denies excessive thirst, polyuria, polydipsia. Patient denies any swollen glands, patient denies easy bruising or easy bleeding. Patient denies any recent infections, allergies or URI. Patient "s visual fields have not changed significantly in recent time.   PHYSICAL EXAM: There were no vitals taken for this visit. Patient has decreased strength in her left upper extremity.  Range of motion left upper extremity does not elicit pain deep palpation of her spine does not elicit pain.  Well-developed well-nourished patient in NAD. HEENT reveals PERLA, EOMI, discs not visualized.  Oral cavity is clear. No oral mucosal lesions are identified. Neck is clear without evidence of cervical or supraclavicular adenopathy. Lungs are clear to A&P. Cardiac examination is essentially unremarkable with regular rate and rhythm without murmur rub or thrill. Abdomen is benign with no organomegaly or masses noted. Motor sensory and DTR levels are equal and symmetric in the upper and lower extremities. Cranial nerves II through XII are grossly intact. Proprioception is intact. No peripheral adenopathy or edema is identified. No motor or sensory levels are noted. Crude visual fields are within normal range.  LABORATORY DATA: Pathology pending left supraclavicular biopsy is to be arranged    RADIOLOGY RESULTS: CT scans PET scan and MRI scans all reviewed compatible with above-stated findings   IMPRESSION: Stage  IV lung cancer of the left upper lobe an 82 year old female  PLAN: At this time is to go ahead with a palliative course of radiation therapy to her left upper lobe including her cervical and thoracic spines that are involved with metastatic disease.  Would plan on delivering 40 Gray in 4 weeks.  Risks and benefits of treatment including skin reaction fatigue alteration of blood counts possible radiation esophagitis development of cough all were discussed in detail with the patient.  I personally set up  and ordered CT simulation for early next week.  In the meantime we will discuss her case at tumor board and discussed avenues of tissue diagnosis including left supraclavicular lymph node biopsy.  Patient comprehends my recommendations well.  I would like to take this opportunity to thank you for allowing me to participate in the care of your patient.Noreene Filbert, MD

## 2022-12-07 NOTE — Progress Notes (Signed)
[facility] NOTE  Patient Care Team: [place] Pink, MD as PCP - General (Family Medicine) Telford Nab, RN as Oncology Nurse Navigator  CHIEF COMPLAINTS/PURPOSE OF CONSULTATION: Metastasis to bone.  Oncology History Overview Note  MARCH 2024- MRi cervical spine- IMPRESSION: 1. Findings compatible with multifocal osseous metastatic disease, as described above. 2. Abnormal pleuroparenchymal thickening/mass at the left lung apex, concerning for Pancoast tumor or less likely metastasis. This malignancy probably involves the left C7-T1 and T1-T2 foramina and encroaches on more inferior left thoracic foramina, incompletely imaged. Recommend dedicated CT of the chest (preferably with contrast) to further evaluate the chest. Also, dedicated MRI of the thoracic spine with contrast could further characterize thoracic extent if clinically warranted.     Malignant neoplasm of breast (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 Accompanied by family, cousin.    Crystal Mcmahon 83 y.o.  female with longstanding history of smoking; DCIS; history of superficial early stage melanoma; and stage 1 thyroid cancer-diagnosed left apical lung mass/multiple bone lesions on imaging is here to review results of her blood work/PET scan  Patient continues to have has constant pain in left arm since end of January. Pain is rated 6/10. Allergic to codeine, pain is not any better. Can't sleep due to pain.  Patient currently not taking any pain medication except for gabapentin.  Appetite is fine. Denies any dyspnea or cough. Denies weight loss.  Review of Systems  Constitutional:  Negative for chills, diaphoresis, fever,  malaise/fatigue and weight loss.  HENT:  Negative for nosebleeds and sore throat.   Eyes:  Negative for double vision.  Respiratory:  Negative for cough, hemoptysis, sputum production, shortness of breath and wheezing.   Cardiovascular:  Negative for chest pain, palpitations, orthopnea and leg swelling.  Gastrointestinal:  Negative for abdominal pain, blood in stool, constipation, diarrhea, heartburn, melena, nausea and vomiting.  Genitourinary:  Negative for dysuria, frequency and urgency.  Musculoskeletal:  Positive for neck pain. Negative for back pain and joint pain.  Skin: Negative.  Negative for itching and rash.  Neurological:  Positive for tingling, sensory change and focal weakness. Negative for dizziness, weakness and headaches.  Endo/Heme/Allergies:  Does not bruise/bleed easily.  Psychiatric/Behavioral:  Negative for depression. The patient is not nervous/anxious and does not have insomnia.     MEDICAL HISTORY:  Past Medical History:  Diagnosis Date   Actinic keratosis    Basal cell carcinoma 06/14/2020   posterior neck, [facility]   Breast cancer ([place]) 2008   left   Cancer ([place])    melanoma   Complication of anesthesia    slow to wake up after surgery   GERD (gastroesophageal reflux disease)    Hypertension    Melanoma ([place]) 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 Malignant melanoma, lentigo maligna type. Clark's level II, Breslow's 0.24mm   Personal history of radiation therapy    Left breast   Personal history of tobacco use, presenting hazards to health 05/31/2015   Tobacco abuse 01/27/2015   Tobacco abuse 01/27/2015    SURGICAL HISTORY: Past Surgical History:  Procedure Laterality Date   BREAST EXCISIONAL BIOPSY Left 2008   + radation   BREAST EXCISIONAL BIOPSY Right 2001 2010  neg surgical bx's   BREAST LUMPECTOMY Left 2008   w/radiation   CATARACT EXTRACTION W/PHACO Right 04/12/2016   Procedure: Cataract extraction phaco and intraocular lens placement right eye;   Surgeon: Leandrew Koyanagi, MD;  Location: [facility];  Service: Ophthalmology;  Laterality: Right;   CATARACT EXTRACTION W/PHACO Left 01/24/2017   Procedure: CATARACT EXTRACTION PHACO AND INTRAOCULAR LENS PLACEMENT ([place]) Complicated left;  Surgeon: Leandrew Koyanagi, MD;  Location: [facility];  Service: Ophthalmology;  Laterality: Left;  Complicated [place]   CHOLECYSTECTOMY     D&C and hysteroscopy  1996   LEEP  1996   TUBAL LIGATION      SOCIAL HISTORY: Social History   Socioeconomic History   Marital status: Married    Spouse name: Not on file   Number of children: Not on file   Years of education: Not on file   Highest education level: Not on file  Occupational History   Not on file  Tobacco Use   Smoking status: Every Day    Packs/day: 0.25    Years: 32.00    Additional pack years: 0.00    Total pack years: 8.00    Types: Cigarettes   Smokeless tobacco: Never  Substance and Sexual Activity   Alcohol use: No    Alcohol/week: 0.0 standard drinks of alcohol    Comment: occassional   Drug use: No   Sexual activity: Not Currently  Other Topics Concern   Not on file  Social History Narrative   Lives by self; husband in assisted living; Smoker since 79s [college]; rare alcohol. 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 2 children- [place]/ and [place].    Social Determinants of Health   Financial Resource Strain: Not on file  Food Insecurity: No Food Insecurity (11/29/2022)   Hunger Vital Sign    Worried About Running Out of Food in the Last Year: Never true    Ran Out of Food in the Last Year: Never true  Transportation Needs: No Transportation Needs (11/29/2022)   PRAPARE - Hydrologist (Medical): No    Lack of Transportation (Non-Medical): No  Physical Activity: Not on file  Stress: Not on file  Social Connections: Not on file  Intimate Partner Violence: Not At Risk (11/29/2022)   Humiliation, Afraid, Rape, and Kick  questionnaire    Fear of Current or Ex-Partner: No    Emotionally Abused: No    Physically Abused: No    Sexually Abused: No    FAMILY HISTORY: Family History  Problem Relation Age of Onset   Pancreatic cancer Father    Breast cancer Neg Hx     ALLERGIES:  is allergic to acetaminophen-pamabrom, codeine, and midol pm [diphenhydramine-apap (sleep)].  MEDICATIONS:  Current Outpatient Medications  Medication Sig Dispense Refill   acetaminophen (TYLENOL) 650 MG CR tablet Take 650 mg by mouth every 8 (eight) hours as needed for pain.     amLODipine (NORVASC) 2.5 MG tablet Take 2.5 mg by mouth daily.      calcium carbonate (OSCAL) 1500 (600 CA) MG TABS tablet Take 600 mg of elemental calcium by mouth 2 (two) times daily with a meal.     citalopram (CELEXA) 10 MG tablet Take 10 mg by mouth daily.     dexamethasone (DECADRON) 4 MG tablet Take 1 pill 3 times a day for 1 week.  And then 1 pill twice a day for 1 week; and then 1 pill a day. 45 tablet 0   gabapentin (  NEURONTIN) 300 MG capsule Take 300 mg by mouth in the morning, at noon, and at bedtime.     ondansetron (ZOFRAN) 8 MG tablet One pill every 8 hours as needed for nausea/vomitting. 40 tablet 1   ranitidine (ZANTAC) 150 MG tablet Take 150 mg by mouth daily as needed for heartburn.     triamterene-hydrochlorothiazide (DYAZIDE) 37.5-25 MG capsule Take 1 capsule by mouth daily.   1   meloxicam (MOBIC) 7.5 MG tablet Take 1 tablet (7.5 mg total) by mouth daily. 30 tablet 1   No current facility-administered medications for this visit.    PHYSICAL EXAMINATION:   Vitals:   12/07/22 0856  BP: (!) 118/58  Pulse: 95  Resp: 16  Temp: (!) 97.2 F (36.2 C)    Filed Weights   12/07/22 0856  Weight: 123 lb (55.8 kg)    Patient has weakness of the left upper extremity especially hands.  Physical Exam Vitals and nursing note reviewed.  HENT:     Head: Normocephalic and atraumatic.     Mouth/Throat:     Pharynx: Oropharynx is  clear.  Eyes:     Extraocular Movements: Extraocular movements intact.     Pupils: Pupils are equal, round, and reactive to light.  Cardiovascular:     Rate and Rhythm: Normal rate and regular rhythm.  Pulmonary:     Comments: Decreased breath sounds bilaterally.  Abdominal:     Palpations: Abdomen is soft.  Musculoskeletal:        General: Normal range of motion.     Cervical back: Normal range of motion.  Skin:    General: Skin is warm.  Neurological:     Mental Status: She is alert and oriented to person, place, and time.  Psychiatric:        Behavior: Behavior normal.        Judgment: Judgment normal.     LABORATORY DATA:  I have reviewed the data as listed Lab Results  Component Value Date   WBC 11.8 (H) 11/29/2022   HGB 14.0 11/29/2022   HCT 42.9 11/29/2022   MCV 94.7 11/29/2022   PLT 319 11/29/2022   Recent Labs    11/29/22 1231  NA 137  K 3.2*  CL 100  CO2 28  GLUCOSE 125*  BUN 21  CREATININE 0.98  CALCIUM 9.0  GFRNONAA 57*  PROT 7.1  ALBUMIN 3.6  AST 24  ALT 21  ALKPHOS 68  BILITOT 0.2*    RADIOGRAPHIC STUDIES: I have personally reviewed the radiological images as listed and agreed with the findings in the report. NM PET Image Initial (PI) Skull Base To Thigh  Result Date: 12/05/2022 CLINICAL DATA:  Initial treatment strategy for lung mass. EXAM: NUCLEAR MEDICINE PET SKULL BASE TO THIGH TECHNIQUE: 6.8 mCi F-18 FDG was injected intravenously. Full-ring PET imaging was performed from the skull base to thigh after the radiotracer. CT data was obtained and used for attenuation correction and anatomic localization. Fasting blood glucose: 126 mg/dl COMPARISON:  None Available. FINDINGS: Mediastinal blood pool activity: SUV max 2.6 Liver activity: SUV max NA NECK: No hypermetabolic lymph nodes in the neck. Incidental CT findings: None. CHEST: Hypermetabolic soft tissue mass of the left lung apex with associated hypermetabolic left apical pleural thickening  measuring 4.6 x 2.9 cm on series 4, image 29 with SUV max of 14. Enlarged and hypermetabolic mediastinal lymph nodes. Reference pre-vascular lymph node measuring 1.4 cm in short axis on series 4, image 37 with SUV max of  18.1. Reference AP window lymph node measuring 1.5 cm in short axis on image 43 with SUV max of 13.8. Hypermetabolic left supraclavicular lymph node measuring 0.6 cm in short axis on image 31 with SUV max of 7.3. Incidental CT findings: Calcification of the left breast, likely sequela of fat necrosis. Normal caliber thoracic aorta with moderate atherosclerotic disease. Small to moderate hiatal hernia. Trace left pleural effusion bibasilar atelectasis. Small right Bochdalek hernia. Numerous ground-glass nodular opacities are seen in the bilateral upper lobes, largest nodules demonstrate minimal FDG uptake. Reference ground-glass nodule of the right upper lobe measuring 4.0 x 2.4 cm on series 4, image 52, previously measured 2.2 x 1.9 cm, SUV max of 1.5. ABDOMEN/PELVIS: Hypermetabolic subcutaneous soft tissue nodule of the right lower back measuring 0.7 cm on series 4, image 94 with SUV max of 9.3. No abnormal hypermetabolic activity within the liver, pancreas, adrenal glands, or spleen. No hypermetabolic lymph nodes in the abdomen or pelvis. Incidental CT findings: Cholecystectomy clips. Simple appearing right renal cysts, no specific follow-up imaging is recommended for this finding. Atherosclerotic disease of the abdominal aorta. Diverticulosis. SKELETON: Hypermetabolic osseous lesions of the of the C7-T3 vertebral bodies with hypermetabolic soft tissue involving the left C7-T1 and left T1-T2 foramina, SUV max of the T3 vertebral body is 13.8. More mild focal areas of seen involving the posterior elements of T4 and posterior T5 vertebral body. Multiple hypermetabolic anterolateral rib lesions with associated rib fractures involving the fifth through eighth ribs, likely posttraumatic. Incidental CT  findings: None. IMPRESSION: 1. Hypermetabolic soft tissue mass of the left lung apex with associated hypermetabolic left apical pleural thickening, compatible with primary lung malignancy. 2. Enlarged and hypermetabolic mediastinal and left supraclavicular lymph nodes, compatible with metastatic disease. 3. Hypermetabolic osseous metastatic disease of the lower cervical and upper thoracic spine, see separately dictated MRI of the cervical spine dated November 21, 2022 for more complete discussion of foramina involvement. 4. Hypermetabolic subcutaneous soft tissue nodule of the right lower back, concerning for soft tissue metastatic disease. 5. Numerous ground-glass nodular opacities are seen in the bilateral upper lobes, largest nodules demonstrate minimal FDG uptake and are increased in size when compared with November 17, 2015 prior, likely due to multifocal indolent primary lung adenocarcinoma. Recommend dedicated chest CT for further evaluation. 6. Multiple hypermetabolic anterolateral right rib lesions which correlate with rib fractures, likely posttraumatic. Electronically Signed   By: Yetta Glassman M.D.   On: 12/05/2022 16:29   MR CERVICAL SPINE W WO CONTRAST  Result Date: 11/21/2022 EXAM: MRI CERVICAL SPINE WITHOUT AND WITH CONTRAST TECHNIQUE: Multiplanar and multiecho pulse sequences of the cervical spine, to include the craniocervical junction and cervicothoracic junction, were obtained without and with intravenous contrast. CONTRAST:  67mL GADAVIST GADOBUTROL 1 MMOL/ML IV SOLN COMPARISON:  None Available. FINDINGS: Alignment: Mild anterolisthesis of C4 on C5 and C5 on C6. Vertebrae: Multifocal abnormal marrow signal which is T1 hypointense, stir hyperintense and enhancing. This includes near complete marrow replacement of the T2 vertebral body as well as smaller lesions involving the T3, C7 and potentially T1 vertebral bodies. Cord: Normal cord signal. Posterior Fossa, vertebral arteries, paraspinal tissues:  The visualized vertebral artery flow voids are maintained. No evidence of acute abnormality in the visualized posterior fossa. Abnormal pleuroparenchymal thickening/mass at the left lung apex, incompletely imaged. Disc levels: C2-C3: No significant disc protrusion, foraminal stenosis, or canal stenosis. C3-C4: Facet arthropathy.  No significant stenosis. C4-C5: Facet arthropathy.  No significant stenosis. C5-C6: Posterior disc osteophyte complex, centric  to the left. Left greater than right facet and uncovertebral hypertrophy. Resulting mild left foraminal stenosis. No significant canal or right foraminal stenosis. C6-C7: Left eccentric posterior disc osteophyte complex and left greater than right facet and uncovertebral hypertrophy. Mild left foraminal stenosis. No significant canal or right foraminal stenosis. C7-T1: Abnormal enhancing soft tissue in the left paraspinal soft tissues which is contiguous with the abnormality at the left lung apex described above. This likely involves the left at C7-T1 foramen (for example see series 16, image 23). Otherwise, canal and right foramen are patent. T1-T2: Abnormal enhancing soft tissue in the left paraspinal soft tissues which is contiguous with the abnormality at the left lung apex described above. This likely involves the left at T1-T2 foramen (for example see series 16, images 27-29). Otherwise, canal and right foramen are patent. IMPRESSION: 1. Findings compatible with multifocal osseous metastatic disease, as described above. 2. Abnormal pleuroparenchymal thickening/mass at the left lung apex, concerning for Pancoast tumor or less likely metastasis. This malignancy probably involves the left C7-T1 and T1-T2 foramina and encroaches on more inferior left thoracic foramina, incompletely imaged. Recommend dedicated CT of the chest (preferably with contrast) to further evaluate the chest. Also, dedicated MRI of the thoracic spine with contrast could further characterize  thoracic extent if clinically warranted. These results will be called to the ordering clinician or representative by the Radiologist Assistant, and communication documented in the PACS or [employer]. Electronically Signed   By: Margaretha Sheffield M.D.   On: 11/21/2022 16:32     Cancer, metastatic to bone [facility]) # MARCH 5th 2024- MRI cervical spine- concerning for metastatic disease to the multiple vertebral bodies/bone; also involvement of the neural foramina; with left apical lung mass. PET March, 19th, 123456-  Hypermetabolic soft tissue mass of the left lung apex with associated hypermetabolic left apical pleural thickening, compatible with primary lung malignancy. Enlarged and hypermetabolic mediastinal and left supraclavicular lymph nodes, compatible with metastatic disease. Hypermetabolic osseous metastatic disease of the lower cervical and upper thoracic spine. Hypermetabolic subcutaneous soft tissue nodule of the right lower back, concerning for soft tissue metastatic disease. Numerous ground-glass nodular opacities are seen in the bilateral upper lobes, largest nodules demonstrate minimal FDG uptake and are increased in size when compared with November 17, 2015 prior, likely due to multifocal indolent primary lung adenocarcinoma. Multiple hypermetabolic anterolateral right rib lesions which correlate with rib fractures, likely posttraumatic.   #I had long discussion that based on imaging findings highly concerning for malignancy-highly concerning for lung cancer in the context of her smoking. Less likely melanoma/thyroid cancer; less likely breast [patient history of DCIS].  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  Discussed that unfortunately her malignancy is incurable.  Based on biopsy patient will need systemic therapy/radiation.    # Patient need biopsy. The options for biopsy include-interventional radiology guided or bronchoscopy with pulmonary.   Will discuss with IR regarding left supraclavicular lymph node.  Will also discuss at tumor conference.    # History of THyroid cancer- partial thyroidectomy [s/p endo- Feb 2024]-low clinical concern for recurrence.  # History of Melanoma R lateral cheek, 0.86mm, 2011 [Dr.Stewart] -no obvious clinical recurrence.  Again await above workup.  # Active smoker-5 cigarettes a day.  Will discuss smoking counseling further at next visit.  # Neck/left arm pain-secondary malignancy.  Cervical spine MRI-March 2024 no evidence of any cord compression.  Currently on gabapentin'/tylenol prn.  Patient did not start tramadol [hx:  Allergy nausea vomiting].  Recommend Mobic 7.5 mg.  Also recommend dexamethasone 4 mg- take 1 pill 3 times a day for 1 week.  And then 1 pill twice a day for 1 week; and then 1 pill a day.  I also discussed the role of Zometa to decrease skeletal related events [pain; fractures; need for radiation; hypercalcemia].  Discussed the potential side effects including but not limited to-infusion reaction; hypocalcemia; and Osteo-necrosis of jaw. Recommend ca+Vit D BID.  Zometa ordered for next visit.  Discussed with nurse navigator [facility].  Recommend canceling the appointment for CT scan ordered by orthopedics for tomorrow.  Okay to keep the MRI of the thoracic spine.  Patient will need MRI brain down the line.  Patient does not have any CNS symptoms at this time.  # DISPOSITION: # Refer to Dr. Donella Stade regarding-bone metastases- ASAP # follow up on April 5th-   with MD- labs- cbc/cmp; Vit D 25-OH levels; possible zometa -  dr.B  # I reviewed the blood work- with the patient in detail; also reviewed the imaging independently [as summarized above]; and with the patient in detail.      Cammie Sickle, MD 12/07/2022 10:12 AM

## 2022-12-07 NOTE — Progress Notes (Signed)
Has constant pain in left arm since end of January. Pain is rated 6/10. Allergic to codeine, pain is not any better. Appetite is fine. Can't sleep due to pain. Denies any dyspnea or cough. Denies weight loss.

## 2022-12-08 ENCOUNTER — Ambulatory Visit
Admission: RE | Admit: 2022-12-08 | Discharge: 2022-12-08 | Disposition: A | Discharge status: Home / Self Care | Payer: Medicare Other | Source: Ambulatory Visit | Attending: Sports Medicine | Admitting: Sports Medicine

## 2022-12-08 ENCOUNTER — Ambulatory Visit: Payer: Medicare Other

## 2022-12-08 DIAGNOSIS — C7951 Secondary malignant neoplasm of bone: Secondary | ICD-10-CM | POA: Diagnosis present

## 2022-12-08 MED ORDER — GADOBUTROL 1 MMOL/ML IV SOLN
5.0000 mL | Freq: Once | INTRAVENOUS | Status: AC | PRN
  Administered 2022-12-08: 5 mL via INTRAVENOUS

## 2022-12-11 ENCOUNTER — Ambulatory Visit: Payer: Medicare Other

## 2022-12-12 ENCOUNTER — Ambulatory Visit
Admission: RE | Admit: 2022-12-12 | Discharge: 2022-12-12 | Disposition: A | Discharge status: Home / Self Care | Payer: Medicare Other | Source: Ambulatory Visit | Attending: Radiation Oncology | Admitting: Radiation Oncology

## 2022-12-12 DIAGNOSIS — C7951 Secondary malignant neoplasm of bone: Secondary | ICD-10-CM | POA: Diagnosis not present

## 2022-12-12 DIAGNOSIS — Z510 Encounter for antineoplastic radiation therapy: Secondary | ICD-10-CM | POA: Diagnosis not present

## 2022-12-12 DIAGNOSIS — F17210 Nicotine dependence, cigarettes, uncomplicated: Secondary | ICD-10-CM | POA: Diagnosis not present

## 2022-12-12 DIAGNOSIS — C3412 Malignant neoplasm of upper lobe, left bronchus or lung: Secondary | ICD-10-CM | POA: Diagnosis present

## 2022-12-14 DIAGNOSIS — Z510 Encounter for antineoplastic radiation therapy: Secondary | ICD-10-CM | POA: Diagnosis not present

## 2022-12-14 NOTE — Progress Notes (Signed)
Patient for US guided Core LT Supraclavicular LN Bx on Fri 12/15/2022, I called and spoke with the patient on the phone and gave pre-procedure instructions. Pt was made aware to be here at 12:30p and check in at the [facility] registration desk. Pt stated understanding.  Called 12/14/2022

## 2022-12-15 ENCOUNTER — Other Ambulatory Visit: Payer: Self-pay | Admitting: *Deleted

## 2022-12-15 ENCOUNTER — Ambulatory Visit
Admission: RE | Admit: 2022-12-15 | Discharge: 2022-12-15 | Disposition: A | Discharge status: Home / Self Care | Payer: Medicare Other | Source: Ambulatory Visit | Attending: Internal Medicine | Admitting: Internal Medicine

## 2022-12-15 DIAGNOSIS — C419 Malignant neoplasm of bone and articular cartilage, unspecified: Secondary | ICD-10-CM

## 2022-12-15 DIAGNOSIS — R918 Other nonspecific abnormal finding of lung field: Secondary | ICD-10-CM

## 2022-12-15 DIAGNOSIS — R590 Localized enlarged lymph nodes: Secondary | ICD-10-CM | POA: Diagnosis not present

## 2022-12-15 DIAGNOSIS — C772 Secondary and unspecified malignant neoplasm of intra-abdominal lymph nodes: Secondary | ICD-10-CM | POA: Insufficient documentation

## 2022-12-15 DIAGNOSIS — C3490 Malignant neoplasm of unspecified part of unspecified bronchus or lung: Secondary | ICD-10-CM | POA: Diagnosis not present

## 2022-12-15 MED ORDER — LIDOCAINE HCL (PF) 1 % IJ SOLN
10.0000 mL | Freq: Once | INTRAMUSCULAR | Status: AC
  Administered 2022-12-15: 10 mL via INTRADERMAL

## 2022-12-18 ENCOUNTER — Ambulatory Visit: Admission: RE | Admit: 2022-12-18 | Payer: Medicare Other | Source: Ambulatory Visit

## 2022-12-18 DIAGNOSIS — F17210 Nicotine dependence, cigarettes, uncomplicated: Secondary | ICD-10-CM | POA: Diagnosis not present

## 2022-12-18 DIAGNOSIS — D72828 Other elevated white blood cell count: Secondary | ICD-10-CM | POA: Insufficient documentation

## 2022-12-18 DIAGNOSIS — C7951 Secondary malignant neoplasm of bone: Secondary | ICD-10-CM | POA: Diagnosis not present

## 2022-12-18 DIAGNOSIS — Z79899 Other long term (current) drug therapy: Secondary | ICD-10-CM | POA: Insufficient documentation

## 2022-12-18 DIAGNOSIS — Z510 Encounter for antineoplastic radiation therapy: Secondary | ICD-10-CM | POA: Diagnosis not present

## 2022-12-18 DIAGNOSIS — G893 Neoplasm related pain (acute) (chronic): Secondary | ICD-10-CM | POA: Insufficient documentation

## 2022-12-18 DIAGNOSIS — C3412 Malignant neoplasm of upper lobe, left bronchus or lung: Secondary | ICD-10-CM | POA: Diagnosis present

## 2022-12-19 ENCOUNTER — Ambulatory Visit
Admission: RE | Admit: 2022-12-19 | Discharge: 2022-12-19 | Disposition: A | Discharge status: Home / Self Care | Payer: Medicare Other | Source: Ambulatory Visit | Attending: Radiation Oncology | Admitting: Radiation Oncology

## 2022-12-19 ENCOUNTER — Other Ambulatory Visit: Payer: Self-pay

## 2022-12-19 DIAGNOSIS — Z510 Encounter for antineoplastic radiation therapy: Secondary | ICD-10-CM | POA: Diagnosis not present

## 2022-12-19 LAB — RAD ONC ARIA SESSION SUMMARY
Course Elapsed Days: 0
Plan Fractions Treated to Date: 1
Plan Prescribed Dose Per Fraction: 2 Gy
Plan Total Fractions Prescribed: 20
Plan Total Prescribed Dose: 40 Gy
Reference Point Dosage Given to Date: 2 Gy
Reference Point Session Dosage Given: 2 Gy
Session Number: 1

## 2022-12-20 ENCOUNTER — Other Ambulatory Visit: Payer: Self-pay

## 2022-12-20 ENCOUNTER — Encounter: Payer: Self-pay | Admitting: *Deleted

## 2022-12-20 ENCOUNTER — Ambulatory Visit
Admission: RE | Admit: 2022-12-20 | Discharge: 2022-12-20 | Disposition: A | Discharge status: Home / Self Care | Payer: Medicare Other | Source: Ambulatory Visit | Attending: Radiation Oncology | Admitting: Radiation Oncology

## 2022-12-20 ENCOUNTER — Other Ambulatory Visit: Payer: Self-pay | Admitting: Pathology

## 2022-12-20 DIAGNOSIS — Z510 Encounter for antineoplastic radiation therapy: Secondary | ICD-10-CM | POA: Diagnosis not present

## 2022-12-20 LAB — RAD ONC ARIA SESSION SUMMARY
Course Elapsed Days: 1
Plan Fractions Treated to Date: 2
Plan Prescribed Dose Per Fraction: 2 Gy
Plan Total Fractions Prescribed: 20
Plan Total Prescribed Dose: 40 Gy
Reference Point Dosage Given to Date: 4 Gy
Reference Point Session Dosage Given: 2 Gy
Session Number: 2

## 2022-12-20 LAB — SURGICAL PATHOLOGY

## 2022-12-20 NOTE — Progress Notes (Signed)
Per Dr. Jacinto Reap, [facility] with PDL1 for Beryle Flock needs to be ordered on tissue collected from soft tissue biopsy on 12/15/22. Order placed and request faxed to pathology to send out sample.

## 2022-12-21 ENCOUNTER — Ambulatory Visit
Admission: RE | Admit: 2022-12-21 | Discharge: 2022-12-21 | Disposition: A | Discharge status: Home / Self Care | Payer: Medicare Other | Source: Ambulatory Visit | Attending: Radiation Oncology | Admitting: Radiation Oncology

## 2022-12-21 ENCOUNTER — Other Ambulatory Visit: Payer: Self-pay

## 2022-12-21 DIAGNOSIS — Z510 Encounter for antineoplastic radiation therapy: Secondary | ICD-10-CM | POA: Diagnosis not present

## 2022-12-21 LAB — RAD ONC ARIA SESSION SUMMARY
Course Elapsed Days: 2
Plan Fractions Treated to Date: 3
Plan Prescribed Dose Per Fraction: 2 Gy
Plan Total Fractions Prescribed: 20
Plan Total Prescribed Dose: 40 Gy
Reference Point Dosage Given to Date: 6 Gy
Reference Point Session Dosage Given: 2 Gy
Session Number: 3

## 2022-12-22 ENCOUNTER — Other Ambulatory Visit: Payer: Self-pay | Admitting: *Deleted

## 2022-12-22 ENCOUNTER — Other Ambulatory Visit: Payer: Self-pay

## 2022-12-22 ENCOUNTER — Encounter: Payer: Self-pay | Admitting: *Deleted

## 2022-12-22 ENCOUNTER — Inpatient Hospital Stay (HOSPITAL_BASED_OUTPATIENT_CLINIC_OR_DEPARTMENT_OTHER): Payer: Medicare Other | Admitting: Internal Medicine

## 2022-12-22 ENCOUNTER — Ambulatory Visit
Admission: RE | Admit: 2022-12-22 | Discharge: 2022-12-22 | Disposition: A | Discharge status: Home / Self Care | Payer: Medicare Other | Source: Ambulatory Visit | Attending: Radiation Oncology | Admitting: Radiation Oncology

## 2022-12-22 ENCOUNTER — Encounter: Payer: Self-pay | Admitting: Internal Medicine

## 2022-12-22 ENCOUNTER — Inpatient Hospital Stay: Payer: Medicare Other

## 2022-12-22 VITALS — BP 113/59 | HR 79 | Temp 96.5°F | Resp 16 | Wt 124.0 lb

## 2022-12-22 DIAGNOSIS — Z85820 Personal history of malignant melanoma of skin: Secondary | ICD-10-CM | POA: Insufficient documentation

## 2022-12-22 DIAGNOSIS — C3412 Malignant neoplasm of upper lobe, left bronchus or lung: Secondary | ICD-10-CM

## 2022-12-22 DIAGNOSIS — F17210 Nicotine dependence, cigarettes, uncomplicated: Secondary | ICD-10-CM | POA: Insufficient documentation

## 2022-12-22 DIAGNOSIS — M899 Disorder of bone, unspecified: Secondary | ICD-10-CM

## 2022-12-22 DIAGNOSIS — Z85850 Personal history of malignant neoplasm of thyroid: Secondary | ICD-10-CM | POA: Insufficient documentation

## 2022-12-22 DIAGNOSIS — G893 Neoplasm related pain (acute) (chronic): Secondary | ICD-10-CM | POA: Insufficient documentation

## 2022-12-22 DIAGNOSIS — Z9049 Acquired absence of other specified parts of digestive tract: Secondary | ICD-10-CM | POA: Insufficient documentation

## 2022-12-22 DIAGNOSIS — C7951 Secondary malignant neoplasm of bone: Secondary | ICD-10-CM | POA: Insufficient documentation

## 2022-12-22 DIAGNOSIS — M810 Age-related osteoporosis without current pathological fracture: Secondary | ICD-10-CM

## 2022-12-22 DIAGNOSIS — Z853 Personal history of malignant neoplasm of breast: Secondary | ICD-10-CM | POA: Insufficient documentation

## 2022-12-22 DIAGNOSIS — C3490 Malignant neoplasm of unspecified part of unspecified bronchus or lung: Secondary | ICD-10-CM | POA: Insufficient documentation

## 2022-12-22 DIAGNOSIS — Z510 Encounter for antineoplastic radiation therapy: Secondary | ICD-10-CM | POA: Diagnosis not present

## 2022-12-22 LAB — RAD ONC ARIA SESSION SUMMARY
Course Elapsed Days: 3
Plan Fractions Treated to Date: 4
Plan Prescribed Dose Per Fraction: 2 Gy
Plan Total Fractions Prescribed: 20
Plan Total Prescribed Dose: 40 Gy
Reference Point Dosage Given to Date: 8 Gy
Reference Point Session Dosage Given: 2 Gy
Session Number: 4

## 2022-12-22 LAB — CBC WITH DIFFERENTIAL (CANCER CENTER ONLY)
Abs Immature Granulocytes: 0.26 10*3/uL — ABNORMAL HIGH (ref 0.00–0.07)
Basophils Absolute: 0.1 10*3/uL (ref 0.0–0.1)
Basophils Relative: 0 %
Eosinophils Absolute: 0.1 10*3/uL (ref 0.0–0.5)
Eosinophils Relative: 0 %
HCT: 42.1 % (ref 36.0–46.0)
Hemoglobin: 13.9 g/dL (ref 12.0–15.0)
Immature Granulocytes: 1 %
Lymphocytes Relative: 12 %
Lymphs Abs: 2.5 10*3/uL (ref 0.7–4.0)
MCH: 31.1 pg (ref 26.0–34.0)
MCHC: 33 g/dL (ref 30.0–36.0)
MCV: 94.2 fL (ref 80.0–100.0)
Monocytes Absolute: 2.1 10*3/uL — ABNORMAL HIGH (ref 0.1–1.0)
Monocytes Relative: 10 %
Neutro Abs: 16.4 10*3/uL — ABNORMAL HIGH (ref 1.7–7.7)
Neutrophils Relative %: 77 %
Platelet Count: 309 10*3/uL (ref 150–400)
RBC: 4.47 MIL/uL (ref 3.87–5.11)
RDW: 13.9 % (ref 11.5–15.5)
WBC Count: 21.4 10*3/uL — ABNORMAL HIGH (ref 4.0–10.5)
nRBC: 0 % (ref 0.0–0.2)

## 2022-12-22 LAB — CMP (CANCER CENTER ONLY)
ALT: 34 U/L (ref 0–44)
AST: 27 U/L (ref 15–41)
Albumin: 3.5 g/dL (ref 3.5–5.0)
Alkaline Phosphatase: 74 U/L (ref 38–126)
Anion gap: 7 (ref 5–15)
BUN: 26 mg/dL — ABNORMAL HIGH (ref 8–23)
CO2: 27 mmol/L (ref 22–32)
Calcium: 8.6 mg/dL — ABNORMAL LOW (ref 8.9–10.3)
Chloride: 99 mmol/L (ref 98–111)
Creatinine: 0.83 mg/dL (ref 0.44–1.00)
GFR, Estimated: >60 mL/min (ref >60)
Glucose, Bld: 80 mg/dL (ref 70–99)
Potassium: 3.7 mmol/L (ref 3.5–5.1)
Sodium: 133 mmol/L — ABNORMAL LOW (ref 135–145)
Total Bilirubin: 0.4 mg/dL (ref 0.3–1.2)
Total Protein: 6.6 g/dL (ref 6.5–8.1)

## 2022-12-22 LAB — VITAMIN D 25 HYDROXY (VIT D DEFICIENCY, FRACTURES): Vit D, 25-Hydroxy: 24.78 ng/mL — ABNORMAL LOW (ref 30–100)

## 2022-12-22 MED ORDER — MELOXICAM 15 MG PO TABS: 15.0000 mg | ORAL_TABLET | Freq: Every day | ORAL | 3 refills | Status: DC

## 2022-12-22 MED ORDER — SUCRALFATE 1 G PO TABS: 1.0000 g | ORAL_TABLET | Freq: Three times a day (TID) | ORAL | 1 refills | Status: DC

## 2022-12-22 MED ORDER — MORPHINE SULFATE 15 MG PO TABS: ORAL_TABLET | ORAL | 0 refills | Status: DC

## 2022-12-22 MED ORDER — LANSOPRAZOLE 30 MG PO CPDR: 30.0000 mg | DELAYED_RELEASE_CAPSULE | Freq: Every day | ORAL | 1 refills | Status: DC

## 2022-12-22 NOTE — Progress Notes (Signed)
Met with patient and her son during follow up visit with Dr. Donneta Romberg to review biopsy results and treatment options. All questions answered during visit. Reviewed upcoming appts. Pt did not have any further questions or needs at this time. Instructed to call back with any questions or needs. Pt verbalized understanding.

## 2022-12-22 NOTE — Progress Notes (Signed)
Pt in for follow up and zometa today.  Reports not feeling the best. States she continues to not hand use of her left hand and experiences numbness and tingling "shoots" from her finger tips up her arm.

## 2022-12-22 NOTE — Assessment & Plan Note (Addendum)
#   Left upper lobe lung cancer-# MARCH 5th 2024- MRI cervical spine- concerning for metastatic disease to the multiple vertebral bodies/bone; also involvement of the neural foramina; with left apical lung mass. PET March, 19th, 2024-  Hypermetabolic soft tissue mass of the left lung apex with associated hypermetabolic left apical pleural thickening, compatible with primary lung malignancy. Enlarged and hypermetabolic mediastinal and left supraclavicular lymph nodes, compatible with metastatic disease. Hypermetabolic osseous metastatic disease of the lower cervical and upper thoracic spine. Hypermetabolic subcutaneous soft tissue nodule of the right lower back, concerning for soft tissue metastatic disease. Numerous ground-glass nodular opacities are seen in the bilateral upper lobes, largest nodules demonstrate minimal FDG uptake and are increased in size  SOFT TISSUE NODULE, RIGHT FLANK; ULTRASOUND-GUIDED BIOPSY: - POORLY DIFFERENTIATED CARCINOMA COMPATIBLE WITH METASTATIC  ADENOCARCINOMA OF LUNG ORIGIN.   # I reviewed with the pathology and stage with the patient and family.  Unfortunately given advanced malignancy-the goal of treatment would be palliative/not curative.  Discussed the options include chemoimmunotherapy versus chemotherapy versus novel targeted therapy [unlikely given her smoking history; however NGS pending].  NGS pending at this time. Also discussed at tumor conference.    # I also discussed the potential side effects including but not limited to-increasing fatigue, nausea vomiting, diarrhea, hair loss, sores in the mouth, increase risk of infection and also neuropathy.   # Active smoker-5 cigarettes a day.  Will discuss smoking counseling further at next visit.   # Neck/left arm pain-secondary malignancy.  Cervical spine MRI-March 2024 no evidence of any cord compression.  Currently on gabapentin'/tylenol prn.  Poor tolerance according/tramadol.  Hold Zometa given calcium 8.6.Recommend  ca+Vit D BID. # pain control: meloxicam 30 mg/day; continue dex 4 mg day.  After lengthy discussion patient finally agrees to go up on the dose of meloxicam.  And also consider narcotic.  Recommend morphine low-dose.  Continue radiation [started on 12/19/2022].  Recommend evaluation with Josh.  They understand that radiation might take a while to make the symptoms better.  And also it is quite possible that her strength in the left might not improve.  Will follow-up with neurosurgery consultation/[facility].  # Leucocytosis-20 white count likely from steroids.  Taper steroids off.  # Prognosis: Had a long discussion regarding overall prognosis unfortunately poor/incurable disease.  Discussed median survival in May between 1 to 2 years.  If patient chooses not to proceed with any treatments-hospice would be recommended.  No decisions made.  Await NGS testing.  # DISPOSITION: # HOLD zometa # follow up Josh re;pain control mid- next week # follow up in 2 weeks- MD: labs- cbc/cmp; zometa-dr.B

## 2022-12-22 NOTE — Progress Notes (Signed)
[facility] CONSULT NOTE  Patient Care Team: [name], MD as PCP - General (Family Medicine) Glory Buff, RN as Oncology Nurse Navigator  CHIEF COMPLAINTS/PURPOSE OF CONSULTATION: Metastasis to bone.  Oncology History Overview Note  MARCH 2024- MRi cervical spine- IMPRESSION: 1. Findings compatible with multifocal osseous metastatic disease, as described above. 2. Abnormal pleuroparenchymal thickening/mass at the left lung apex, concerning for Pancoast tumor or less likely metastasis. This malignancy probably involves the left C7-T1 and T1-T2 foramina and encroaches on more inferior left thoracic foramina, incompletely imaged. Recommend dedicated CT of the chest (preferably with contrast) to further evaluate the chest. Also, dedicated MRI of the thoracic spine with contrast could further characterize thoracic extent if clinically warranted.  SOFT TISSUE NODULE, RIGHT FLANK; ULTRASOUND-GUIDED BIOPSY:  - POORLY DIFFERENTIATED CARCINOMA COMPATIBLE WITH METASTATIC  ADENOCARCINOMA OF LUNG ORIGIN.   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 Accompanied by family, son  Rexanne Inocencio 82 y.o.  female with longstanding history of smoking- DCIS; history of superficial early stage melanoma; and stage 1 thyroid  cancer-diagnosed left apical lung mass/multiple bone lesions on imaging is here to review results of her biopsy.  Patient underwent a biopsy of her abdominal soft tissue nodule.  Biopsy was uneventful.  Pt in for follow up and treatment today. Reports not feeling the best.   States she continues to not hand use of her left hand and experiences numbness and tingling "shoots" from her finger tips up her arm.   Appetite is fine. Denies any dyspnea or cough. Denies weight loss.  Review of Systems  Constitutional:  Negative for chills, diaphoresis, fever, malaise/fatigue and weight loss.  HENT:  Negative for nosebleeds and sore throat.   Eyes:  Negative for double vision.  Respiratory:  Negative for cough, hemoptysis, sputum production, shortness of breath and wheezing.   Cardiovascular:  Negative for chest pain, palpitations, orthopnea and leg swelling.  Gastrointestinal:  Negative for abdominal pain, blood in stool, constipation, diarrhea, heartburn, melena, nausea and vomiting.  Genitourinary:  Negative for dysuria, frequency and urgency.  Musculoskeletal:  Positive for neck pain. Negative for back pain and joint pain.  Skin: Negative.  Negative for itching and rash.  Neurological:  Positive for tingling, sensory change and focal weakness. Negative for dizziness, weakness and headaches.  Endo/Heme/Allergies:  Does not bruise/bleed easily.  Psychiatric/Behavioral:  Negative for depression. The patient is not nervous/anxious and does not have insomnia.     MEDICAL HISTORY:  Past Medical History:  Diagnosis Date   Actinic keratosis    Basal cell carcinoma 06/14/2020   posterior neck, [facility]   Breast cancer 2008   left   Cancer    melanoma   Complication of anesthesia    slow to wake up after surgery  GERD (gastroesophageal reflux disease)    Hypertension    Melanoma 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 Malignant melanoma, lentigo maligna type. 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    Personal history of radiation therapy    Left breast   Personal history of tobacco use, presenting hazards to health 05/31/2015   Tobacco abuse 01/27/2015   Tobacco abuse 01/27/2015    SURGICAL HISTORY: Past Surgical History:  Procedure Laterality Date   BREAST EXCISIONAL BIOPSY Left 2008   + radation   BREAST EXCISIONAL BIOPSY Right 2001 2010   neg surgical bx's   BREAST LUMPECTOMY Left 2008   w/radiation   CATARACT EXTRACTION W/PHACO Right 04/12/2016   Procedure: Cataract extraction phaco and intraocular lens placement right eye;  Surgeon: Lockie Mola, MD;  Location: [facility];  Service: Ophthalmology;  Laterality: Right;   CATARACT EXTRACTION W/PHACO Left 01/24/2017   Procedure: CATARACT EXTRACTION PHACO AND INTRAOCULAR LENS PLACEMENT (IOC) Complicated left;  Surgeon: Lockie Mola, MD;  Location: [facility];  Service: Ophthalmology;  Laterality: Left;  Complicated Malyugin   CHOLECYSTECTOMY     D&C and hysteroscopy  1996   LEEP  1996   TUBAL LIGATION      SOCIAL HISTORY: Social History   Socioeconomic History   Marital status: Married    Spouse name: Not on file   Number of children: Not on file   Years of education: Not on file   Highest education level: Not on file  Occupational History   Not on file  Tobacco Use   Smoking status: Every Day    Packs/day: 0.25    Years: 32.00    Additional pack years: 0.00    Total pack years: 8.00    Types: Cigarettes   Smokeless tobacco: Never  Substance and Sexual Activity   Alcohol use: No    Alcohol/week: 0.0 standard drinks of alcohol    Comment: occassional   Drug use: No   Sexual activity: Not Currently  Other Topics Concern   Not on file  Social History Narrative   Lives by self; husband in assisted living; Smoker since 80s [college]; rare alcohol. 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 2 children- [place]/ and [place].    Social Determinants of Health   Financial Resource Strain:  Not on file  Food Insecurity: No Food Insecurity (11/29/2022)   Hunger Vital Sign    Worried About Running Out of Food in the Last Year: Never true    Ran Out of Food in the Last Year: Never true  Transportation Needs: No Transportation Needs (11/29/2022)   PRAPARE - Administrator, Civil Service (Medical): No    Lack of Transportation (Non-Medical): No  Physical Activity: Not on file  Stress: Not on file  Social Connections: Not on file  Intimate Partner Violence: Not At Risk (11/29/2022)   Humiliation, Afraid, Rape, and Kick questionnaire    Fear of Current or Ex-Partner: No    Emotionally Abused: No    Physically Abused: No    Sexually Abused: No    FAMILY HISTORY: Family History  Problem Relation Age of Onset   Pancreatic cancer Father    Breast cancer Neg Hx     ALLERGIES:  is allergic to acetaminophen-pamabrom, codeine, and midol pm [diphenhydramine-apap (sleep)].  MEDICATIONS:  Current Outpatient Medications  Medication Sig Dispense Refill   acetaminophen (TYLENOL) 650 MG CR tablet Take 650 mg by mouth every 8 (eight) hours as needed for pain.     amLODipine (NORVASC)  2.5 MG tablet Take 2.5 mg by mouth daily.      calcium carbonate (OSCAL) 1500 (600 CA) MG TABS tablet Take 600 mg of elemental calcium by mouth 2 (two) times daily with a meal.     citalopram (CELEXA) 10 MG tablet Take 10 mg by mouth daily.     dexamethasone (DECADRON) 4 MG tablet Take 1 pill 3 times a day for 1 week.  And then 1 pill twice a day for 1 week; and then 1 pill a day. 45 tablet 0   gabapentin (NEURONTIN) 300 MG capsule Take 300 mg by mouth in the morning, at noon, and at bedtime.     morphine (MSIR) 15 MG tablet 1/2 -1 pill every 3-4 hours 30 tablet 0   triamterene-hydrochlorothiazide (DYAZIDE) 37.5-25 MG capsule Take 1 capsule by mouth daily.   1   lansoprazole (PREVACID) 30 MG capsule Take 1 capsule (30 mg total) by mouth daily at 12 noon. 30 capsule 1   meloxicam (MOBIC) 15 MG  tablet Take 1 tablet (15 mg total) by mouth daily. 30 tablet 3   ondansetron (ZOFRAN) 8 MG tablet One pill every 8 hours as needed for nausea/vomitting. (Patient not taking: Reported on 12/22/2022) 40 tablet 1   sucralfate (CARAFATE) 1 g tablet Take 1 tablet (1 g total) by mouth 3 (three) times daily. Dissolve tablet in 3to 4 tables spoons warm water swish and swallow 90 tablet 1   No current facility-administered medications for this visit.    PHYSICAL EXAMINATION:   Vitals:   12/22/22 0852  BP: (!) 113/59  Pulse: 79  Resp: 16  Temp: (!) 96.5 F (35.8 C)  SpO2: 97%     Filed Weights   12/22/22 0852  Weight: 124 lb (56.2 kg)    Patient has weakness of the left upper extremity especially hands.  Physical Exam Vitals and nursing note reviewed.  HENT:     Head: Normocephalic and atraumatic.     Mouth/Throat:     Pharynx: Oropharynx is clear.  Eyes:     Extraocular Movements: Extraocular movements intact.     Pupils: Pupils are equal, round, and reactive to light.  Cardiovascular:     Rate and Rhythm: Normal rate and regular rhythm.  Pulmonary:     Comments: Decreased breath sounds bilaterally.  Abdominal:     Palpations: Abdomen is soft.  Musculoskeletal:        General: Normal range of motion.     Cervical back: Normal range of motion.  Skin:    General: Skin is warm.  Neurological:     Mental Status: She is alert and oriented to person, place, and time.  Psychiatric:        Behavior: Behavior normal.        Judgment: Judgment normal.     LABORATORY DATA:  I have reviewed the data as listed Lab Results  Component Value Date   WBC 21.4 (H) 12/22/2022   HGB 13.9 12/22/2022   HCT 42.1 12/22/2022   MCV 94.2 12/22/2022   PLT 309 12/22/2022   Recent Labs    11/29/22 1231 12/22/22 0830  NA 137 133*  K 3.2* 3.7  CL 100 99  CO2 28 27  GLUCOSE 125* 80  BUN 21 26*  CREATININE 0.98 0.83  CALCIUM 9.0 8.6*  GFRNONAA 57* >60  PROT 7.1 6.6  ALBUMIN 3.6 3.5   AST 24 27  ALT 21 34  ALKPHOS 68 74  BILITOT 0.2* 0.4  RADIOGRAPHIC STUDIES: I have personally reviewed the radiological images as listed and agreed with the findings in the report. [place] CORE BIOPSY (LYMPH NODES)  Result Date: 12/15/2022 INDICATION: Cancer staging EXAM: Ultrasound-guided core needle biopsy of right flank subcutaneous soft tissue nodule MEDICATIONS: None. ANESTHESIA/SEDATION: Local analgesia COMPLICATIONS: None immediate. PROCEDURE: Informed written consent was obtained from the patient after a thorough discussion of the procedural risks, benefits and alternatives. All questions were addressed. Maximal Sterile Barrier Technique was utilized including caps, mask, sterile gowns, sterile gloves, sterile drape, hand hygiene and skin antiseptic. A timeout was performed prior to the initiation of the procedure. The patient was placed supine on the exam table. Focused sonographic exam of the left supraclavicular soft tissues was performed. A pathologically enlarged left supraclavicular lymph node was not identified. Attention was then turned to the right flank soft tissues, as there was a PET positive soft tissue nodule on recent PET-CT suspicious for metastatic involvement. The patient was placed prone on the exam table. Ultrasound of the right flank subcutaneous soft tissues demonstrated a predominantly hypoechoic soft tissue nodule in the subcutaneous fat measuring to 1.5 cm, corresponding to recent PET-CT findings. Skin entry site was marked, and the overlying skin was prepped draped in the standard sterile fashion. Local analgesia was obtained with 1% lidocaine. Using ultrasound guidance, core needle biopsy was performed of the right flank subcutaneous soft tissue nodule using an 18 gauge core biopsy device x4 total passes. Specimens were submitted in formalin to pathology for further handling. Limited postprocedure imaging demonstrated no hematoma. A clean dressing was placed after manual  hemostasis. The patient tolerated the procedure well without immediate complication. IMPRESSION: Successful ultrasound-guided core needle biopsy of right flank subcutaneous soft tissue nodule corresponding to recent PET-CT findings. Electronically Signed   By: Olive Bass M.D.   On: 12/15/2022 14:29   MR THORACIC SPINE W WO CONTRAST  Result Date: 12/11/2022 CLINICAL DATA:  History of lung mass, evaluate osseous metastatic disease. EXAM: MRI THORACIC WITHOUT AND WITH CONTRAST TECHNIQUE: Multiplanar and multiecho pulse sequences of the thoracic spine were obtained without and with intravenous contrast. CONTRAST:  5mL GADAVIST GADOBUTROL 1 MMOL/ML IV SOLN COMPARISON:  PET-CT 12/04/2022, cervical spine MRI 11/21/2022 FINDINGS: Alignment:  Normal. Vertebrae: Vertebral body heights are preserved. Background marrow signal is normal There is T2 hypointensity with enhancement in the C6 through T5 vertebral bodies consistent with osseous metastatic disease. There is involvement of the posterior elements most notably at C7 and T1 on the left and T2 bilaterally. 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 There is tumor in the left C7-T1 and T1-T2 neural foramina which likely affects the exiting C8 and T1 nerve roots. A small amount of tumor may also encroach on the exiting left T2 nerve root (20-14). Surrounding abnormal soft tissue enhancement is contiguous with the primary left apical lung mass. There is osseous involvement in the left first and second ribs. Additional thin epidural tumor measuring 1-2 mm along the left dorsal aspect of the T5 posterior endplate (27-11, 26-19). The above-described epidural tumor does not result in high-grade spinal canal stenosis or cord compression. There is no evidence of pathologic fracture. Cord:  Normal in signal and morphology without abnormal enhancement. Paraspinal and other  soft tissues: The left apical soft tissue mass and left paraspinal tumor is described above. The paraspinal soft tissues are otherwise unremarkable. Disc levels: The disc  heights are overall preserved. There is no significant spinal canal or neural foraminal stenosis, aside from tumoral involvement of the left neural foramina in the upper thoracic spine as detailed above. IMPRESSION: 1. 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 No high-grade spinal canal stenosis or cord compression. 2. Tumor in the left C7-T1 and T1-T2 neural foramina likely affects the exiting C8 and T1 nerve roots, contiguous with the left apical primary tumor. A small amount of tumor may also encroach on the exiting left T2 nerve root. 3. Tumor involves the left first and second ribs. 4. No evidence of pathologic fracture. Electronically Signed   By: Lesia Hausen M.D.   On: 12/11/2022 16:47   NM PET Image Initial (PI) Skull Base To Thigh  Result Date: 12/05/2022 CLINICAL DATA:  Initial treatment strategy for lung mass. EXAM: NUCLEAR MEDICINE PET SKULL BASE TO THIGH TECHNIQUE: 6.8 mCi F-18 FDG was injected intravenously. Full-ring PET imaging was performed from the skull base to thigh after the radiotracer. CT data was obtained and used for attenuation correction and anatomic localization. Fasting blood glucose: 126 mg/dl COMPARISON:  None Available. FINDINGS: Mediastinal blood pool activity: SUV max 2.6 Liver activity: SUV max NA NECK: No hypermetabolic lymph nodes in the neck. Incidental CT findings: None. CHEST: Hypermetabolic soft tissue mass of the left lung apex with associated hypermetabolic left apical pleural thickening measuring 4.6 x 2.9 cm on series 4, image 29 with SUV max of 14. Enlarged and hypermetabolic mediastinal lymph nodes. Reference pre-vascular lymph node measuring 1.4 cm in short  axis on series 4, image 37 with SUV max of 18.1. Reference AP window lymph node measuring 1.5 cm in short axis on image 43 with SUV max of 13.8. Hypermetabolic left supraclavicular lymph node measuring 0.6 cm in short axis on image 31 with SUV max of 7.3. Incidental CT findings: Calcification of the left breast, likely sequela of fat necrosis. Normal caliber thoracic aorta with moderate atherosclerotic disease. Small to moderate hiatal hernia. Trace left pleural effusion bibasilar atelectasis. Small right Bochdalek hernia. Numerous ground-glass nodular opacities are seen in the bilateral upper lobes, largest nodules demonstrate minimal FDG uptake. Reference ground-glass nodule of the right upper lobe measuring 4.0 x 2.4 cm on series 4, image 52, previously measured 2.2 x 1.9 cm, SUV max of 1.5. ABDOMEN/PELVIS: Hypermetabolic subcutaneous soft tissue nodule of the right lower back measuring 0.7 cm on series 4, image 94 with SUV max of 9.3. No abnormal hypermetabolic activity within the liver, pancreas, adrenal glands, or spleen. No hypermetabolic lymph nodes in the abdomen or pelvis. Incidental CT findings: Cholecystectomy clips. Simple appearing right renal cysts, no specific follow-up imaging is recommended for this finding. Atherosclerotic disease of the abdominal aorta. Diverticulosis. SKELETON: Hypermetabolic osseous lesions of the of the C7-T3 vertebral bodies with hypermetabolic soft tissue involving the left C7-T1 and left T1-T2 foramina, SUV max of the T3 vertebral body is 13.8. More mild focal areas of seen involving the posterior elements of T4 and posterior T5 vertebral body. Multiple hypermetabolic anterolateral rib lesions with associated rib fractures involving the fifth through eighth ribs, likely posttraumatic. Incidental CT findings: None. IMPRESSION: 1. Hypermetabolic soft tissue mass of the left lung apex with associated hypermetabolic left apical pleural thickening, compatible with primary lung  malignancy. 2. Enlarged and hypermetabolic mediastinal and left supraclavicular lymph nodes, compatible with metastatic disease. 3.  Hypermetabolic osseous metastatic disease of the lower cervical and upper thoracic spine, see separately dictated MRI of the cervical spine dated November 21, 2022 for more complete discussion of foramina involvement. 4. Hypermetabolic subcutaneous soft tissue nodule of the right lower back, concerning for soft tissue metastatic disease. 5. Numerous ground-glass nodular opacities are seen in the bilateral upper lobes, largest nodules demonstrate minimal FDG uptake and are increased in size when compared with November 17, 2015 prior, likely due to multifocal indolent primary lung adenocarcinoma. Recommend dedicated chest CT for further evaluation. 6. Multiple hypermetabolic anterolateral right rib lesions which correlate with rib fractures, likely posttraumatic. Electronically Signed   By: Allegra LaiLeah  Strickland M.D.   On: 12/05/2022 16:29     Primary cancer of left upper lobe of lung # Left upper lobe lung cancer-# MARCH 5th 2024- MRI cervical spine- concerning for metastatic disease to the multiple vertebral bodies/bone; also involvement of the neural foramina; with left apical lung mass. PET March, 19th, 2024-  Hypermetabolic soft tissue mass of the left lung apex with associated hypermetabolic left apical pleural thickening, compatible with primary lung malignancy. Enlarged and hypermetabolic mediastinal and left supraclavicular lymph nodes, compatible with metastatic disease. Hypermetabolic osseous metastatic disease of the lower cervical and upper thoracic spine. Hypermetabolic subcutaneous soft tissue nodule of the right lower back, concerning for soft tissue metastatic disease. Numerous ground-glass nodular opacities are seen in the bilateral upper lobes, largest nodules demonstrate minimal FDG uptake and are increased in size  SOFT TISSUE NODULE, RIGHT FLANK; ULTRASOUND-GUIDED BIOPSY: -  POORLY DIFFERENTIATED CARCINOMA COMPATIBLE WITH METASTATIC  ADENOCARCINOMA OF LUNG ORIGIN.   # I reviewed with the pathology and stage with the patient and family.  Unfortunately given advanced malignancy-the goal of treatment would be palliative/not curative.  Discussed the options include chemoimmunotherapy versus chemotherapy versus novel targeted therapy [unlikely given her smoking history; however NGS pending].  NGS pending at this time. Also discussed at tumor conference.    # I also discussed the potential side effects including but not limited to-increasing fatigue, nausea vomiting, diarrhea, hair loss, sores in the mouth, increase risk of infection and also neuropathy.   # Active smoker-5 cigarettes a day.  Will discuss smoking counseling further at next visit.   # Neck/left arm pain-secondary malignancy.  Cervical spine MRI-March 2024 no evidence of any cord compression.  Currently on gabapentin'/tylenol prn.  Poor tolerance according/tramadol.  Hold Zometa given calcium 8.6.Recommend ca+Vit D BID. # pain control: meloxicam 30 mg/day; continue dex 4 mg day.  After lengthy discussion patient finally agrees to go up on the dose of meloxicam.  And also consider narcotic.  Recommend morphine low-dose.  Continue radiation [started on 12/19/2022].  Recommend evaluation with Josh.  They understand that radiation might take a while to make the symptoms better.  And also it is quite possible that her strength in the left might not improve.  Will follow-up with neurosurgery consultation/Haley.  # Leucocytosis-20 white count likely from steroids.  Taper steroids off.  # Prognosis: Had a long discussion regarding overall prognosis unfortunately poor/incurable disease.  Discussed median survival in May between 1 to 2 years.  If patient chooses not to proceed with any treatments-hospice would be recommended.  No decisions made.  Await NGS testing.  # DISPOSITION: # HOLD zometa # follow up Josh re;pain  control mid- next week # follow up in 2 weeks- MD: labs- cbc/cmp; zometa-dr.B     Earna CoderGovinda R Marai Teehan, MD 12/22/2022 1:53 PM

## 2022-12-25 ENCOUNTER — Other Ambulatory Visit: Payer: Self-pay

## 2022-12-25 ENCOUNTER — Inpatient Hospital Stay: Payer: Medicare Other

## 2022-12-25 ENCOUNTER — Ambulatory Visit
Admission: RE | Admit: 2022-12-25 | Discharge: 2022-12-25 | Disposition: A | Discharge status: Home / Self Care | Payer: Medicare Other | Source: Ambulatory Visit | Attending: Radiation Oncology | Admitting: Radiation Oncology

## 2022-12-25 DIAGNOSIS — Z510 Encounter for antineoplastic radiation therapy: Secondary | ICD-10-CM | POA: Diagnosis not present

## 2022-12-25 LAB — RAD ONC ARIA SESSION SUMMARY
Course Elapsed Days: 6
Plan Fractions Treated to Date: 5
Plan Prescribed Dose Per Fraction: 2 Gy
Plan Total Fractions Prescribed: 20
Plan Total Prescribed Dose: 40 Gy
Reference Point Dosage Given to Date: 10 Gy
Reference Point Session Dosage Given: 2 Gy
Session Number: 5

## 2022-12-26 ENCOUNTER — Ambulatory Visit
Admission: RE | Admit: 2022-12-26 | Discharge: 2022-12-26 | Disposition: A | Discharge status: Home / Self Care | Payer: Medicare Other | Source: Ambulatory Visit | Attending: Radiation Oncology | Admitting: Radiation Oncology

## 2022-12-26 ENCOUNTER — Other Ambulatory Visit: Payer: Self-pay

## 2022-12-26 DIAGNOSIS — Z510 Encounter for antineoplastic radiation therapy: Secondary | ICD-10-CM | POA: Diagnosis not present

## 2022-12-26 LAB — RAD ONC ARIA SESSION SUMMARY
Course Elapsed Days: 7
Plan Fractions Treated to Date: 6
Plan Prescribed Dose Per Fraction: 2 Gy
Plan Total Fractions Prescribed: 20
Plan Total Prescribed Dose: 40 Gy
Reference Point Dosage Given to Date: 12 Gy
Reference Point Session Dosage Given: 2 Gy
Session Number: 6

## 2022-12-27 ENCOUNTER — Encounter: Payer: Self-pay | Admitting: Internal Medicine

## 2022-12-27 ENCOUNTER — Ambulatory Visit
Admission: RE | Admit: 2022-12-27 | Discharge: 2022-12-27 | Disposition: A | Discharge status: Home / Self Care | Payer: Medicare Other | Source: Ambulatory Visit | Attending: Radiation Oncology | Admitting: Radiation Oncology

## 2022-12-27 ENCOUNTER — Encounter: Payer: Self-pay | Admitting: Hospice and Palliative Medicine

## 2022-12-27 ENCOUNTER — Encounter: Payer: Self-pay | Admitting: *Deleted

## 2022-12-27 ENCOUNTER — Inpatient Hospital Stay (HOSPITAL_BASED_OUTPATIENT_CLINIC_OR_DEPARTMENT_OTHER): Payer: Medicare Other | Admitting: Hospice and Palliative Medicine

## 2022-12-27 ENCOUNTER — Other Ambulatory Visit: Payer: Self-pay

## 2022-12-27 VITALS — BP 115/64 | HR 62 | Temp 97.6°F | Wt 120.0 lb

## 2022-12-27 DIAGNOSIS — Z515 Encounter for palliative care: Secondary | ICD-10-CM

## 2022-12-27 DIAGNOSIS — C3412 Malignant neoplasm of upper lobe, left bronchus or lung: Secondary | ICD-10-CM

## 2022-12-27 DIAGNOSIS — Z510 Encounter for antineoplastic radiation therapy: Secondary | ICD-10-CM | POA: Diagnosis not present

## 2022-12-27 LAB — RAD ONC ARIA SESSION SUMMARY
Course Elapsed Days: 8
Plan Fractions Treated to Date: 7
Plan Prescribed Dose Per Fraction: 2 Gy
Plan Total Fractions Prescribed: 20
Plan Total Prescribed Dose: 40 Gy
Reference Point Dosage Given to Date: 14 Gy
Reference Point Session Dosage Given: 2 Gy
Session Number: 7

## 2022-12-27 NOTE — Progress Notes (Signed)
Palliative Medicine [facility] at [facility] Telephone:([phone]) [phone] Fax:(336) [phone]   Name: Crystal Mcmahon Date: 12/27/2022 MRN: [id number]  DOB: February 19, 1941  Patient Care Team: [name], MD as PCP - General (Family Medicine) Glory Buff, RN as Oncology Nurse Navigator    REASON FOR CONSULTATION: Crystal Mcmahon is a 82 y.o. 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  Patient has had ongoing neck/left arm pain secondary to malignancy.  Cervical spine MRI in March 2024 showed no evidence of cord compression.  She was referred to palliative care to address goals and manage ongoing symptoms.  SOCIAL HISTORY:     reports that she has been smoking cigarettes. She has a 8.00 pack-year smoking history. She has never used smokeless tobacco. She reports that she does not drink alcohol and does not use drugs.  Patient is married but her husband lives in an assisted living facility.  Patient lives at home alone.  She has a son in [place] and a daughter in [place].  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  ADVANCE DIRECTIVES:    CODE STATUS:   PAST MEDICAL HISTORY: Past Medical History:  Diagnosis Date   Actinic keratosis    Basal cell carcinoma 06/14/2020   posterior neck, EDC   Breast cancer 2008   left   Cancer    melanoma   Complication of anesthesia    slow to wake up after surgery   GERD (gastroesophageal reflux disease)    Hypertension    Melanoma 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 Malignant melanoma, lentigo maligna type. Clark's level II, Breslow's 0.16mm   Personal history of radiation therapy    Left breast   Personal history of tobacco use, presenting hazards to health 05/31/2015   Tobacco abuse 01/27/2015   Tobacco abuse 01/27/2015    PAST SURGICAL HISTORY:  Past Surgical History:  Procedure Laterality Date   BREAST EXCISIONAL BIOPSY Left 2008   +  radation   BREAST EXCISIONAL BIOPSY Right 2001 2010   neg surgical bx's   BREAST LUMPECTOMY Left 2008   w/radiation   CATARACT EXTRACTION W/PHACO Right 04/12/2016   Procedure: Cataract extraction phaco and intraocular lens placement right eye;  Surgeon: Lockie Mola, MD;  Location: [facility];  Service: Ophthalmology;  Laterality: Right;   CATARACT EXTRACTION W/PHACO Left 01/24/2017   Procedure: CATARACT EXTRACTION PHACO AND INTRAOCULAR LENS PLACEMENT (IOC) Complicated left;  Surgeon: Lockie Mola, MD;  Location: [facility];  Service: Ophthalmology;  Laterality: Left;  Complicated Malyugin   CHOLECYSTECTOMY     D&C and hysteroscopy  1996   LEEP  1996   TUBAL LIGATION      HEMATOLOGY/ONCOLOGY HISTORY:  Oncology History Overview Note  MARCH 2024- MRi cervical spine- IMPRESSION: 1. Findings compatible with multifocal osseous metastatic disease, as described above. 2. Abnormal pleuroparenchymal thickening/mass at the left lung apex, concerning for Pancoast tumor or less likely metastasis. This malignancy probably involves the left C7-T1 and T1-T2 foramina and encroaches on more inferior left thoracic foramina, incompletely imaged. Recommend dedicated CT of the chest (preferably with contrast) to further evaluate the chest. Also, dedicated MRI of the thoracic spine with contrast could further characterize thoracic extent if clinically warranted.  SOFT TISSUE NODULE, RIGHT FLANK; ULTRASOUND-GUIDED BIOPSY:  - POORLY DIFFERENTIATED CARCINOMA COMPATIBLE WITH METASTATIC  ADENOCARCINOMA OF LUNG ORIGIN.   Hx DCIS; history of superficial early stage melanoma; and stage 1 thyroid  cancer      Malignant neoplasm of breast  10/05/2006 Initial Diagnosis   Malignant neoplasm of left breast (HCC), Tis N0 M0, ER positive PR negative   10/22/2006 Surgery   Lumpectomy   10/22/2006 -  Radiation Therapy     06/22/2007 - 06/21/2012 Anti-estrogen oral therapy    Arimidex   Primary cancer of left upper lobe of lung  12/22/2022 Initial Diagnosis   Primary cancer of left upper lobe of lung   12/22/2022 Cancer Staging   Staging form: Lung, AJCC 8th Edition - Clinical: Stage IVB (cT2, cN2, cM1c) - Signed by Earna Coder, MD on 12/22/2022     ALLERGIES:  is allergic to acetaminophen-pamabrom, codeine, and midol pm [diphenhydramine-apap (sleep)].  MEDICATIONS:  Current Outpatient Medications  Medication Sig Dispense Refill   acetaminophen (TYLENOL) 650 MG CR tablet Take 650 mg by mouth every 8 (eight) hours as needed for pain.     amLODipine (NORVASC) 2.5 MG tablet Take 2.5 mg by mouth daily.      calcium carbonate (OSCAL) 1500 (600 CA) MG TABS tablet Take 600 mg of elemental calcium by mouth 2 (two) times daily with a meal.     citalopram (CELEXA) 10 MG tablet Take 10 mg by mouth daily.     dexamethasone (DECADRON) 4 MG tablet Take 1 pill 3 times a day for 1 week.  And then 1 pill twice a day for 1 week; and then 1 pill a day. 45 tablet 0   gabapentin (NEURONTIN) 300 MG capsule Take 300 mg by mouth in the morning, at noon, and at bedtime.     lansoprazole (PREVACID) 30 MG capsule Take 1 capsule (30 mg total) by mouth daily at 12 noon. 30 capsule 1   meloxicam (MOBIC) 15 MG tablet Take 1 tablet (15 mg total) by mouth daily. 30 tablet 3   morphine (MSIR) 15 MG tablet 1/2 -1 pill every 3-4 hours 30 tablet 0   sucralfate (CARAFATE) 1 g tablet Take 1 tablet (1 g total) by mouth 3 (three) times daily. Dissolve tablet in 3to 4 tables spoons warm water swish and swallow 90 tablet 1   triamterene-hydrochlorothiazide (DYAZIDE) 37.5-25 MG capsule Take 1 capsule by mouth daily.   1   ondansetron (ZOFRAN) 8 MG tablet One pill every 8 hours as needed for nausea/vomitting. (Patient not taking: Reported on 12/22/2022) 40 tablet 1   No current facility-administered medications for this visit.    VITAL SIGNS: BP 115/64 (BP Location: Right Arm, Patient Position:  Sitting)   Pulse 62   Temp 97.6 F (36.4 C) (Tympanic)   Wt 120 lb (54.4 kg)   SpO2 98%   BMI 22.67 kg/m  Filed Weights   12/27/22 1311  Weight: 120 lb (54.4 kg)    Estimated body mass index is 22.67 kg/m as calculated from the following:   Height as of 12/04/22: 5\' 1"  (1.549 m).   Weight as of this encounter: 120 lb (54.4 kg).  LABS: CBC:    Component Value Date/Time   WBC 21.4 (H) 12/22/2022 0830   WBC 7.1 02/01/2017 0830   HGB 13.9 12/22/2022 0830   HGB 14.2 12/30/2014 1033   HCT 42.1 12/22/2022 0830   HCT 42.6 12/30/2014 1033   PLT 309 12/22/2022 0830   PLT 266 12/30/2014 1033   MCV 94.2 12/22/2022 0830   MCV 92 12/30/2014 1033   NEUTROABS 16.4 (H) 12/22/2022 0830   NEUTROABS 5.4 12/30/2014 1033   LYMPHSABS 2.5 12/22/2022 0830  LYMPHSABS 1.8 12/30/2014 1033   MONOABS 2.1 (H) 12/22/2022 0830   MONOABS 0.5 12/30/2014 1033   EOSABS 0.1 12/22/2022 0830   EOSABS 0.0 12/30/2014 1033   BASOSABS 0.1 12/22/2022 0830   BASOSABS 0.1 12/30/2014 1033   Comprehensive Metabolic Panel:    Component Value Date/Time   NA 133 (L) 12/22/2022 0830   NA 136 12/30/2014 1033   K 3.7 12/22/2022 0830   K 3.0 (L) 12/30/2014 1033   CL 99 12/22/2022 0830   CL 100 (L) 12/30/2014 1033   CO2 27 12/22/2022 0830   CO2 30 12/30/2014 1033   BUN 26 (H) 12/22/2022 0830   BUN 21 (H) 12/30/2014 1033   CREATININE 0.83 12/22/2022 0830   CREATININE 0.99 12/30/2014 1033   GLUCOSE 80 12/22/2022 0830   GLUCOSE 117 (H) 12/30/2014 1033   CALCIUM 8.6 (L) 12/22/2022 0830   CALCIUM 9.2 12/30/2014 1033   AST 27 12/22/2022 0830   ALT 34 12/22/2022 0830   ALT 15 12/30/2014 1033   ALKPHOS 74 12/22/2022 0830   ALKPHOS 47 12/30/2014 1033   BILITOT 0.4 12/22/2022 0830   PROT 6.6 12/22/2022 0830   PROT 7.3 12/30/2014 1033   ALBUMIN 3.5 12/22/2022 0830   ALBUMIN 4.1 12/30/2014 1033    RADIOGRAPHIC STUDIES: US CORE BIOPSY (LYMPH NODES)  Result Date: 12/15/2022 INDICATION: Cancer staging EXAM:  Ultrasound-guided core needle biopsy of right flank subcutaneous soft tissue nodule MEDICATIONS: None. ANESTHESIA/SEDATION: Local analgesia COMPLICATIONS: None immediate. PROCEDURE: Informed written consent was obtained from the patient after a thorough discussion of the procedural risks, benefits and alternatives. All questions were addressed. Maximal Sterile Barrier Technique was utilized including caps, mask, sterile gowns, sterile gloves, sterile drape, hand hygiene and skin antiseptic. A timeout was performed prior to the initiation of the procedure. The patient was placed supine on the exam table. Focused sonographic exam of the left supraclavicular soft tissues was performed. A pathologically enlarged left supraclavicular lymph node was not identified. Attention was then turned to the right flank soft tissues, as there was a PET positive soft tissue nodule on recent PET-CT suspicious for metastatic involvement. The patient was placed prone on the exam table. Ultrasound of the right flank subcutaneous soft tissues demonstrated a predominantly hypoechoic soft tissue nodule in the subcutaneous fat measuring to 1.5 cm, corresponding to recent PET-CT findings. Skin entry site was marked, and the overlying skin was prepped draped in the standard sterile fashion. Local analgesia was obtained with 1% lidocaine. Using ultrasound guidance, core needle biopsy was performed of the right flank subcutaneous soft tissue nodule using an 18 gauge core biopsy device x4 total passes. Specimens were submitted in formalin to pathology for further handling. Limited postprocedure imaging demonstrated no hematoma. A clean dressing was placed after manual hemostasis. The patient tolerated the procedure well without immediate complication. IMPRESSION: Successful ultrasound-guided core needle biopsy of right flank subcutaneous soft tissue nodule corresponding to recent PET-CT findings. Electronically Signed   By: Olive BassYasser  El-Abd M.D.    On: 12/15/2022 14:29   MR THORACIC SPINE W WO CONTRAST  Result Date: 12/11/2022 CLINICAL DATA:  History of lung mass, evaluate osseous metastatic disease. EXAM: MRI THORACIC WITHOUT AND WITH CONTRAST TECHNIQUE: Multiplanar and multiecho pulse sequences of the thoracic spine were obtained without and with intravenous contrast. CONTRAST:  5mL GADAVIST GADOBUTROL 1 MMOL/ML IV SOLN COMPARISON:  PET-CT 12/04/2022, cervical spine MRI 11/21/2022 FINDINGS: Alignment:  Normal. Vertebrae: Vertebral body heights are preserved. Background marrow signal is normal There is T2 hypointensity  with enhancement in the C6 through T5 vertebral bodies consistent with osseous metastatic disease. There is involvement of the posterior elements most notably at C7 and T1 on the left and T2 bilaterally. 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 There is tumor in the left C7-T1 and T1-T2 neural foramina which likely affects the exiting C8 and T1 nerve roots. A small amount of tumor may also encroach on the exiting left T2 nerve root (20-14). Surrounding abnormal soft tissue enhancement is contiguous with the primary left apical lung mass. There is osseous involvement in the left first and second ribs. Additional thin epidural tumor measuring 1-2 mm along the left dorsal aspect of the T5 posterior endplate (27-11, 26-19). The above-described epidural tumor does not result in high-grade spinal canal stenosis or cord compression. There is no evidence of pathologic fracture. Cord:  Normal in signal and morphology without abnormal enhancement. Paraspinal and other soft tissues: The left apical soft tissue mass and left paraspinal tumor is described above. The paraspinal soft tissues are otherwise unremarkable. Disc levels: The disc heights are overall preserved. There is no significant spinal canal or neural foraminal stenosis, aside from  tumoral involvement of the left neural foramina in the upper thoracic spine as detailed above. IMPRESSION: 1. 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 No high-grade spinal canal stenosis or cord compression. 2. Tumor in the left C7-T1 and T1-T2 neural foramina likely affects the exiting C8 and T1 nerve roots, contiguous with the left apical primary tumor. A small amount of tumor may also encroach on the exiting left T2 nerve root. 3. Tumor involves the left first and second ribs. 4. No evidence of pathologic fracture. Electronically Signed   By: Lesia Hausen M.D.   On: 12/11/2022 16:47   NM PET Image Initial (PI) Skull Base To Thigh  Result Date: 12/05/2022 CLINICAL DATA:  Initial treatment strategy for lung mass. EXAM: NUCLEAR MEDICINE PET SKULL BASE TO THIGH TECHNIQUE: 6.8 mCi F-18 FDG was injected intravenously. Full-ring PET imaging was performed from the skull base to thigh after the radiotracer. CT data was obtained and used for attenuation correction and anatomic localization. Fasting blood glucose: 126 mg/dl COMPARISON:  None Available. FINDINGS: Mediastinal blood pool activity: SUV max 2.6 Liver activity: SUV max NA NECK: No hypermetabolic lymph nodes in the neck. Incidental CT findings: None. CHEST: Hypermetabolic soft tissue mass of the left lung apex with associated hypermetabolic left apical pleural thickening measuring 4.6 x 2.9 cm on series 4, image 29 with SUV max of 14. Enlarged and hypermetabolic mediastinal lymph nodes. Reference pre-vascular lymph node measuring 1.4 cm in short axis on series 4, image 37 with SUV max of 18.1. Reference AP window lymph node measuring 1.5 cm in short axis on image 43 with SUV max of 13.8. Hypermetabolic left supraclavicular lymph node measuring 0.6 cm in short axis on image 31 with SUV max of 7.3. Incidental CT findings:  Calcification of the left breast, likely sequela of fat necrosis. Normal caliber thoracic aorta with moderate atherosclerotic disease. Small to moderate hiatal hernia. Trace left pleural effusion bibasilar atelectasis. Small right Bochdalek hernia. Numerous ground-glass nodular opacities are seen in the bilateral upper lobes, largest nodules demonstrate minimal FDG uptake. Reference ground-glass nodule of the right  upper lobe measuring 4.0 x 2.4 cm on series 4, image 52, previously measured 2.2 x 1.9 cm, SUV max of 1.5. ABDOMEN/PELVIS: Hypermetabolic subcutaneous soft tissue nodule of the right lower back measuring 0.7 cm on series 4, image 94 with SUV max of 9.3. No abnormal hypermetabolic activity within the liver, pancreas, adrenal glands, or spleen. No hypermetabolic lymph nodes in the abdomen or pelvis. Incidental CT findings: Cholecystectomy clips. Simple appearing right renal cysts, no specific follow-up imaging is recommended for this finding. Atherosclerotic disease of the abdominal aorta. Diverticulosis. SKELETON: Hypermetabolic osseous lesions of the of the C7-T3 vertebral bodies with hypermetabolic soft tissue involving the left C7-T1 and left T1-T2 foramina, SUV max of the T3 vertebral body is 13.8. More mild focal areas of seen involving the posterior elements of T4 and posterior T5 vertebral body. Multiple hypermetabolic anterolateral rib lesions with associated rib fractures involving the fifth through eighth ribs, likely posttraumatic. Incidental CT findings: None. IMPRESSION: 1. Hypermetabolic soft tissue mass of the left lung apex with associated hypermetabolic left apical pleural thickening, compatible with primary lung malignancy. 2. Enlarged and hypermetabolic mediastinal and left supraclavicular lymph nodes, compatible with metastatic disease. 3. Hypermetabolic osseous metastatic disease of the lower cervical and upper thoracic spine, see separately dictated MRI of the cervical spine dated  November 21, 2022 for more complete discussion of foramina involvement. 4. Hypermetabolic subcutaneous soft tissue nodule of the right lower back, concerning for soft tissue metastatic disease. 5. Numerous ground-glass nodular opacities are seen in the bilateral upper lobes, largest nodules demonstrate minimal FDG uptake and are increased in size when compared with November 17, 2015 prior, likely due to multifocal indolent primary lung adenocarcinoma. Recommend dedicated chest CT for further evaluation. 6. Multiple hypermetabolic anterolateral right rib lesions which correlate with rib fractures, likely posttraumatic. Electronically Signed   By: Allegra Lai M.D.   On: 12/05/2022 16:29    PERFORMANCE STATUS (ECOG) : 1 - Symptomatic but completely ambulatory  Review of Systems Unless otherwise noted, a complete review of systems is negative.  Physical Exam General: NAD Pulmonary: Unlabored Extremities: no edema, no joint deformities Skin: no rashes Neurological: Weakness left upper extremity  IMPRESSION: I met with patient and son today in clinic.  Introduced palliative care and attempted to establish therapeutic rapport.  Patient reports that she is doing reasonably well.  She denies any acute symptomatic concerns today.  She states that her pain is greatly improved after starting MS IR.  She has been taking half a tablet in the morning and a full tablet at bedtime and finding that her sleep quality and quantity have greatly improved on this regimen.  She denies any adverse effects.  We discussed maintaining a bowel regimen to prevent opioid-induced constipation.  Patient reports robust appetite on dexamethasone but has had mild weight loss.  Will trend weights and can consider referral to nutrition if needed.  At baseline, patient lives at home alone.  Her son is currently staying with her temporarily but he lives in [place].  Patient has left upper extremity weakness and is found that somewhat  limiting but has maintained her independence with ADLs.  Discussed option of referral to [facility] to discuss adaptive strategies but patient wanted to hold off on referral for now.  Of note, patient tells me that her daughter is currently under a clinical trial for glioblastoma.  Patient is currently in process of completing ACP documents and would want her son to serve as her healthcare power of attorney.  I sent patient home with a MOST form to review.  PLAN: -Continue current scope of treatment -Continue MS IR -Daily bowel regimen -Patient/family completing ACP documents -MOST Form reviewed -Follow-up telephone visit 3 to 4 weeks   Patient expressed understanding and was in agreement with this plan. She also understands that She can call the clinic at any time with any questions, concerns, or complaints.     Time Total: 15 minutes  Visit consisted of counseling and education dealing with the complex and emotionally intense issues of symptom management and palliative care in the setting of serious and potentially life-threatening illness.Greater than 50%  of this time was spent counseling and coordinating care related to the above assessment and plan.  Signed by: Laurette Schimke, PhD, NP-C

## 2022-12-28 ENCOUNTER — Other Ambulatory Visit: Payer: Self-pay

## 2022-12-28 ENCOUNTER — Inpatient Hospital Stay: Payer: Medicare Other

## 2022-12-28 ENCOUNTER — Other Ambulatory Visit: Payer: Medicare Other

## 2022-12-28 ENCOUNTER — Ambulatory Visit
Admission: RE | Admit: 2022-12-28 | Discharge: 2022-12-28 | Disposition: A | Discharge status: Home / Self Care | Payer: Medicare Other | Source: Ambulatory Visit | Attending: Radiation Oncology | Admitting: Radiation Oncology

## 2022-12-28 DIAGNOSIS — Z510 Encounter for antineoplastic radiation therapy: Secondary | ICD-10-CM | POA: Diagnosis not present

## 2022-12-28 DIAGNOSIS — C419 Malignant neoplasm of bone and articular cartilage, unspecified: Secondary | ICD-10-CM

## 2022-12-28 LAB — RAD ONC ARIA SESSION SUMMARY
Course Elapsed Days: 9
Plan Fractions Treated to Date: 8
Plan Prescribed Dose Per Fraction: 2 Gy
Plan Total Fractions Prescribed: 20
Plan Total Prescribed Dose: 40 Gy
Reference Point Dosage Given to Date: 16 Gy
Reference Point Session Dosage Given: 2 Gy
Session Number: 8

## 2022-12-28 LAB — CBC (CANCER CENTER ONLY)
HCT: 38.8 % (ref 36.0–46.0)
Hemoglobin: 12.4 g/dL (ref 12.0–15.0)
MCH: 30.9 pg (ref 26.0–34.0)
MCHC: 32 g/dL (ref 30.0–36.0)
MCV: 96.8 fL (ref 80.0–100.0)
Platelet Count: 244 10*3/uL (ref 150–400)
RBC: 4.01 MIL/uL (ref 3.87–5.11)
RDW: 14.5 % (ref 11.5–15.5)
WBC Count: 12.2 10*3/uL — ABNORMAL HIGH (ref 4.0–10.5)
nRBC: 0 % (ref 0.0–0.2)

## 2022-12-29 ENCOUNTER — Other Ambulatory Visit: Payer: Self-pay

## 2022-12-29 ENCOUNTER — Ambulatory Visit
Admission: RE | Admit: 2022-12-29 | Discharge: 2022-12-29 | Disposition: A | Discharge status: Home / Self Care | Payer: Medicare Other | Source: Ambulatory Visit | Attending: Radiation Oncology | Admitting: Radiation Oncology

## 2022-12-29 DIAGNOSIS — Z510 Encounter for antineoplastic radiation therapy: Secondary | ICD-10-CM | POA: Diagnosis not present

## 2022-12-29 LAB — RAD ONC ARIA SESSION SUMMARY
Course Elapsed Days: 10
Plan Fractions Treated to Date: 9
Plan Prescribed Dose Per Fraction: 2 Gy
Plan Total Fractions Prescribed: 20
Plan Total Prescribed Dose: 40 Gy
Reference Point Dosage Given to Date: 18 Gy
Reference Point Session Dosage Given: 2 Gy
Session Number: 9

## 2023-01-01 ENCOUNTER — Encounter: Payer: Self-pay | Admitting: Internal Medicine

## 2023-01-01 ENCOUNTER — Ambulatory Visit
Admission: RE | Admit: 2023-01-01 | Discharge: 2023-01-01 | Disposition: A | Discharge status: Home / Self Care | Payer: Medicare Other | Source: Ambulatory Visit | Attending: Internal Medicine | Admitting: Internal Medicine

## 2023-01-01 ENCOUNTER — Other Ambulatory Visit: Payer: Self-pay

## 2023-01-01 ENCOUNTER — Telehealth: Payer: Self-pay | Admitting: Internal Medicine

## 2023-01-01 ENCOUNTER — Ambulatory Visit
Admission: RE | Admit: 2023-01-01 | Discharge: 2023-01-01 | Disposition: A | Discharge status: Home / Self Care | Payer: Medicare Other | Source: Ambulatory Visit | Attending: Radiation Oncology | Admitting: Radiation Oncology

## 2023-01-01 DIAGNOSIS — C3412 Malignant neoplasm of upper lobe, left bronchus or lung: Secondary | ICD-10-CM | POA: Diagnosis present

## 2023-01-01 DIAGNOSIS — Z510 Encounter for antineoplastic radiation therapy: Secondary | ICD-10-CM | POA: Diagnosis not present

## 2023-01-01 LAB — RAD ONC ARIA SESSION SUMMARY
Course Elapsed Days: 13
Plan Fractions Treated to Date: 10
Plan Prescribed Dose Per Fraction: 2 Gy
Plan Total Fractions Prescribed: 20
Plan Total Prescribed Dose: 40 Gy
Reference Point Dosage Given to Date: 20 Gy
Reference Point Session Dosage Given: 2 Gy
Session Number: 10

## 2023-01-01 MED ORDER — GADOBUTROL 1 MMOL/ML IV SOLN
5.0000 mL | Freq: Once | INTRAVENOUS | Status: AC | PRN
  Administered 2023-01-01: 5 mL via INTRAVENOUS

## 2023-01-01 NOTE — Telephone Encounter (Signed)
Spoke to neurology, Dr.Krikpatrick on call re: pt's recent MRI brain- recommend in pt vs out pt work up. No role for lytic therapy.  Unable to reach pt.  LVM for pt's contact- Doris tickle to call us back.   Haley please reach out to patient-recheck on her symptoms; duration of symptoms.  Recommend baby aspirin once a day.

## 2023-01-01 NOTE — Progress Notes (Signed)
Finally able to reach the patient.  I spoke to patient regarding my concerns on the brain MRI.  Patient declines evaluation/admission to the hospital.  Patient is asymptomatic on the right side.  Recommend full aspirin-325 mg a day.  Also will need carotid and 2D echo.  Patient will keep appointment as planned later this week.

## 2023-01-01 NOTE — Telephone Encounter (Signed)
Received a call from radiology regarding incidental punctate acute stroke noted in the left coronary radiata on brain MRI ordered for screening-lung cancer metastatic diagnosis.  I have reached out to on-call neurology, Dr.Kirpatrick.  For further recommendations.  GB

## 2023-01-02 ENCOUNTER — Ambulatory Visit
Admission: RE | Admit: 2023-01-02 | Discharge: 2023-01-02 | Disposition: A | Discharge status: Home / Self Care | Payer: Medicare Other | Source: Ambulatory Visit | Attending: Radiation Oncology | Admitting: Radiation Oncology

## 2023-01-02 ENCOUNTER — Other Ambulatory Visit: Payer: Self-pay

## 2023-01-02 ENCOUNTER — Other Ambulatory Visit: Payer: Self-pay | Admitting: *Deleted

## 2023-01-02 ENCOUNTER — Telehealth: Payer: Self-pay

## 2023-01-02 DIAGNOSIS — Z510 Encounter for antineoplastic radiation therapy: Secondary | ICD-10-CM | POA: Diagnosis not present

## 2023-01-02 LAB — RAD ONC ARIA SESSION SUMMARY
Course Elapsed Days: 14
Plan Fractions Treated to Date: 11
Plan Prescribed Dose Per Fraction: 2 Gy
Plan Total Fractions Prescribed: 20
Plan Total Prescribed Dose: 40 Gy
Reference Point Dosage Given to Date: 22 Gy
Reference Point Session Dosage Given: 2 Gy
Session Number: 11

## 2023-01-02 NOTE — Telephone Encounter (Signed)
Per note from Dr. Leonard Schwartz, he was able to reach pt to follow up on symptoms and review further recommendations. Nothing further needed at this time. Will follow up with patient at next scheduled visit per Dr. Senaida Lange last note.

## 2023-01-02 NOTE — Telephone Encounter (Signed)
Encounter created in error, please discontinue

## 2023-01-03 ENCOUNTER — Other Ambulatory Visit: Payer: Self-pay

## 2023-01-03 ENCOUNTER — Inpatient Hospital Stay (HOSPITAL_BASED_OUTPATIENT_CLINIC_OR_DEPARTMENT_OTHER): Payer: Medicare Other | Admitting: Hospice and Palliative Medicine

## 2023-01-03 ENCOUNTER — Ambulatory Visit
Admission: RE | Admit: 2023-01-03 | Discharge: 2023-01-03 | Disposition: A | Discharge status: Home / Self Care | Payer: Medicare Other | Source: Ambulatory Visit | Attending: Radiation Oncology | Admitting: Radiation Oncology

## 2023-01-03 DIAGNOSIS — Z510 Encounter for antineoplastic radiation therapy: Secondary | ICD-10-CM | POA: Diagnosis not present

## 2023-01-03 DIAGNOSIS — C3412 Malignant neoplasm of upper lobe, left bronchus or lung: Secondary | ICD-10-CM

## 2023-01-03 LAB — RAD ONC ARIA SESSION SUMMARY
Course Elapsed Days: 15
Plan Fractions Treated to Date: 12
Plan Prescribed Dose Per Fraction: 2 Gy
Plan Total Fractions Prescribed: 20
Plan Total Prescribed Dose: 40 Gy
Reference Point Dosage Given to Date: 24 Gy
Reference Point Session Dosage Given: 2 Gy
Session Number: 12

## 2023-01-03 NOTE — Progress Notes (Signed)
Multidisciplinary Oncology Council Documentation  Crystal Mcmahon was presented by our [facility] on 01/03/2023, which included representatives from:  Palliative Care Dietitian  Physical/Occupational Therapist Nurse Navigator Genetics Speech Therapist Social work Survivorship RN Financial Navigator Research RN   Crystal Mcmahon currently presents with history of lung CA  We reviewed previous medical and familial history, history of present illness, and recent lab results along with all available histopathologic and imaging studies. The MOC considered available treatment options and made the following recommendations/referrals:  SW, nutrition, consider palliative care  The MOC is a meeting of clinicians from various specialty areas who evaluate and discuss patients for whom a multidisciplinary approach is being considered. Final determinations in the plan of care are those of the provider(s).   Today's extended care, comprehensive team conference, Crystal Mcmahon was not present for the discussion and was not examined.

## 2023-01-04 ENCOUNTER — Other Ambulatory Visit: Payer: Self-pay

## 2023-01-04 ENCOUNTER — Ambulatory Visit
Admission: RE | Admit: 2023-01-04 | Discharge: 2023-01-04 | Disposition: A | Discharge status: Home / Self Care | Payer: Medicare Other | Source: Ambulatory Visit | Attending: Radiation Oncology | Admitting: Radiation Oncology

## 2023-01-04 DIAGNOSIS — Z510 Encounter for antineoplastic radiation therapy: Secondary | ICD-10-CM | POA: Diagnosis not present

## 2023-01-04 LAB — RAD ONC ARIA SESSION SUMMARY
Course Elapsed Days: 16
Plan Fractions Treated to Date: 13
Plan Prescribed Dose Per Fraction: 2 Gy
Plan Total Fractions Prescribed: 20
Plan Total Prescribed Dose: 40 Gy
Reference Point Dosage Given to Date: 26 Gy
Reference Point Session Dosage Given: 2 Gy
Session Number: 13

## 2023-01-05 ENCOUNTER — Other Ambulatory Visit: Payer: Self-pay

## 2023-01-05 ENCOUNTER — Encounter: Payer: Self-pay | Admitting: Licensed Clinical Social Worker

## 2023-01-05 ENCOUNTER — Telehealth: Payer: Self-pay | Admitting: *Deleted

## 2023-01-05 ENCOUNTER — Inpatient Hospital Stay: Payer: Medicare Other

## 2023-01-05 ENCOUNTER — Ambulatory Visit
Admission: RE | Admit: 2023-01-05 | Discharge: 2023-01-05 | Disposition: A | Discharge status: Home / Self Care | Payer: Medicare Other | Source: Ambulatory Visit | Attending: Radiation Oncology | Admitting: Radiation Oncology

## 2023-01-05 ENCOUNTER — Encounter: Payer: Self-pay | Admitting: *Deleted

## 2023-01-05 ENCOUNTER — Encounter: Payer: Self-pay | Admitting: Internal Medicine

## 2023-01-05 ENCOUNTER — Inpatient Hospital Stay (HOSPITAL_BASED_OUTPATIENT_CLINIC_OR_DEPARTMENT_OTHER): Payer: Medicare Other | Admitting: Internal Medicine

## 2023-01-05 VITALS — BP 114/57 | HR 57

## 2023-01-05 VITALS — BP 97/50 | HR 65 | Temp 97.3°F | Resp 18

## 2023-01-05 DIAGNOSIS — C3412 Malignant neoplasm of upper lobe, left bronchus or lung: Secondary | ICD-10-CM

## 2023-01-05 DIAGNOSIS — I63522 Cerebral infarction due to unspecified occlusion or stenosis of left anterior cerebral artery: Secondary | ICD-10-CM | POA: Diagnosis not present

## 2023-01-05 DIAGNOSIS — Z510 Encounter for antineoplastic radiation therapy: Secondary | ICD-10-CM | POA: Diagnosis not present

## 2023-01-05 DIAGNOSIS — Z79899 Other long term (current) drug therapy: Secondary | ICD-10-CM | POA: Diagnosis not present

## 2023-01-05 DIAGNOSIS — C7951 Secondary malignant neoplasm of bone: Secondary | ICD-10-CM

## 2023-01-05 LAB — CBC WITH DIFFERENTIAL (CANCER CENTER ONLY)
Abs Immature Granulocytes: 0.17 10*3/uL — ABNORMAL HIGH (ref 0.00–0.07)
Basophils Absolute: 0 10*3/uL (ref 0.0–0.1)
Basophils Relative: 0 %
Eosinophils Absolute: 0.1 10*3/uL (ref 0.0–0.5)
Eosinophils Relative: 1 %
HCT: 39.4 % (ref 36.0–46.0)
Hemoglobin: 13 g/dL (ref 12.0–15.0)
Immature Granulocytes: 2 %
Lymphocytes Relative: 10 %
Lymphs Abs: 0.7 10*3/uL (ref 0.7–4.0)
MCH: 31.7 pg (ref 26.0–34.0)
MCHC: 33 g/dL (ref 30.0–36.0)
MCV: 96.1 fL (ref 80.0–100.0)
Monocytes Absolute: 0.7 10*3/uL (ref 0.1–1.0)
Monocytes Relative: 10 %
Neutro Abs: 5.9 10*3/uL (ref 1.7–7.7)
Neutrophils Relative %: 77 %
Platelet Count: 297 10*3/uL (ref 150–400)
RBC: 4.1 MIL/uL (ref 3.87–5.11)
RDW: 14.4 % (ref 11.5–15.5)
WBC Count: 7.6 10*3/uL (ref 4.0–10.5)
nRBC: 0 % (ref 0.0–0.2)

## 2023-01-05 LAB — CMP (CANCER CENTER ONLY)
ALT: 20 U/L (ref 0–44)
AST: 18 U/L (ref 15–41)
Albumin: 3.2 g/dL — ABNORMAL LOW (ref 3.5–5.0)
Alkaline Phosphatase: 56 U/L (ref 38–126)
Anion gap: 7 (ref 5–15)
BUN: 31 mg/dL — ABNORMAL HIGH (ref 8–23)
CO2: 29 mmol/L (ref 22–32)
Calcium: 8.8 mg/dL — ABNORMAL LOW (ref 8.9–10.3)
Chloride: 99 mmol/L (ref 98–111)
Creatinine: 0.97 mg/dL (ref 0.44–1.00)
GFR, Estimated: 58 mL/min — ABNORMAL LOW (ref >60)
Glucose, Bld: 97 mg/dL (ref 70–99)
Potassium: 3.9 mmol/L (ref 3.5–5.1)
Sodium: 135 mmol/L (ref 135–145)
Total Bilirubin: 0.3 mg/dL (ref 0.3–1.2)
Total Protein: 6.1 g/dL — ABNORMAL LOW (ref 6.5–8.1)

## 2023-01-05 LAB — RAD ONC ARIA SESSION SUMMARY
Course Elapsed Days: 17
Plan Fractions Treated to Date: 14
Plan Prescribed Dose Per Fraction: 2 Gy
Plan Total Fractions Prescribed: 20
Plan Total Prescribed Dose: 40 Gy
Reference Point Dosage Given to Date: 28 Gy
Reference Point Session Dosage Given: 2 Gy
Session Number: 14

## 2023-01-05 MED ORDER — SODIUM CHLORIDE 0.9 % IV SOLN
Freq: Once | INTRAVENOUS | Status: AC
  Filled 2023-01-05: qty 250

## 2023-01-05 MED ORDER — ZOLEDRONIC ACID 4 MG/5ML IV CONC
3.0000 mg | Freq: Once | INTRAVENOUS | Status: AC
  Administered 2023-01-05: 3 mg via INTRAVENOUS
  Filled 2023-01-05: qty 3.75

## 2023-01-05 NOTE — Progress Notes (Signed)
[facility] CONSULT NOTE  Patient Care Team: Crystal Mina, MD as PCP - General (Family Medicine) Glory Buff, RN as Oncology Nurse Navigator  CHIEF COMPLAINTS/PURPOSE OF CONSULTATION: lung cancer  Oncology History Overview Note  MARCH 2024- MRi cervical spine- IMPRESSION: 1. Findings compatible with multifocal osseous metastatic disease, as described above. 2. Abnormal pleuroparenchymal thickening/mass at the left lung apex, concerning for Pancoast tumor or less likely metastasis. This malignancy probably involves the left C7-T1 and T1-T2 foramina and encroaches on more inferior left thoracic foramina, incompletely imaged. Recommend dedicated CT of the chest (preferably with contrast) to further evaluate the chest. Also, dedicated MRI of the thoracic spine with contrast could further characterize thoracic extent if clinically warranted.  SOFT TISSUE NODULE, RIGHT FLANK; ULTRASOUND-GUIDED BIOPSY:  - POORLY DIFFERENTIATED CARCINOMA COMPATIBLE WITH METASTATIC  ADENOCARCINOMA OF LUNG ORIGIN.   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 Accompanied by family, son  Crystal Mcmahon 82 y.o.  female with longstanding  history of smoking-diagnosed lung cancer stage IV left apical lung/multiple bone lesions-is here for follow-up/and results of brain MRI.  Patient currently undergoing radiation-to her neck for the cervical metastatic disease.  Patient is currently on morphine noted to have improvement of the pain.  Continues to have weakness in the left upper extremity.  Denies any falls.  Denies any nausea vomiting.    Denies any dyspnea or cough. Denies weight loss.  Review of Systems  Constitutional:  Negative for chills, diaphoresis, fever, malaise/fatigue and weight loss.  HENT:  Negative for nosebleeds and sore throat.   Eyes:  Negative for double vision.  Respiratory:  Negative for cough, hemoptysis, sputum production, shortness of breath and wheezing.   Cardiovascular:  Negative for chest pain, palpitations, orthopnea and leg swelling.  Gastrointestinal:  Negative for abdominal pain, blood in stool, constipation, diarrhea, heartburn, melena, nausea and vomiting.  Genitourinary:  Negative for dysuria, frequency and urgency.  Musculoskeletal:  Positive for neck pain. Negative for back pain and joint pain.  Skin: Negative.  Negative for itching and rash.  Neurological:  Positive for tingling, sensory change and focal weakness. Negative for dizziness, weakness and headaches.  Endo/Heme/Allergies:  Does not bruise/bleed easily.  Psychiatric/Behavioral:  Negative for depression. The patient is not nervous/anxious and does not have insomnia.     MEDICAL HISTORY:  Past Medical History:  Diagnosis Date   Actinic keratosis    Basal cell carcinoma 06/14/2020   posterior neck, [facility]   Breast cancer 2008   left   Cancer    melanoma   Complication of anesthesia    slow to wake up after surgery   GERD (gastroesophageal reflux disease)  Hypertension    Melanoma 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 Malignant melanoma, lentigo maligna type. 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   Personal history of radiation  therapy    Left breast   Personal history of tobacco use, presenting hazards to health 05/31/2015   Tobacco abuse 01/27/2015   Tobacco abuse 01/27/2015    SURGICAL HISTORY: Past Surgical History:  Procedure Laterality Date   BREAST EXCISIONAL BIOPSY Left 2008   + radation   BREAST EXCISIONAL BIOPSY Right 2001 2010   neg surgical bx's   BREAST LUMPECTOMY Left 2008   w/radiation   CATARACT EXTRACTION W/PHACO Right 04/12/2016   Procedure: Cataract extraction phaco and intraocular lens placement right eye;  Surgeon: Lockie Mola, MD;  Location: [facility];  Service: Ophthalmology;  Laterality: Right;   CATARACT EXTRACTION W/PHACO Left 01/24/2017   Procedure: CATARACT EXTRACTION PHACO AND INTRAOCULAR LENS PLACEMENT (IOC) Complicated left;  Surgeon: Lockie Mola, MD;  Location: [facility];  Service: Ophthalmology;  Laterality: Left;  Complicated Malyugin   CHOLECYSTECTOMY     D&C and hysteroscopy  1996   LEEP  1996   TUBAL LIGATION      SOCIAL HISTORY: Social History   Socioeconomic History   Marital status: Married    Spouse name: Not on file   Number of children: Not on file   Years of education: Not on file   Highest education level: Not on file  Occupational History   Not on file  Tobacco Use   Smoking status: Every Day    Packs/day: 0.25    Years: 32.00    Additional pack years: 0.00    Total pack years: 8.00    Types: Cigarettes   Smokeless tobacco: Never  Substance and Sexual Activity   Alcohol use: No    Alcohol/week: 0.0 standard drinks of alcohol    Comment: occassional   Drug use: No   Sexual activity: Not Currently  Other Topics Concern   Not on file  Social History Narrative   Lives by self; husband in assisted living; Smoker since 24s [college]; rare alcohol. 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 2 children- [place]/ and [place].    Social Determinants of Health   Financial Resource Strain: Not on file  Food  Insecurity: No Food Insecurity (11/29/2022)   Hunger Vital Sign    Worried About Running Out of Food in the Last Year: Never true    Ran Out of Food in the Last Year: Never true  Transportation Needs: No Transportation Needs (11/29/2022)   PRAPARE - Administrator, Civil Service (Medical): No    Lack of Transportation (Non-Medical): No  Physical Activity: Not on file  Stress: Not on file  Social Connections: Not on file  Intimate Partner Violence: Not At Risk (11/29/2022)   Humiliation, Afraid, Rape, and Kick questionnaire    Fear of Current or Ex-Partner: No    Emotionally Abused: No    Physically Abused: No    Sexually Abused: No    FAMILY HISTORY: Family History  Problem Relation Age of Onset   Pancreatic cancer Father    Breast cancer Neg Hx     ALLERGIES:  is allergic to acetaminophen-pamabrom, codeine, and midol pm [diphenhydramine-apap (sleep)].  MEDICATIONS:  Current Outpatient Medications  Medication Sig Dispense Refill   acetaminophen (TYLENOL) 650 MG CR tablet Take 650 mg by mouth every 8 (eight) hours as needed for pain.     amLODipine (NORVASC) 2.5 MG tablet Take 2.5 mg by  mouth daily.      calcium carbonate (OSCAL) 1500 (600 CA) MG TABS tablet Take 600 mg of elemental calcium by mouth 2 (two) times daily with a meal.     citalopram (CELEXA) 10 MG tablet Take 10 mg by mouth daily.     dexamethasone (DECADRON) 4 MG tablet Take 1 pill 3 times a day for 1 week.  And then 1 pill twice a day for 1 week; and then 1 pill a day. 45 tablet 0   gabapentin (NEURONTIN) 300 MG capsule Take 300 mg by mouth in the morning, at noon, and at bedtime.     lansoprazole (PREVACID) 30 MG capsule Take 1 capsule (30 mg total) by mouth daily at 12 noon. 30 capsule 1   meloxicam (MOBIC) 15 MG tablet Take 1 tablet (15 mg total) by mouth daily. 30 tablet 3   morphine (MSIR) 15 MG tablet 1/2 -1 pill every 3-4 hours 30 tablet 0   ondansetron (ZOFRAN) 8 MG tablet One pill every 8 hours  as needed for nausea/vomitting. (Patient not taking: Reported on 12/22/2022) 40 tablet 1   sucralfate (CARAFATE) 1 g tablet Take 1 tablet (1 g total) by mouth 3 (three) times daily. Dissolve tablet in 3to 4 tables spoons warm water swish and swallow 90 tablet 1   triamterene-hydrochlorothiazide (DYAZIDE) 37.5-25 MG capsule Take 1 capsule by mouth daily.   1   No current facility-administered medications for this visit.    PHYSICAL EXAMINATION:   Vitals:   01/05/23 1101  BP: (!) 97/50  Pulse: 65  Resp: 18  Temp: (!) 97.3 F (36.3 C)  SpO2: 97%     There were no vitals filed for this visit.   Patient has weakness of the left upper extremity especially hands.  Physical Exam Vitals and nursing note reviewed.  HENT:     Head: Normocephalic and atraumatic.     Mouth/Throat:     Pharynx: Oropharynx is clear.  Eyes:     Extraocular Movements: Extraocular movements intact.     Pupils: Pupils are equal, round, and reactive to light.  Cardiovascular:     Rate and Rhythm: Normal rate and regular rhythm.  Pulmonary:     Comments: Decreased breath sounds bilaterally.  Abdominal:     Palpations: Abdomen is soft.  Musculoskeletal:        General: Normal range of motion.     Cervical back: Normal range of motion.  Skin:    General: Skin is warm.  Neurological:     Mental Status: She is alert and oriented to person, place, and time.  Psychiatric:        Behavior: Behavior normal.        Judgment: Judgment normal.     LABORATORY DATA:  I have reviewed the data as listed Lab Results  Component Value Date   WBC 7.6 01/05/2023   HGB 13.0 01/05/2023   HCT 39.4 01/05/2023   MCV 96.1 01/05/2023   PLT 297 01/05/2023   Recent Labs    11/29/22 1231 12/22/22 0830 01/05/23 1036  NA 137 133* 135  K 3.2* 3.7 3.9  CL 100 99 99  CO2 28 27 29   GLUCOSE 125* 80 97  BUN 21 26* 31*  CREATININE 0.98 0.83 0.97  CALCIUM 9.0 8.6* 8.8*  GFRNONAA 57* >60 58*  PROT 7.1 6.6 6.1*   ALBUMIN 3.6 3.5 3.2*  AST 24 27 18   ALT 21 34 20  ALKPHOS 68 74 56  BILITOT 0.2*  0.4 0.3    RADIOGRAPHIC STUDIES: I have personally reviewed the radiological images as listed and agreed with the findings in the report. MR Brain W Wo Contrast  Addendum Date: 01/01/2023   ADDENDUM REPORT: 01/01/2023 16:19 ADDENDUM: Critical Value/emergent results were called by telephone at the time of interpretation on 01/01/2023 at 4:10 pm to provider [facility] , who verbally acknowledged these results. Electronically Signed   By: Orvan Falconer M.D.   On: 01/01/2023 16:19   Result Date: 01/01/2023 CLINICAL DATA:  Metastatic disease evaluation. Left arm numbness since January. EXAM: MRI HEAD WITHOUT AND WITH CONTRAST TECHNIQUE: Multiplanar, multiecho pulse sequences of the brain and surrounding structures were obtained without and with intravenous contrast. CONTRAST:  5mL GADAVIST GADOBUTROL 1 MMOL/ML IV SOLN COMPARISON:  None Available. 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 No acute hemorrhage. Chronic microhemorrhage in the left superior frontal gyrus. Moderate chronic small-vessel disease. No hydrocephalus or extra-axial collection. No mass or abnormal enhancement. Vascular: Normal flow voids and enhancement. Skull and upper cervical spine: Normal marrow signal and enhancement. Sinuses/Orbits: Unremarkable. Other: None. IMPRESSION: 1. 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 2. No evidence of intracranial metastatic disease. Radiology assistant personnel have been notified to put me in telephone contact with the referring physician or the referring physician's clinical representative in order to discuss these findings. Once this communication is established I will issue an addendum to this report for documentation purposes. Electronically Signed: By: Orvan Falconer M.D. On: 01/01/2023 16:03   [place] CORE BIOPSY (LYMPH  NODES)  Result Date: 12/15/2022 INDICATION: Cancer staging EXAM: Ultrasound-guided core needle biopsy of right flank subcutaneous soft tissue nodule MEDICATIONS: None. ANESTHESIA/SEDATION: Local analgesia COMPLICATIONS: None immediate. PROCEDURE: Informed written consent was obtained from the patient after a thorough discussion of the procedural risks, benefits and alternatives. All questions were addressed. Maximal Sterile Barrier Technique was utilized including caps, mask, sterile gowns, sterile gloves, sterile drape, hand hygiene and skin antiseptic. A timeout was performed prior to the initiation of the procedure. The patient was placed supine on the exam table. Focused sonographic exam of the left supraclavicular soft tissues was performed. A pathologically enlarged left supraclavicular lymph node was not identified. Attention was then turned to the right flank soft tissues, as there was a PET positive soft tissue nodule on recent PET-CT suspicious for metastatic involvement. The patient was placed prone on the exam table. Ultrasound of the right flank subcutaneous soft tissues demonstrated a predominantly hypoechoic soft tissue nodule in the subcutaneous fat measuring to 1.5 cm, corresponding to recent PET-CT findings. Skin entry site was marked, and the overlying skin was prepped draped in the standard sterile fashion. Local analgesia was obtained with 1% lidocaine. Using ultrasound guidance, core needle biopsy was performed of the right flank subcutaneous soft tissue nodule using an 18 gauge core biopsy device x4 total passes. Specimens were submitted in formalin to pathology for further handling. Limited postprocedure imaging demonstrated no hematoma. A clean dressing was placed after manual hemostasis. The patient tolerated the procedure well without immediate complication. IMPRESSION: Successful ultrasound-guided core needle biopsy of right flank subcutaneous soft tissue nodule corresponding to recent  PET-CT findings. Electronically Signed   By: Olive Bass M.D.   On: 12/15/2022 14:29   MR THORACIC SPINE W WO CONTRAST  Result Date: 12/11/2022 CLINICAL DATA:  History of lung mass, evaluate osseous metastatic disease. EXAM: MRI THORACIC WITHOUT AND WITH CONTRAST TECHNIQUE: Multiplanar  and multiecho pulse sequences of the thoracic spine were obtained without and with intravenous contrast. CONTRAST:  5mL GADAVIST GADOBUTROL 1 MMOL/ML IV SOLN COMPARISON:  PET-CT 12/04/2022, cervical spine MRI 11/21/2022 FINDINGS: Alignment:  Normal. Vertebrae: Vertebral body heights are preserved. Background marrow signal is normal There is T2 hypointensity with enhancement in the C6 through T5 vertebral bodies consistent with osseous metastatic disease. There is involvement of the posterior elements most notably at C7 and T1 on the left and T2 bilaterally. 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 There is tumor in the left C7-T1 and T1-T2 neural foramina which likely affects the exiting C8 and T1 nerve roots. A small amount of tumor may also encroach on the exiting left T2 nerve root (20-14). Surrounding abnormal soft tissue enhancement is contiguous with the primary left apical lung mass. There is osseous involvement in the left first and second ribs. Additional thin epidural tumor measuring 1-2 mm along the left dorsal aspect of the T5 posterior endplate (27-11, 26-19). The above-described epidural tumor does not result in high-grade spinal canal stenosis or cord compression. There is no evidence of pathologic fracture. Cord:  Normal in signal and morphology without abnormal enhancement. Paraspinal and other soft tissues: The left apical soft tissue mass and left paraspinal tumor is described above. The paraspinal soft tissues are otherwise unremarkable. Disc levels: The disc heights are overall preserved. There is no  significant spinal canal or neural foraminal stenosis, aside from tumoral involvement of the left neural foramina in the upper thoracic spine as detailed above. IMPRESSION: 1. 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 No high-grade spinal canal stenosis or cord compression. 2. Tumor in the left C7-T1 and T1-T2 neural foramina likely affects the exiting C8 and T1 nerve roots, contiguous with the left apical primary tumor. A small amount of tumor may also encroach on the exiting left T2 nerve root. 3. Tumor involves the left first and second ribs. 4. No evidence of pathologic fracture. Electronically Signed   By: Lesia Hausen M.D.   On: 12/11/2022 16:47     Primary cancer of left upper lobe of lung # Left upper lobe lung cancer-# MARCH 5th 2024- MRI cervical spine- concerning for metastatic disease to the multiple vertebral bodies/bone; also involvement of the neural foramina; with left apical lung mass. PET March, 19th, 2024-  Hypermetabolic soft tissue mass of the left lung apex with associated hypermetabolic left apical pleural thickening, compatible with primary lung malignancy. Enlarged and hypermetabolic mediastinal and left supraclavicular lymph nodes, compatible with metastatic disease. Hypermetabolic osseous metastatic disease of the lower cervical and upper thoracic spine. Hypermetabolic subcutaneous soft tissue nodule of the right lower back, concerning for soft tissue metastatic disease. Numerous ground-glass nodular opacities are seen in the bilateral upper lobes, largest nodules demonstrate minimal FDG uptake and are increased in size  SOFT TISSUE NODULE, RIGHT FLANK; ULTRASOUND-GUIDED BIOPSY: - POORLY DIFFERENTIATED CARCINOMA COMPATIBLE WITH METASTATIC  ADENOCARCINOMA OF LUNG ORIGIN. F-ONE NGS- PDL-1- 98%.  NEG for novel mutations- see re: RAD; last RT-  4/29-2024.   # Given PD-L1 greater than 90% recommend-single agent Keytruda.  Discussed that we will plan to do interim scanning after 3 cycles also.  Understand treatments are palliative not curative.  I discussed the  mechanism of action.  I also discussed at length of treatments are likely ongoing/based upon the results of the scans. Discussed the potential side effects of immunotherapy including but not limited to diarrhea; skin rash; elevated LFTs/endocrine abnormalities etc. however risk of severe side effects less than 10%.    # Active smoker-5 cigarettes a day.  Will discuss smoking counseling further at next visit.   # Neck/left arm pain-secondary malignancy-bone metastases.  Cervical spine MRI-March 2024 no evidence of any cord compression.  Currently morphine sulfate IR 15 mg nightly well-controlled.  Continue current pain regimen.  Recommend tapering dexamethasone-recommend 2 mg once a day for the next 3 days; and then 1 mg a day-and then stop.  Calcium 8.8; vitamin D April 2024-30; on calcium plus vitamin D.  Zometa 2 mg IV  # MRI Brain-incidental [APRIL 2024]-acute stroke coronary radiata left side-no symptoms recommend asprin 325 mg/day.  Also recommend 2D echo and also carotid Dopplers.  Will make a referral to neurology ASAP.  Patient currently declined hospital admission and inpatient workup.   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  Consider genetic evaluation on the line.  # PIV- continue to IV access; hold off the port.   # zometa- 2mg  4/19.  # DISPOSITION: # chemo education- keytruda # zometa today # referral to [facility]rology[facility] re: acute stroke ASAP # Ultrasound neck; Echo- ordered  # follow up in 2 weeks- MD: labs- cbc/cmp; TSH- Rande Lawman- dr.B  # 40 minutes face-to-face with the patient discussing the above plan of care; more than 50% of time spent on prognosis/ natural history; counseling and coordination.      Earna Coder,  MD 01/05/2023 1:23 PM

## 2023-01-05 NOTE — Assessment & Plan Note (Addendum)
#   Left upper lobe lung cancer-# MARCH 5th 2024- MRI cervical spine- concerning for metastatic disease to the multiple vertebral bodies/bone; also involvement of the neural foramina; with left apical lung mass. PET March, 19th, 2024-  Hypermetabolic soft tissue mass of the left lung apex with associated hypermetabolic left apical pleural thickening, compatible with primary lung malignancy. Enlarged and hypermetabolic mediastinal and left supraclavicular lymph nodes, compatible with metastatic disease. Hypermetabolic osseous metastatic disease of the lower cervical and upper thoracic spine. Hypermetabolic subcutaneous soft tissue nodule of the right lower back, concerning for soft tissue metastatic disease. Numerous ground-glass nodular opacities are seen in the bilateral upper lobes, largest nodules demonstrate minimal FDG uptake and are increased in size  SOFT TISSUE NODULE, RIGHT FLANK; ULTRASOUND-GUIDED BIOPSY: - POORLY DIFFERENTIATED CARCINOMA COMPATIBLE WITH METASTATIC  ADENOCARCINOMA OF LUNG ORIGIN. F-ONE NGS- PDL-1- 98%.  NEG for novel mutations- see re: RAD; last RT- 4/29-2024.   # Given PD-L1 greater than 90% recommend-single agent Keytruda.  Discussed that we will plan to do interim scanning after 3 cycles also.  Understand treatments are palliative not curative.  I discussed the mechanism of action.  I also discussed at length of treatments are likely ongoing/based upon the results of the scans. Discussed the potential side effects of immunotherapy including but not limited to diarrhea; skin rash; elevated LFTs/endocrine abnormalities etc. however risk of severe side effects less than 10%.    # Active smoker-5 cigarettes a day.  Will discuss smoking counseling further at next visit.   # Neck/left arm pain-secondary malignancy-bone metastases.  Cervical spine MRI-March 2024 no evidence of any cord compression.  Currently morphine sulfate IR 15 mg nightly well-controlled.  Continue current pain  regimen.  Recommend tapering dexamethasone-recommend 2 mg once a day for the next 3 days; and then 1 mg a day-and then stop.  Calcium 8.8; vitamin D April 2024-30; on calcium plus vitamin D.  Zometa 2 mg IV  # MRI Brain-incidental [APRIL 2024]-acute stroke coronary radiata left side-no symptoms recommend asprin 325 mg/day.  Also recommend 2D echo and also carotid Dopplers.  Will make a referral to neurology ASAP.  Patient currently declined hospital admission and inpatient workup.   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  Consider genetic evaluation on the line.  # PIV- continue to IV access; hold off the port.   # zometa-  4/19.  # DISPOSITION: # chemo education- keytruda # zometa today # referral to [facility]rology[facility] re: acute stroke ASAP # Ultrasound neck; Echo- ordered  # follow up in 2 weeks- MD: labs- cbc/cmp; TSH- Rande Lawman- dr.B  # 40 minutes face-to-face with the patient discussing the above plan of care; more than 50% of time spent on prognosis/ natural history; counseling and coordination.

## 2023-01-05 NOTE — Progress Notes (Signed)
Per Dr. Donneta Romberg, pt needs to taper off dexamethasone at this time. Phone call made to patient to inform. Pt states that she has 6 tablets left. Instructed to start tomorrow by taking  (1/2 tab) daily x 3 days then will take  (1/4 tab) daily until finished. Pt verbalized understanding. Will call with any questions. Informed pt that will follow up with her at her next clinic visit. Nothing further needed at this time.

## 2023-01-05 NOTE — Addendum Note (Signed)
Addended by: Darrold Span A on: 01/05/2023 01:42 PM   Modules accepted: Orders

## 2023-01-05 NOTE — Telephone Encounter (Signed)
Referral placed in epic for rheumatology at [facility]. Medical records faxed to Dr Geralyn Flash office along with copy of referral.

## 2023-01-05 NOTE — Progress Notes (Signed)
START ON PATHWAY REGIMEN - Non-Small Cell Lung     A cycle is every 21 days:     Pembrolizumab   **Always confirm dose/schedule in your pharmacy ordering system**  Patient Characteristics: Stage IV Metastatic, Nonsquamous, Molecular Analysis Completed, Molecular Alteration Present and Targeted Therapy Exhausted OR KRAS G12C+ or HER2+ Present and No Prior Chemo/Immunotherapy OR No Alteration Present, Initial Chemotherapy/Immunotherapy, PS =  0, 1, No Alteration Present, No Alteration Present, Candidate for Immunotherapy, PD-L1 Expression Positive  ? 50% (TPS) and Immunotherapy Candidate Therapeutic Status: Stage IV Metastatic Histology: Nonsquamous Cell Broad Molecular Profiling Status: Molecular Analysis Completed Molecular Analysis Results: No Alteration Present ECOG Performance Status: 1 Chemotherapy/Immunotherapy Line of Therapy: Initial Chemotherapy/Immunotherapy EGFR Exons 18-21 Mutation Testing Status: Completed and Negative ALK Fusion/Rearrangement Testing Status: Completed and Negative BRAF V600 Mutation Testing Status: Completed and Negative KRAS G12C Mutation Testing Status: Completed and Negative MET Exon 14 Mutation Testing Status: Completed and Negative RET Fusion/Rearrangement Testing Status: Completed and Negative HER2 Mutation Testing Status: Completed and Negative NTRK Fusion/Rearrangement Testing Status: Completed and Negative ROS1 Fusion/Rearrangement Testing Status: Completed and Negative Immunotherapy Candidate Status: Candidate for Immunotherapy PD-L1 Expression Status: PD-L1 Positive ? 50% (TPS) Intent of Therapy: Non-Curative / Palliative Intent, Discussed with Patient 

## 2023-01-05 NOTE — Patient Instructions (Signed)

## 2023-01-05 NOTE — Progress Notes (Signed)
[facility] Clinical Social Work  Clinical Social Work was referred by medical provider for assessment of psychosocial needs.  Clinical Social Worker attempted to contact patient by phone  to offer support and assess for needs.  CSW left voicemail with contact information and request for return call.   FA   Witt Plitt, LCSW  Clinical Social Worker [facility]          

## 2023-01-05 NOTE — Progress Notes (Signed)
Patient has a sore throat from radiation. She does eat and drink enough. Denies lightheadedness or dizziness. Left arm tingles and sometimes hurts pretty bad. Occ productive cough clear phlegm. Denies dyspnea.

## 2023-01-05 NOTE — Progress Notes (Signed)
Lab results reviewed with treatment team. Per MD to proceed with Zometa 3 mg  with Ca 8.8. Pt updated and all questions answered. VSS. Pt stable at discharge.   Crystal Mcmahon [employer]

## 2023-01-08 ENCOUNTER — Ambulatory Visit
Admission: RE | Admit: 2023-01-08 | Discharge: 2023-01-08 | Disposition: A | Discharge status: Home / Self Care | Payer: Medicare Other | Source: Ambulatory Visit | Attending: Radiation Oncology | Admitting: Radiation Oncology

## 2023-01-08 ENCOUNTER — Other Ambulatory Visit: Payer: Self-pay

## 2023-01-08 ENCOUNTER — Inpatient Hospital Stay: Payer: Medicare Other

## 2023-01-08 DIAGNOSIS — Z510 Encounter for antineoplastic radiation therapy: Secondary | ICD-10-CM | POA: Diagnosis not present

## 2023-01-08 LAB — RAD ONC ARIA SESSION SUMMARY
Course Elapsed Days: 20
Plan Fractions Treated to Date: 15
Plan Prescribed Dose Per Fraction: 2 Gy
Plan Total Fractions Prescribed: 20
Plan Total Prescribed Dose: 40 Gy
Reference Point Dosage Given to Date: 30 Gy
Reference Point Session Dosage Given: 2 Gy
Session Number: 15

## 2023-01-09 ENCOUNTER — Encounter: Payer: Self-pay | Admitting: Licensed Clinical Social Worker

## 2023-01-09 ENCOUNTER — Other Ambulatory Visit: Payer: Self-pay

## 2023-01-09 ENCOUNTER — Ambulatory Visit
Admission: RE | Admit: 2023-01-09 | Discharge: 2023-01-09 | Disposition: A | Discharge status: Home / Self Care | Payer: Medicare Other | Source: Ambulatory Visit | Attending: Radiation Oncology | Admitting: Radiation Oncology

## 2023-01-09 DIAGNOSIS — Z510 Encounter for antineoplastic radiation therapy: Secondary | ICD-10-CM | POA: Diagnosis not present

## 2023-01-09 LAB — RAD ONC ARIA SESSION SUMMARY
Course Elapsed Days: 21
Plan Fractions Treated to Date: 16
Plan Prescribed Dose Per Fraction: 2 Gy
Plan Total Fractions Prescribed: 20
Plan Total Prescribed Dose: 40 Gy
Reference Point Dosage Given to Date: 32 Gy
Reference Point Session Dosage Given: 2 Gy
Session Number: 16

## 2023-01-09 NOTE — Progress Notes (Signed)
[facility] Clinical Social Work  Clinical Social Work was referred by medical provider for assessment of psychosocial needs.  Clinical Social Worker attempted to contact patient by phone  to offer support and assess for needs.  CSW left voicemail with contact information and request for return call.   SA    Twala Collings, LCSW  Clinical Social Worker [facility]          

## 2023-01-10 ENCOUNTER — Ambulatory Visit
Admission: RE | Admit: 2023-01-10 | Discharge: 2023-01-10 | Disposition: A | Discharge status: Home / Self Care | Payer: Medicare Other | Source: Ambulatory Visit | Attending: Radiation Oncology | Admitting: Radiation Oncology

## 2023-01-10 ENCOUNTER — Other Ambulatory Visit: Payer: Self-pay

## 2023-01-10 ENCOUNTER — Inpatient Hospital Stay: Payer: Medicare Other

## 2023-01-10 DIAGNOSIS — Z510 Encounter for antineoplastic radiation therapy: Secondary | ICD-10-CM | POA: Diagnosis not present

## 2023-01-10 LAB — RAD ONC ARIA SESSION SUMMARY
Course Elapsed Days: 22
Plan Fractions Treated to Date: 17
Plan Prescribed Dose Per Fraction: 2 Gy
Plan Total Fractions Prescribed: 20
Plan Total Prescribed Dose: 40 Gy
Reference Point Dosage Given to Date: 34 Gy
Reference Point Session Dosage Given: 2 Gy
Session Number: 17

## 2023-01-10 NOTE — Progress Notes (Signed)
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  Past medical history of left breast cancer 2008, lumpectomy, HTN, GERD, melenoma.  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  Planning to start Progress following radiation.   Met with patient and cousin, Tyler Aas.  Patient reports overall good appetite. Does not eat much at one time, but grazes during the day.  Doris helps prepare meals for her.  Usually has coffee and cinnamon roll for breakfast.  Noon today was apple croissant and almost all of ensure max protein.  Planning chicken stew and fruit for dinner.  Likes ice cream and fig newtons. Snacks on goldfish crackers.  Recently had pizza, squash, onion and potato casserole.  Has been avoiding spicey foods and salads due to throat pain.  Drinks tea, water, lemonade, coffee and ensure.   Denies nausea.  Bowels are moving slowly, not constipated.   Medications: carafate, zofran, calcium carbonate   Labs: reviewed   Anthropometrics:   Height: 62 inches Weight: 120 lb on 4/10 but patient thinks this is an error UBW: 123 -124 lb BMI: 22   Estimated Energy Needs  Kcals: 1350-1600 Protein: 68-80 g Fluid: 1350-1600 ml   NUTRITION DIAGNOSIS: Increased nutrient needs related to cancer diagnosis as evidenced by estimated energy needs   INTERVENTION:  Discussed importance of good nutrition and weight maintenance Encouraged high protein foods during meals/snacks. Reviewed foods that contain protein Continue with ensure max protein at this time.  If weight decreases would recommend 350 calorie shake Contact information provided    MONITORING, EVALUATION, GOAL: weight trends, intake   Next Visit: Friday, May 24 during infusion  Eulala Newcombe B. Freida Busman, RD, LDN Registered Dietitian [phone]

## 2023-01-11 ENCOUNTER — Other Ambulatory Visit: Payer: Self-pay

## 2023-01-11 ENCOUNTER — Ambulatory Visit
Admission: RE | Admit: 2023-01-11 | Discharge: 2023-01-11 | Disposition: A | Discharge status: Home / Self Care | Payer: Medicare Other | Source: Ambulatory Visit | Attending: Radiation Oncology | Admitting: Radiation Oncology

## 2023-01-11 DIAGNOSIS — Z510 Encounter for antineoplastic radiation therapy: Secondary | ICD-10-CM | POA: Diagnosis not present

## 2023-01-11 LAB — RAD ONC ARIA SESSION SUMMARY
Course Elapsed Days: 23
Plan Fractions Treated to Date: 18
Plan Prescribed Dose Per Fraction: 2 Gy
Plan Total Fractions Prescribed: 20
Plan Total Prescribed Dose: 40 Gy
Reference Point Dosage Given to Date: 36 Gy
Reference Point Session Dosage Given: 2 Gy
Session Number: 18

## 2023-01-12 ENCOUNTER — Other Ambulatory Visit: Payer: Self-pay

## 2023-01-12 ENCOUNTER — Encounter: Payer: Self-pay | Admitting: Licensed Clinical Social Worker

## 2023-01-12 ENCOUNTER — Inpatient Hospital Stay: Payer: Medicare Other | Admitting: Licensed Clinical Social Worker

## 2023-01-12 ENCOUNTER — Ambulatory Visit
Admission: RE | Admit: 2023-01-12 | Discharge: 2023-01-12 | Disposition: A | Discharge status: Home / Self Care | Payer: Medicare Other | Source: Ambulatory Visit | Attending: Radiation Oncology | Admitting: Radiation Oncology

## 2023-01-12 ENCOUNTER — Inpatient Hospital Stay: Payer: Medicare Other

## 2023-01-12 DIAGNOSIS — Z510 Encounter for antineoplastic radiation therapy: Secondary | ICD-10-CM | POA: Diagnosis not present

## 2023-01-12 DIAGNOSIS — C3412 Malignant neoplasm of upper lobe, left bronchus or lung: Secondary | ICD-10-CM

## 2023-01-12 DIAGNOSIS — C7951 Secondary malignant neoplasm of bone: Secondary | ICD-10-CM

## 2023-01-12 LAB — RAD ONC ARIA SESSION SUMMARY
Course Elapsed Days: 24
Plan Fractions Treated to Date: 19
Plan Prescribed Dose Per Fraction: 2 Gy
Plan Total Fractions Prescribed: 20
Plan Total Prescribed Dose: 40 Gy
Reference Point Dosage Given to Date: 38 Gy
Reference Point Session Dosage Given: 2 Gy
Session Number: 19

## 2023-01-12 NOTE — Progress Notes (Signed)
[facility] Clinical Social Work  Initial Assessment   Crystal Mcmahon is a 82 y.o. year old female contacted by phone. Clinical Social Work was referred by medical provider for assessment of psychosocial needs.   SDOH (Social Determinants of Health) assessments performed: Yes SDOH Interventions    Flowsheet Row Clinical Support from 01/12/2023 in [facility] at [facility] Visit from 11/29/2022 in [facility] at [facility]  SDOH Interventions    Food Insecurity Interventions -- Intervention Not Indicated  Transportation Interventions -- Intervention Not Indicated  Alcohol Usage Interventions Intervention Not Indicated (Score <7) --  Financial Strain Interventions Intervention Not Indicated --  Physical Activity Interventions Intervention Not Indicated --  Stress Interventions Intervention Not Indicated --  Social Connections Interventions Intervention Not Indicated --       SDOH Screenings   Food Insecurity: No Food Insecurity (11/29/2022)  Housing: Low Risk  (11/29/2022)  Transportation Needs: No Transportation Needs (11/29/2022)  Utilities: Not At Risk (11/29/2022)  Alcohol Screen: Low Risk  (01/12/2023)  Depression (PHQ2-9): Low Risk  (11/29/2022)  Financial Resource Strain: Low Risk  (01/12/2023)  Physical Activity: Inactive (01/12/2023)  Social Connections: Moderately Integrated (01/12/2023)  Recent Concern: Social Connections - Moderately Isolated (01/12/2023)  Stress: No Stress Concern Present (01/12/2023)  Tobacco Use: High Risk (12/27/2022)     Distress Screen completed: No    11/29/2022   11:27 AM  ONCBCN DISTRESS SCREENING  Screening Type Initial Screening  Distress experienced in past week (1-10) 0      Family/Social Information:  Housing Arrangement: patient lives with spouse , main contact Faustino Congress [phone]  Family members/support persons in your life? Family, Friends, and Ambulance person concerns: no   Employment: Retired .  Income source: Actor concerns: No Type of concern: None Food access concerns: no Religious or spiritual practice: Not known Services Currently in place:  medicare A&B, [employer] for Life  Coping/ Adjustment to diagnosis: Patient understands treatment plan and what happens next? yes Concerns about diagnosis and/or treatment: I'm not especially worried about anything Patient reported stressors:  No stressors present Hopes and/or priorities: N/A Patient enjoys  N/A Current coping skills/ strengths: Average or above average intelligence , Capable of independent living , Communication skills , Contractor , Motivation for treatment/growth , Physical Health , and Work skills     SUMMARY: Current SDOH Barriers:  No SDOH barriers present  Clinical Social Work Clinical Goal(s):  No clinical social work goals at this time  Interventions: Discussed common feeling and emotions when being diagnosed with cancer, and the importance of support during treatment Informed patient of the support team roles and support services at [facility] Provided CSW contact information and encouraged patient to call with any questions or concerns Provided patient with information about CSW role inpatient care and other available resources.  Patient stated clearly she was not interested or in need of additional resources or assistance.   Follow Up Plan: Patient will contact CSW with any support or resource needs Patient verbalizes understanding of plan: Yes    Makya Phillis, LCSW

## 2023-01-12 NOTE — Progress Notes (Signed)
[facility] Clinical Social Work  Clinical Social Work was referred by medical provider for assessment of psychosocial needs.  Clinical Social Worker attempted to contact patient by phone  to offer support and assess for needs.  CSW left voicemail for patient with contact information and request for return call.    TA   Joseph Art, LCSW  Clinical Social Worker [facility]

## 2023-01-13 ENCOUNTER — Emergency Department: Payer: Medicare Other

## 2023-01-13 ENCOUNTER — Emergency Department
Admission: EM | Admit: 2023-01-13 | Discharge: 2023-01-13 | Disposition: A | Discharge status: Home / Self Care | Payer: Medicare Other | Attending: Emergency Medicine | Admitting: Emergency Medicine

## 2023-01-13 ENCOUNTER — Other Ambulatory Visit: Payer: Self-pay

## 2023-01-13 DIAGNOSIS — Z85828 Personal history of other malignant neoplasm of skin: Secondary | ICD-10-CM | POA: Diagnosis not present

## 2023-01-13 DIAGNOSIS — Z85118 Personal history of other malignant neoplasm of bronchus and lung: Secondary | ICD-10-CM | POA: Insufficient documentation

## 2023-01-13 DIAGNOSIS — Z853 Personal history of malignant neoplasm of breast: Secondary | ICD-10-CM | POA: Diagnosis not present

## 2023-01-13 DIAGNOSIS — D72829 Elevated white blood cell count, unspecified: Secondary | ICD-10-CM | POA: Insufficient documentation

## 2023-01-13 DIAGNOSIS — I959 Hypotension, unspecified: Secondary | ICD-10-CM | POA: Diagnosis not present

## 2023-01-13 DIAGNOSIS — I10 Essential (primary) hypertension: Secondary | ICD-10-CM | POA: Diagnosis not present

## 2023-01-13 DIAGNOSIS — Z79899 Other long term (current) drug therapy: Secondary | ICD-10-CM | POA: Diagnosis not present

## 2023-01-13 LAB — LACTIC ACID, PLASMA
Lactic Acid, Venous: 2 mmol/L (ref 0.5–1.9)
Lactic Acid, Venous: 1.7 mmol/L (ref 0.5–1.9)

## 2023-01-13 LAB — COMPREHENSIVE METABOLIC PANEL
ALT: 61 U/L — ABNORMAL HIGH (ref 0–44)
AST: 62 U/L — ABNORMAL HIGH (ref 15–41)
Albumin: 2.8 g/dL — ABNORMAL LOW (ref 3.5–5.0)
Alkaline Phosphatase: 69 U/L (ref 38–126)
Anion gap: 7 (ref 5–15)
BUN: 61 mg/dL — ABNORMAL HIGH (ref 8–23)
CO2: 29 mmol/L (ref 22–32)
Calcium: 8.2 mg/dL — ABNORMAL LOW (ref 8.9–10.3)
Chloride: 99 mmol/L (ref 98–111)
Creatinine, Ser: 1.44 mg/dL — ABNORMAL HIGH (ref 0.44–1.00)
GFR, Estimated: 36 mL/min — ABNORMAL LOW (ref >60)
Glucose, Bld: 178 mg/dL — ABNORMAL HIGH (ref 70–99)
Potassium: 4.1 mmol/L (ref 3.5–5.1)
Sodium: 135 mmol/L (ref 135–145)
Total Bilirubin: 0.6 mg/dL (ref 0.3–1.2)
Total Protein: 6 g/dL — ABNORMAL LOW (ref 6.5–8.1)

## 2023-01-13 LAB — CBC WITH DIFFERENTIAL/PLATELET
Abs Immature Granulocytes: 0.14 10*3/uL — ABNORMAL HIGH (ref 0.00–0.07)
Basophils Absolute: 0.1 10*3/uL (ref 0.0–0.1)
Basophils Relative: 1 %
Eosinophils Absolute: 0 10*3/uL (ref 0.0–0.5)
Eosinophils Relative: 0 %
HCT: 37.2 % (ref 36.0–46.0)
Hemoglobin: 12.2 g/dL (ref 12.0–15.0)
Immature Granulocytes: 1 %
Lymphocytes Relative: 6 %
Lymphs Abs: 0.6 10*3/uL — ABNORMAL LOW (ref 0.7–4.0)
MCH: 31.4 pg (ref 26.0–34.0)
MCHC: 32.8 g/dL (ref 30.0–36.0)
MCV: 95.9 fL (ref 80.0–100.0)
Monocytes Absolute: 0.3 10*3/uL (ref 0.1–1.0)
Monocytes Relative: 3 %
Neutro Abs: 9.8 10*3/uL — ABNORMAL HIGH (ref 1.7–7.7)
Neutrophils Relative %: 89 %
Platelets: 230 10*3/uL (ref 150–400)
RBC: 3.88 MIL/uL (ref 3.87–5.11)
RDW: 14.6 % (ref 11.5–15.5)
WBC: 10.9 10*3/uL — ABNORMAL HIGH (ref 4.0–10.5)
nRBC: 0 % (ref 0.0–0.2)

## 2023-01-13 LAB — URINALYSIS, ROUTINE W REFLEX MICROSCOPIC
Bilirubin Urine: NEGATIVE
Glucose, UA: NEGATIVE mg/dL
Hgb urine dipstick: NEGATIVE
Ketones, ur: NEGATIVE mg/dL
Leukocytes,Ua: NEGATIVE
Nitrite: NEGATIVE
Protein, ur: NEGATIVE mg/dL
Specific Gravity, Urine: 1.02 (ref 1.005–1.030)
pH: 5 (ref 5.0–8.0)

## 2023-01-13 MED ORDER — SODIUM CHLORIDE 0.9 % IV BOLUS
1000.0000 mL | Freq: Once | INTRAVENOUS | Status: AC
  Administered 2023-01-13: 1000 mL via INTRAVENOUS

## 2023-01-13 MED ORDER — LACTATED RINGERS IV BOLUS
1000.0000 mL | Freq: Once | INTRAVENOUS | Status: AC
  Administered 2023-01-13: 1000 mL via INTRAVENOUS

## 2023-01-13 NOTE — ED Provider Notes (Signed)
[facility] Provider Note    Event Date/Time   First MD Initiated Contact with Patient 01/13/23 1816     (approximate)   History   Hypotension   HPI  Crystal Mcmahon is a 82 y.o. female past medical history of lung cancer with mets to bone who presents because of low blood pressure.  Patient tells me that her friends were concerned she was not eating enough to check her blood pressure was in the 80s and was dropping.  Says she felt fine she denies feeling lightheaded or dizzy.  She is not sure what her blood pressure typically runs but she does take antihypertensives, amlodipine and HCTZ.  She denies infectious symptoms including fevers chills cough or dyspnea.  She denies vomiting diarrhea or abdominal pain denies urinary symptoms.  Says she has been eating and drinking well.  She is undergoing chemotherapy and radiation     Past Medical History:  Diagnosis Date   Actinic keratosis    Basal cell carcinoma 06/14/2020   posterior neck, [facility]   Breast cancer (HCC) 2008   left   Cancer (HCC)    melanoma   Complication of anesthesia    slow to wake up after surgery   GERD (gastroesophageal reflux disease)    Hypertension    Melanoma (HCC) 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 Malignant melanoma, lentigo maligna type. Clark's level II, Breslow's 0.52mm   Personal history of radiation therapy    Left breast   Personal history of tobacco use, presenting hazards to health 05/31/2015   Tobacco abuse 01/27/2015   Tobacco abuse 01/27/2015    Patient Active Problem List   Diagnosis Date Noted   Primary cancer of left upper lobe of lung (HCC) 12/22/2022   Cancer, metastatic to bone (HCC) 11/28/2022   Arthritis 07/06/2015   Malignant neoplasm of breast (HCC) 07/06/2015   BP (high blood pressure) 07/06/2015   Headache, migraine 07/06/2015   Personal history of tobacco use, presenting hazards to health 05/31/2015   Tobacco abuse 01/27/2015     Physical Exam   Triage Vital Signs: ED Triage Vitals [01/13/23 1738]  Enc Vitals Group     BP (!) 84/56     Pulse Rate 90     Resp 20     Temp 98.7 F (37.1 C)     Temp Source Oral     SpO2 93 %     Weight 120 lb (54.4 kg)     Height 5\' 1"  (1.549 m)     Head Circumference      Peak Flow      Pain Score 0     Pain Loc      Pain Edu?      Excl. in GC?     Most recent vital signs: Vitals:   01/13/23 1942 01/13/23 2000  BP: 138/65 (!) 110/55  Pulse: 91 88  Resp: 16 16  Temp:    SpO2: 96%      General: Awake, no distress.  CV:  Good peripheral perfusion.  No edema Resp:  Normal effort.  No increased work of breathing Abd:  Mildly distended abdomen is soft Neuro:             Awake, Alert, Oriented x 3  Other:  Moist mucous membranes no oral lesions   ED Results / Procedures / Treatments  Labs (all labs ordered are listed, but only abnormal results are displayed) Labs Reviewed  LACTIC ACID, PLASMA - Abnormal; Notable  for the following components:      Result Value   Lactic Acid, Venous 2.0 (*)    All other components within normal limits  COMPREHENSIVE METABOLIC PANEL - Abnormal; Notable for the following components:   Glucose, Bld 178 (*)    BUN 61 (*)    Creatinine, Ser 1.44 (*)    Calcium 8.2 (*)    Total Protein 6.0 (*)    Albumin 2.8 (*)    AST 62 (*)    ALT 61 (*)    GFR, Estimated 36 (*)    All other components within normal limits  CBC WITH DIFFERENTIAL/PLATELET - Abnormal; Notable for the following components:   WBC 10.9 (*)    Neutro Abs 9.8 (*)    Lymphs Abs 0.6 (*)    Abs Immature Granulocytes 0.14 (*)    All other components within normal limits  URINALYSIS, ROUTINE W REFLEX MICROSCOPIC - Abnormal; Notable for the following components:   Color, Urine YELLOW (*)    APPearance CLEAR (*)    All other components within normal limits  LACTIC ACID, PLASMA     EKG  EKG reviewed interpreted myself shows a right bundle branch block with left anterior  fascicular block no acute ischemic changes normal sinus rhythm   RADIOLOGY Chest x-ray reviewed interpreted myself shows small bilateral pleural effusion   PROCEDURES:  Critical Care performed: No  Procedures  The patient is on the cardiac monitor to evaluate for evidence of arrhythmia and/or significant heart rate changes.   MEDICATIONS ORDERED IN ED: Medications  sodium chloride 0.9 % bolus 1,000 mL (0 mLs Intravenous Stopped 01/13/23 1856)  lactated ringers bolus 1,000 mL (1,000 mLs Intravenous New Bag/Given 01/13/23 1906)     IMPRESSION / MDM / ASSESSMENT AND PLAN / ED COURSE  I reviewed the triage vital signs and the nursing notes.                              Patient's presentation is most consistent with acute complicated illness / injury requiring diagnostic workup.  Differential diagnosis includes, but is not limited to, hypovolemia from dehydration, AKI, electrolyte abnormality, infection such as UTI, pneumonia, viral illness  Patient is a 82 year old female with metastatic lung cancer who presents because of hypotension.  Her friends checked her blood pressure today and it was in the 80s.  She says she was asymptomatic without lightheadedness dizziness has been eating and drinking well and is not having any other infectious symptoms.  She is not sure what her blood pressure runs typically.  On arrival BP 85/56.  She did receive a bolus of fluid and by the time of my evaluation it has improved with MAP above 65 systolic in the 100s.  Patient overall looks well she is denying GI symptoms upper respiratory symptoms or urinary symptoms.  Abdominal exam is benign no increased work of breathing moist mucous membranes she is mentating well.  Labs are notable for AKI creatinine 1.4 from baseline around 0.8 does have mild leukocytosis lactate elevated at 2.  Will send urinalysis and chest x-ray.  I suspect that patient's hypotension is likely hypovolemic.  Will give another bolus  of fluid as she has no history of heart failure.  If patient is asymptomatic with clearing lactate I do think that she can likely be discharged.  Will recommend she hold her antihypertensives.  Urine is clear.  Chest x-ray shows small bilateral pleural effusions but  no evidence of pneumonia.  Patient's blood pressure did continue to be stable after second bolus.  She continues to be asymptomatic.  I recommend she hold her amlodipine and start checking her blood pressure daily and if consistently less than 100 may need to hold her HCTZ  as well.  Recommend she follow-up with primary care and oncology.       FINAL CLINICAL IMPRESSION(S) / ED DIAGNOSES   Final diagnoses:  Hypotension, unspecified hypotension type     Rx / DC Orders   ED Discharge Orders     None        Note:  This document was prepared using Dragon voice recognition software and may include unintentional dictation errors.   Georga Hacking, MD 01/13/23 2013

## 2023-01-13 NOTE — ED Triage Notes (Signed)
Pt to ED POV with cousin for hypotension. Pt is undergoing radiation for stage 4 lung, bone, lymph node cancer and pt was not hydrating well today. BP taken at home, hightes was 95/56. No SOB, CP. BP in triage is 84/56. Verbal order obtained for IV bolus.

## 2023-01-13 NOTE — Discharge Instructions (Signed)
Stop taking the amlodipine.  Please measure blood pressure daily.  If it is consistently running low less than 100 you may need to stop your other blood pressure medication.  Please follow-up with your oncologist and primary care doctor.  Make sure you are eating and drinking enough.  If you develop any new symptoms that are concerning to you such as fever then return to the emergency department.

## 2023-01-13 NOTE — ED Notes (Signed)
Spoke with EDP Funke, no leg edema, ok to give 1L fluids.

## 2023-01-13 NOTE — ED Notes (Signed)
1 set cultures sent to lab 

## 2023-01-13 NOTE — ED Notes (Signed)
Pt ambulated to toilet with assistance of 2. Pt urine sample sent to lab.

## 2023-01-15 ENCOUNTER — Ambulatory Visit
Admission: RE | Admit: 2023-01-15 | Discharge: 2023-01-15 | Disposition: A | Discharge status: Home / Self Care | Payer: Medicare Other | Source: Ambulatory Visit | Attending: Radiation Oncology | Admitting: Radiation Oncology

## 2023-01-15 ENCOUNTER — Inpatient Hospital Stay: Payer: Medicare Other

## 2023-01-15 ENCOUNTER — Telehealth: Payer: Self-pay | Admitting: Internal Medicine

## 2023-01-15 ENCOUNTER — Other Ambulatory Visit: Payer: Self-pay

## 2023-01-15 DIAGNOSIS — Z510 Encounter for antineoplastic radiation therapy: Secondary | ICD-10-CM | POA: Diagnosis not present

## 2023-01-15 LAB — RAD ONC ARIA SESSION SUMMARY
Course Elapsed Days: 27
Plan Fractions Treated to Date: 20
Plan Prescribed Dose Per Fraction: 2 Gy
Plan Total Fractions Prescribed: 20
Plan Total Prescribed Dose: 40 Gy
Reference Point Dosage Given to Date: 40 Gy
Reference Point Session Dosage Given: 2 Gy
Session Number: 20

## 2023-01-15 NOTE — Telephone Encounter (Signed)
Patient can not attend Chemo education in the morning. She has no one to bring her unless it is the afternoon. Appointment has been cancelled. I let her know someone would call her if needed.

## 2023-01-16 ENCOUNTER — Telehealth: Payer: Self-pay | Admitting: *Deleted

## 2023-01-16 ENCOUNTER — Other Ambulatory Visit: Payer: Self-pay | Admitting: *Deleted

## 2023-01-16 ENCOUNTER — Other Ambulatory Visit: Payer: Medicare Other

## 2023-01-16 NOTE — Telephone Encounter (Signed)
Telephone call received from patient that she was unable to get to her chemo education class due to lack of Transportation. Pt is ambulatory. Pt feels uncertain about getting to all future appts as needed.

## 2023-01-18 ENCOUNTER — Inpatient Hospital Stay: Payer: Medicare Other | Attending: Internal Medicine

## 2023-01-18 ENCOUNTER — Inpatient Hospital Stay: Payer: Medicare Other

## 2023-01-18 DIAGNOSIS — Z5112 Encounter for antineoplastic immunotherapy: Secondary | ICD-10-CM | POA: Insufficient documentation

## 2023-01-18 DIAGNOSIS — C3412 Malignant neoplasm of upper lobe, left bronchus or lung: Secondary | ICD-10-CM | POA: Insufficient documentation

## 2023-01-18 DIAGNOSIS — C7951 Secondary malignant neoplasm of bone: Secondary | ICD-10-CM | POA: Insufficient documentation

## 2023-01-18 DIAGNOSIS — F17210 Nicotine dependence, cigarettes, uncomplicated: Secondary | ICD-10-CM | POA: Insufficient documentation

## 2023-01-18 DIAGNOSIS — Z79620 Long term (current) use of immunosuppressive biologic: Secondary | ICD-10-CM | POA: Insufficient documentation

## 2023-01-19 ENCOUNTER — Ambulatory Visit: Payer: Medicare Other | Admitting: Internal Medicine

## 2023-01-19 ENCOUNTER — Inpatient Hospital Stay (HOSPITAL_BASED_OUTPATIENT_CLINIC_OR_DEPARTMENT_OTHER): Payer: Medicare Other | Admitting: Internal Medicine

## 2023-01-19 ENCOUNTER — Inpatient Hospital Stay: Payer: Medicare Other

## 2023-01-19 ENCOUNTER — Ambulatory Visit: Payer: Medicare Other

## 2023-01-19 ENCOUNTER — Other Ambulatory Visit: Payer: Self-pay

## 2023-01-19 ENCOUNTER — Encounter: Payer: Self-pay | Admitting: Internal Medicine

## 2023-01-19 ENCOUNTER — Other Ambulatory Visit: Payer: Medicare Other

## 2023-01-19 VITALS — BP 87/51 | HR 71 | Temp 98.0°F | Ht 61.0 in | Wt 125.0 lb

## 2023-01-19 DIAGNOSIS — R5383 Other fatigue: Secondary | ICD-10-CM

## 2023-01-19 DIAGNOSIS — C3412 Malignant neoplasm of upper lobe, left bronchus or lung: Secondary | ICD-10-CM | POA: Diagnosis present

## 2023-01-19 DIAGNOSIS — Z79620 Long term (current) use of immunosuppressive biologic: Secondary | ICD-10-CM | POA: Diagnosis not present

## 2023-01-19 DIAGNOSIS — Z5112 Encounter for antineoplastic immunotherapy: Secondary | ICD-10-CM | POA: Diagnosis present

## 2023-01-19 DIAGNOSIS — F17210 Nicotine dependence, cigarettes, uncomplicated: Secondary | ICD-10-CM | POA: Diagnosis not present

## 2023-01-19 DIAGNOSIS — C7951 Secondary malignant neoplasm of bone: Secondary | ICD-10-CM | POA: Diagnosis not present

## 2023-01-19 LAB — CBC WITH DIFFERENTIAL (CANCER CENTER ONLY)
Abs Immature Granulocytes: 0.22 10*3/uL — ABNORMAL HIGH (ref 0.00–0.07)
Basophils Absolute: 0.1 10*3/uL (ref 0.0–0.1)
Basophils Relative: 1 %
Eosinophils Absolute: 0.1 10*3/uL (ref 0.0–0.5)
Eosinophils Relative: 1 %
HCT: 30.3 % — ABNORMAL LOW (ref 36.0–46.0)
Hemoglobin: 9.7 g/dL — ABNORMAL LOW (ref 12.0–15.0)
Immature Granulocytes: 3 %
Lymphocytes Relative: 10 %
Lymphs Abs: 0.8 10*3/uL (ref 0.7–4.0)
MCH: 30.7 pg (ref 26.0–34.0)
MCHC: 32 g/dL (ref 30.0–36.0)
MCV: 95.9 fL (ref 80.0–100.0)
Monocytes Absolute: 0.4 10*3/uL (ref 0.1–1.0)
Monocytes Relative: 5 %
Neutro Abs: 6.3 10*3/uL (ref 1.7–7.7)
Neutrophils Relative %: 80 %
Platelet Count: 293 10*3/uL (ref 150–400)
RBC: 3.16 MIL/uL — ABNORMAL LOW (ref 3.87–5.11)
RDW: 14.6 % (ref 11.5–15.5)
Smear Review: NORMAL
WBC Count: 7.7 10*3/uL (ref 4.0–10.5)
nRBC: 0.5 % — ABNORMAL HIGH (ref 0.0–0.2)

## 2023-01-19 LAB — CMP (CANCER CENTER ONLY)
ALT: 27 U/L (ref 0–44)
AST: 38 U/L (ref 15–41)
Albumin: 2.2 g/dL — ABNORMAL LOW (ref 3.5–5.0)
Alkaline Phosphatase: 54 U/L (ref 38–126)
Anion gap: 11 (ref 5–15)
BUN: 34 mg/dL — ABNORMAL HIGH (ref 8–23)
CO2: 28 mmol/L (ref 22–32)
Calcium: 8.7 mg/dL — ABNORMAL LOW (ref 8.9–10.3)
Chloride: 97 mmol/L — ABNORMAL LOW (ref 98–111)
Creatinine: 1.14 mg/dL — ABNORMAL HIGH (ref 0.44–1.00)
GFR, Estimated: 48 mL/min — ABNORMAL LOW (ref >60)
Glucose, Bld: 105 mg/dL — ABNORMAL HIGH (ref 70–99)
Potassium: 4.3 mmol/L (ref 3.5–5.1)
Sodium: 136 mmol/L (ref 135–145)
Total Bilirubin: 0.4 mg/dL (ref 0.3–1.2)
Total Protein: 5.8 g/dL — ABNORMAL LOW (ref 6.5–8.1)

## 2023-01-19 LAB — TSH: TSH: 2.106 u[IU]/mL (ref 0.350–4.500)

## 2023-01-19 LAB — FERRITIN: Ferritin: 174 ng/mL (ref 11–307)

## 2023-01-19 LAB — IRON AND TIBC
Iron: 18 ug/dL — ABNORMAL LOW (ref 28–170)
Saturation Ratios: 8 % — ABNORMAL LOW (ref 10.4–31.8)
TIBC: 228 ug/dL — ABNORMAL LOW (ref 250–450)
UIBC: 210 ug/dL

## 2023-01-19 MED ORDER — MORPHINE SULFATE 15 MG PO TABS: ORAL_TABLET | ORAL | 0 refills | Status: DC

## 2023-01-19 NOTE — Assessment & Plan Note (Addendum)
#   Left upper lobe lung cancer-# MARCH 5th 2024- MRI cervical spine- concerning for metastatic disease to the multiple vertebral bodies/bone; also involvement of the neural foramina; with left apical lung mass. PET March, 19th, 2024-  Hypermetabolic soft tissue mass of the left lung apex with associated hypermetabolic left apical pleural thickening, compatible with primary lung malignancy. Enlarged and hypermetabolic mediastinal and left supraclavicular lymph nodes, compatible with metastatic disease. Hypermetabolic osseous metastatic disease of the lower cervical and upper thoracic spine. Hypermetabolic subcutaneous soft tissue nodule of the right lower back, concerning for soft tissue metastatic disease. Numerous ground-glass nodular opacities are seen in the bilateral upper lobes, largest nodules demonstrate minimal FDG uptake and are increased in size  SOFT TISSUE NODULE, RIGHT FLANK; ULTRASOUND-GUIDED BIOPSY: - POORLY DIFFERENTIATED CARCINOMA COMPATIBLE WITH METASTATIC  ADENOCARCINOMA OF LUNG ORIGIN. F-ONE NGS- PDL-1- 98%.  NEG for novel mutations- see re: RAD; last RT- 4/29-2024.   # Given PD-L1 greater than 90% recommend-single agent Keytruda. Proceed with Rande Lawman #1 next week [pt late for the appt. ]  # Mild anemia hemoglobin around 9-10.  Unclear etiology.  No blood in stools or black or stools.  Recommend iron studies ferritin.  Monitor closely.  # Hypotension- ? Etiology off BP medication; s/p dex taper- last today. Poor intake from RT esophagitis- improving- continue to hold off BP meds. Consider Fluids at next visit.    # Active smoker-5 cigarettes a day.  Will discuss smoking counseling further at next visit.   # Neck/left arm pain-secondary malignancy-bone metastases.  Cervical spine MRI-March 2024 no evidence of any cord compression.  Currently morphine sulfate IR 15 mg nightly well-controlled.  Continue current pain regimen.  vitamin D April 2024-30; on calcium plus vitamin D.  Zometa 2 mg  IV- stable.   # MRI Brain-incidental [APRIL 2024]-acute stroke coronary radiata left side-ns/p eval Dr.Shah  on asprin 325 mg/day; Statin- stable.   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  Consider genetic evaluation on the line.  # PIV- continue to IV access; hold off the port.   # zometa- 2mg  4/19.  # DISPOSITION: # 5/06- no labs-APP;  Rande Lawman; possible IVFs 1 lit over 1 hour As per IS- # follow up in 3 weeks- MD: labs- cbc/cmp; TSH- Rande Lawman; possible iVFs/1 hour-Dr.B

## 2023-01-19 NOTE — Progress Notes (Signed)
Was seen at [facility] ED for low BP, stopped bp meds.  Had some vomiting and diarrhea on Monday.

## 2023-01-19 NOTE — Progress Notes (Signed)
[facility] CONSULT NOTE  Patient Care Team: [name], MD as PCP - General (Family Medicine) Glory Buff, RN as Oncology Nurse Navigator  CHIEF COMPLAINTS/PURPOSE OF CONSULTATION: lung cancer  Oncology History Overview Note  MARCH 2024- MRi cervical spine- IMPRESSION: 1. Findings compatible with multifocal osseous metastatic disease, as described above. 2. Abnormal pleuroparenchymal thickening/mass at the left lung apex, concerning for Pancoast tumor or less likely metastasis. This malignancy probably involves the left C7-T1 and T1-T2 foramina and encroaches on more inferior left thoracic foramina, incompletely imaged. Recommend dedicated CT of the chest (preferably with contrast) to further evaluate the chest. Also, dedicated MRI of the thoracic spine with contrast could further characterize thoracic extent if clinically warranted.  SOFT TISSUE NODULE, RIGHT FLANK; ULTRASOUND-GUIDED BIOPSY:  - POORLY DIFFERENTIATED CARCINOMA COMPATIBLE WITH METASTATIC  ADENOCARCINOMA OF LUNG ORIGIN.   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 Accompanied by family, son  Shaparis Herbold 82 y.o.  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  Status post recent evaluation with neurology regarding the stroke.  Patient was recently evaluated in the emergency room for low blood pressure.  Patient was taken off her blood pressure medication.   Had some vomiting and diarrhea on Monday.  Currently resolved.  Patient noted to have difficulty swallowing pain with swallowing over the last week or so.  Currently improving/resolved.  Patient s/p radiation to her neck for the cervical metastatic disease.  Patient is currently on morphine noted to have improvement of the pain.  Continues to have weakness in the left upper extremity.  Denies any falls.  Denies any nausea vomiting.  Denies any blood in stools or black or stools.  Denies any dyspnea or cough. Denies weight loss.  Review of Systems  Constitutional:  Negative for chills, diaphoresis, fever, malaise/fatigue and weight loss.  HENT:  Negative for nosebleeds and sore throat.   Eyes:  Negative for double vision.  Respiratory:  Negative for cough, hemoptysis, sputum production, shortness of breath and wheezing.   Cardiovascular:  Negative for chest pain, palpitations, orthopnea and leg swelling.  Gastrointestinal:  Negative for abdominal pain, blood in stool, constipation, diarrhea, heartburn, melena, nausea and vomiting.  Genitourinary:  Negative for dysuria, frequency and urgency.  Musculoskeletal:  Positive for neck pain. Negative for back pain and joint pain.  Skin: Negative.  Negative for itching and rash.  Neurological:  Positive for tingling, sensory change and focal weakness. Negative for dizziness, weakness and headaches.  Endo/Heme/Allergies:  Does not  bruise/bleed easily.  Psychiatric/Behavioral:  Negative for depression. The patient is not nervous/anxious and does  not have insomnia.     MEDICAL HISTORY:  Past Medical History:  Diagnosis Date   Actinic keratosis    Basal cell carcinoma 06/14/2020   posterior neck, [facility]   Breast cancer (HCC) 2008   left   Cancer (HCC)    melanoma   Complication of anesthesia    slow to wake up after surgery   GERD (gastroesophageal reflux disease)    Hypertension    Melanoma (HCC) 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 Malignant melanoma, lentigo maligna type. Clark's level II, Breslow's 0.4mm   Personal history of radiation therapy    Left breast   Personal history of tobacco use, presenting hazards to health 05/31/2015   Tobacco abuse 01/27/2015   Tobacco abuse 01/27/2015    SURGICAL HISTORY: Past Surgical History:  Procedure Laterality Date   BREAST EXCISIONAL BIOPSY Left 2008   + radation   BREAST EXCISIONAL BIOPSY Right 2001 2010   neg surgical bx's   BREAST LUMPECTOMY Left 2008   w/radiation   CATARACT EXTRACTION W/PHACO Right 04/12/2016   Procedure: Cataract extraction phaco and intraocular lens placement right eye;  Surgeon: Lockie Mola, MD;  Location: [facility];  Service: Ophthalmology;  Laterality: Right;   CATARACT EXTRACTION W/PHACO Left 01/24/2017   Procedure: CATARACT EXTRACTION PHACO AND INTRAOCULAR LENS PLACEMENT (IOC) Complicated left;  Surgeon: Lockie Mola, MD;  Location: [facility];  Service: Ophthalmology;  Laterality: Left;  Complicated Malyugin   CHOLECYSTECTOMY     D&C and hysteroscopy  1996   LEEP  1996   TUBAL LIGATION      SOCIAL HISTORY: Social History   Socioeconomic History   Marital status: Married    Spouse name: Not on file   Number of children: Not on file   Years of education: Not on file   Highest education level: Not on file  Occupational History   Not on file  Tobacco Use   Smoking status: Every Day    Packs/day: 0.25    Years: 32.00    Additional pack years: 0.00    Total pack years: 8.00    Types: Cigarettes    Smokeless tobacco: Never  Substance and Sexual Activity   Alcohol use: No    Alcohol/week: 0.0 standard drinks of alcohol    Comment: occassional   Drug use: No   Sexual activity: Not Currently  Other Topics Concern   Not on file  Social History Narrative   Lives by self; husband in assisted living; Smoker since 2s [college]; rare alcohol. 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 2 children- [place]/ and [place].    Social Determinants of Health   Financial Resource Strain: Low Risk  (01/12/2023)   Overall Financial Resource Strain (CARDIA)    Difficulty of Paying Living Expenses: Not hard at all  Food Insecurity: No Food Insecurity (11/29/2022)   Hunger Vital Sign    Worried About Running Out of Food in the Last Year: Never true    Ran Out of Food in the Last Year: Never true  Transportation Needs: Unmet Transportation Needs (01/16/2023)   PRAPARE - Administrator, Civil Service (Medical): Yes    Lack of Transportation (Non-Medical): Yes  Physical Activity: Inactive (01/12/2023)   Exercise Vital Sign    Days of Exercise per Week: 0 days    Minutes of Exercise per Session: 0 min  Stress: No Stress Concern Present (01/12/2023)  [employer] of Occupational Health - Occupational Stress Questionnaire    Feeling of Stress : Only a little  Social Connections: Moderately Integrated (01/12/2023)   Social Connection and Isolation Panel [NHANES]    Frequency of Communication with Friends and Family: Twice a week    Frequency of Social Gatherings with Friends and Family: Once a week    Attends Religious Services: 1 to 4 times per year    Active Member of [employer] or Organizations: No    Attends Banker Meetings: Never    Marital Status: Married  Recent Concern: Social Connections - Moderately Isolated (01/12/2023)   Social Connection and Isolation Panel [NHANES]    Frequency of Communication with Friends and Family: Once a week    Frequency of Social Gatherings with  Friends and Family: Once a week    Attends Religious Services: 1 to 4 times per year    Active Member of [employer] or Organizations: No    Attends Banker Meetings: Never    Marital Status: Married  Catering manager Violence: Not At Risk (11/29/2022)   Humiliation, Afraid, Rape, and Kick questionnaire    Fear of Current or Ex-Partner: No    Emotionally Abused: No    Physically Abused: No    Sexually Abused: No    FAMILY HISTORY: Family History  Problem Relation Age of Onset   Pancreatic cancer Father    Breast cancer Neg Hx     ALLERGIES:  is allergic to acetaminophen-pamabrom, codeine, and midol pm [diphenhydramine-apap (sleep)].  MEDICATIONS:  Current Outpatient Medications  Medication Sig Dispense Refill   acetaminophen (TYLENOL) 650 MG CR tablet Take 650 mg by mouth every 8 (eight) hours as needed for pain.     atorvastatin (LIPITOR) 20 MG tablet Take 20 mg by mouth daily.     calcium carbonate (OSCAL) 1500 (600 CA) MG TABS tablet Take 600 mg of elemental calcium by mouth 2 (two) times daily with a meal.     citalopram (CELEXA) 10 MG tablet Take 10 mg by mouth daily.     lansoprazole (PREVACID) 30 MG capsule Take 1 capsule (30 mg total) by mouth daily at 12 noon. 30 capsule 1   meloxicam (MOBIC) 15 MG tablet Take 1 tablet (15 mg total) by mouth daily. 30 tablet 3   ondansetron (ZOFRAN) 8 MG tablet One pill every 8 hours as needed for nausea/vomitting. 40 tablet 1   sucralfate (CARAFATE) 1 g tablet Take 1 tablet (1 g total) by mouth 3 (three) times daily. Dissolve tablet in 3to 4 tables spoons warm water swish and swallow 90 tablet 1   amLODipine (NORVASC) 2.5 MG tablet Take 2.5 mg by mouth daily.  (Patient not taking: Reported on 01/19/2023)     gabapentin (NEURONTIN) 300 MG capsule Take 300 mg by mouth in the morning, at noon, and at bedtime.     morphine (MSIR) 15 MG tablet 1/2 -1 pill every 8- 12 hours as needed. 45 tablet 0   triamterene-hydrochlorothiazide (DYAZIDE)  37.5-25 MG capsule Take 1 capsule by mouth daily.  (Patient not taking: Reported on 01/19/2023)  1   No current facility-administered medications for this visit.    PHYSICAL EXAMINATION:   Vitals:   01/19/23 1458  BP: (!) 87/51  Pulse: 71  Temp: 98 F (36.7 C)  SpO2: 100%      Filed Weights   01/19/23 1458  Weight: 125 lb (56.7 kg)     Patient has weakness of the left upper  extremity especially hands.  Physical Exam Vitals and nursing note reviewed.  HENT:     Head: Normocephalic and atraumatic.     Mouth/Throat:     Pharynx: Oropharynx is clear.  Eyes:     Extraocular Movements: Extraocular movements intact.     Pupils: Pupils are equal, round, and reactive to light.  Cardiovascular:     Rate and Rhythm: Normal rate and regular rhythm.  Pulmonary:     Comments: Decreased breath sounds bilaterally.  Abdominal:     Palpations: Abdomen is soft.  Musculoskeletal:        General: Normal range of motion.     Cervical back: Normal range of motion.  Skin:    General: Skin is warm.  Neurological:     Mental Status: She is alert and oriented to person, place, and time.  Psychiatric:        Behavior: Behavior normal.        Judgment: Judgment normal.     LABORATORY DATA:  I have reviewed the data as listed Lab Results  Component Value Date   WBC 7.7 01/19/2023   HGB 9.7 (L) 01/19/2023   HCT 30.3 (L) 01/19/2023   MCV 95.9 01/19/2023   PLT 293 01/19/2023   Recent Labs    01/05/23 1036 01/13/23 1742 01/19/23 1502  NA 135 135 136  K 3.9 4.1 4.3  CL 99 99 97*  CO2 29 29 28   GLUCOSE 97 178* 105*  BUN 31* 61* 34*  CREATININE 0.97 1.44* 1.14*  CALCIUM 8.8* 8.2* 8.7*  GFRNONAA 58* 36* 48*  PROT 6.1* 6.0* 5.8*  ALBUMIN 3.2* 2.8* 2.2*  AST 18 62* 38  ALT 20 61* 27  ALKPHOS 56 69 54  BILITOT 0.3 0.6 0.4    RADIOGRAPHIC STUDIES: I have personally reviewed the radiological images as listed and agreed with the findings in the report. DG Chest 2  View  Result Date: 01/13/2023 CLINICAL DATA:  Hypotensive, immunocompromise. 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 There are likely small bilateral pleural effusions. There is no focal lung infiltrate. The cardiomediastinal silhouette is within normal limits. Bones are osteopenic. No acute fractures are seen. There surgical clips in the right abdomen. IMPRESSION: 1. Small bilateral pleural effusions. 2. 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 Electronically Signed   By: Darliss Cheney M.D.   On: 01/13/2023 18:29   MR Brain W Wo Contrast  Addendum Date: 01/01/2023   ADDENDUM REPORT: 01/01/2023 16:19 ADDENDUM: Critical Value/emergent results were called by telephone at the time of interpretation on 01/01/2023 at 4:10 pm to provider [facility] , who verbally acknowledged these results. Electronically Signed   By: Orvan Falconer M.D.   On: 01/01/2023 16:19   Result Date: 01/01/2023 CLINICAL DATA:  Metastatic disease evaluation. Left arm numbness since January. EXAM: MRI HEAD WITHOUT AND WITH CONTRAST TECHNIQUE: Multiplanar, multiecho pulse sequences of the brain and surrounding structures were obtained without and with intravenous contrast. CONTRAST:  5mL GADAVIST GADOBUTROL 1 MMOL/ML IV SOLN COMPARISON:  None Available. 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 No acute hemorrhage. Chronic microhemorrhage in the left superior frontal gyrus. Moderate chronic small-vessel disease. No hydrocephalus or extra-axial collection. No mass or abnormal enhancement. Vascular: Normal flow voids and enhancement. Skull and upper cervical spine: Normal marrow signal and enhancement. Sinuses/Orbits: Unremarkable. Other: None. IMPRESSION: 1. Small focus  of acute infarction in the left corona radiata. 2. No evidence of intracranial metastatic disease.  Radiology assistant personnel have been notified to put me in telephone contact with the referring physician or the referring physician's clinical representative in order to discuss these findings. Once this communication is established I will issue an addendum to this report for documentation purposes. Electronically Signed: By: Orvan Falconer M.D. On: 01/01/2023 16:03     Primary cancer of left upper lobe of lung (HCC) # Left upper lobe lung cancer-# MARCH 5th 2024- MRI cervical spine- concerning for metastatic disease to the multiple vertebral bodies/bone; also involvement of the neural foramina; with left apical lung mass. PET March, 19th, 2024-  Hypermetabolic soft tissue mass of the left lung apex with associated hypermetabolic left apical pleural thickening, compatible with primary lung malignancy. Enlarged and hypermetabolic mediastinal and left supraclavicular lymph nodes, compatible with metastatic disease. Hypermetabolic osseous metastatic disease of the lower cervical and upper thoracic spine. Hypermetabolic subcutaneous soft tissue nodule of the right lower back, concerning for soft tissue metastatic disease. Numerous ground-glass nodular opacities are seen in the bilateral upper lobes, largest nodules demonstrate minimal FDG uptake and are increased in size  SOFT TISSUE NODULE, RIGHT FLANK; ULTRASOUND-GUIDED BIOPSY: - POORLY DIFFERENTIATED CARCINOMA COMPATIBLE WITH METASTATIC  ADENOCARCINOMA OF LUNG ORIGIN. F-ONE NGS- PDL-1- 98%.  NEG for novel mutations- see re: RAD; last RT- 4/29-2024.   # Given PD-L1 greater than 90% recommend-single agent Keytruda. Proceed with Rande Lawman #1 next week [pt late for the appt. ]  # Mild anemia hemoglobin around 9-10.  Unclear etiology.  No blood in stools or black or stools.  Recommend iron studies ferritin.  Monitor closely.  # Hypotension- ? Etiology off BP medication; s/p dex taper- last today. Poor intake from RT esophagitis- improving- continue to hold  off BP meds. Consider Fluids at next visit.    # Active smoker-5 cigarettes a day.  Will discuss smoking counseling further at next visit.   # Neck/left arm pain-secondary malignancy-bone metastases.  Cervical spine MRI-March 2024 no evidence of any cord compression.  Currently morphine sulfate IR 15 mg nightly well-controlled.  Continue current pain regimen.  vitamin D April 2024-30; on calcium plus vitamin D.  Zometa 2 mg IV- stable.   # MRI Brain-incidental [APRIL 2024]-acute stroke coronary radiata left side-ns/p eval Dr.Shah  on asprin 325 mg/day; Statin- stable.   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  Consider genetic evaluation on the line.  # PIV- continue to IV access; hold off the port.   # zometa- 2mg  4/19.  # DISPOSITION: # 5/06- no labs-APP;  Rande Lawman; possible IVFs 1 lit over 1 hour As per IS- # follow up in 3 weeks- MD: labs- cbc/cmp; TSH- Rande Lawman; possible iVFs/1 hour-Dr.B       Earna Coder, MD 01/19/2023 4:19 PM

## 2023-01-20 LAB — T4: T4, Total: 4.5 ug/dL (ref 4.5–12.0)

## 2023-01-22 ENCOUNTER — Inpatient Hospital Stay: Payer: Medicare Other

## 2023-01-22 ENCOUNTER — Ambulatory Visit: Payer: Medicare Other

## 2023-01-22 ENCOUNTER — Inpatient Hospital Stay (HOSPITAL_BASED_OUTPATIENT_CLINIC_OR_DEPARTMENT_OTHER): Payer: Medicare Other | Admitting: Nurse Practitioner

## 2023-01-22 ENCOUNTER — Encounter: Payer: Self-pay | Admitting: Nurse Practitioner

## 2023-01-22 VITALS — BP 99/42 | HR 79 | Resp 16

## 2023-01-22 VITALS — BP 94/75 | HR 95 | Temp 98.6°F | Resp 20 | Wt 124.0 lb

## 2023-01-22 DIAGNOSIS — C7951 Secondary malignant neoplasm of bone: Secondary | ICD-10-CM | POA: Diagnosis not present

## 2023-01-22 DIAGNOSIS — Z5112 Encounter for antineoplastic immunotherapy: Secondary | ICD-10-CM

## 2023-01-22 DIAGNOSIS — C3412 Malignant neoplasm of upper lobe, left bronchus or lung: Secondary | ICD-10-CM

## 2023-01-22 DIAGNOSIS — R0902 Hypoxemia: Secondary | ICD-10-CM

## 2023-01-22 MED ORDER — SODIUM CHLORIDE 0.9 % IV SOLN
Freq: Once | INTRAVENOUS | Status: AC
  Filled 2023-01-22: qty 250

## 2023-01-22 MED ORDER — SODIUM CHLORIDE 0.9 % IV SOLN
200.0000 mg | Freq: Once | INTRAVENOUS | Status: AC
  Administered 2023-01-22: 200 mg via INTRAVENOUS
  Filled 2023-01-22: qty 8

## 2023-01-22 NOTE — Progress Notes (Unsigned)
[facility] CONSULT NOTE  Patient Care Team: [name], MD as PCP - General (Family Medicine) Glory Buff, RN as Oncology Nurse Navigator  CHIEF COMPLAINTS/PURPOSE OF CONSULTATION: lung cancer  Oncology History Overview Note  MARCH 2024- MRi cervical spine- IMPRESSION: 1. Findings compatible with multifocal osseous metastatic disease, as described above. 2. Abnormal pleuroparenchymal thickening/mass at the left lung apex, concerning for Pancoast tumor or less likely metastasis. This malignancy probably involves the left C7-T1 and T1-T2 foramina and encroaches on more inferior left thoracic foramina, incompletely imaged. Recommend dedicated CT of the chest (preferably with contrast) to further evaluate the chest. Also, dedicated MRI of the thoracic spine with contrast could further characterize thoracic extent if clinically warranted.  SOFT TISSUE NODULE, RIGHT FLANK; ULTRASOUND-GUIDED BIOPSY:  - POORLY DIFFERENTIATED CARCINOMA COMPATIBLE WITH METASTATIC  ADENOCARCINOMA OF LUNG ORIGIN.   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 Accompanied by cousin.   Crystal Mcmahon 82 y.o.  female with longstanding history  of smoking, diagnosed with stage IV lung cancer, left apical lung/multiple bone lesions, s/p radiation left neck, returns to clinic for consideration of [place]. She has been holding BP medication. She continues to have thick productive cough. No shortness of breath, chest pain, or fevers. She has been evaluated with neurology d/t history of prior stroke. No nausea or vomiting. No diarrhea. She lives at home alone with support from her cousin. Says she's ready to 'get on with it'   Review of Systems  Constitutional:  Positive for malaise/fatigue. Negative for chills, diaphoresis, fever and weight loss.  HENT:  Negative for nosebleeds and sore throat.   Eyes:  Negative for double vision.  Respiratory:  Positive for cough and sputum production. Negative for hemoptysis, shortness of breath and wheezing.   Cardiovascular:  Negative for chest pain, palpitations, orthopnea and leg swelling.  Gastrointestinal:  Negative for abdominal pain, blood in stool, constipation, diarrhea, heartburn, melena, nausea and vomiting.  Genitourinary:  Negative for dysuria, frequency and urgency.  Musculoskeletal:  Positive for neck pain. Negative for back pain and joint pain.  Skin: Negative.  Negative for itching and rash.  Neurological:  Positive for tingling, sensory change and focal weakness. Negative for dizziness, weakness and headaches.  Endo/Heme/Allergies:  Does not bruise/bleed easily.  Psychiatric/Behavioral:  Positive for depression. The patient is not nervous/anxious and does not have insomnia.     MEDICAL HISTORY:  Past Medical History:  Diagnosis Date   Actinic keratosis    Basal cell carcinoma 06/14/2020   posterior neck, [facility]   Breast cancer (HCC) 2008   left   Cancer (HCC)    melanoma   Complication  of anesthesia    slow to wake up after surgery   GERD (gastroesophageal reflux disease)    Hypertension    Melanoma (HCC) 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 Malignant melanoma, lentigo maligna  type. Clark's level II, Breslow's 0.30mm   Personal history of radiation therapy    Left breast   Personal history of tobacco use, presenting hazards to health 05/31/2015   Tobacco abuse 01/27/2015   Tobacco abuse 01/27/2015    SURGICAL HISTORY: Past Surgical History:  Procedure Laterality Date   BREAST EXCISIONAL BIOPSY Left 2008   + radation   BREAST EXCISIONAL BIOPSY Right 2001 2010   neg surgical bx's   BREAST LUMPECTOMY Left 2008   w/radiation   CATARACT EXTRACTION W/PHACO Right 04/12/2016   Procedure: Cataract extraction phaco and intraocular lens placement right eye;  Surgeon: Lockie Mola, MD;  Location: [facility];  Service: Ophthalmology;  Laterality: Right;   CATARACT EXTRACTION W/PHACO Left 01/24/2017   Procedure: CATARACT EXTRACTION PHACO AND INTRAOCULAR LENS PLACEMENT (IOC) Complicated left;  Surgeon: Lockie Mola, MD;  Location: [facility];  Service: Ophthalmology;  Laterality: Left;  Complicated [place]   CHOLECYSTECTOMY     D&C and hysteroscopy  1996   LEEP  1996   TUBAL LIGATION      SOCIAL HISTORY: Social History   Socioeconomic History   Marital status: Married    Spouse name: Not on file   Number of children: Not on file   Years of education: Not on file   Highest education level: Not on file  Occupational History   Not on file  Tobacco Use   Smoking status: Every Day    Packs/day: 0.25    Years: 32.00    Additional pack years: 0.00    Total pack years: 8.00    Types: Cigarettes   Smokeless tobacco: Never  Substance and Sexual Activity   Alcohol use: No    Alcohol/week: 0.0 standard drinks of alcohol    Comment: occassional   Drug use: No   Sexual activity: Not Currently  Other Topics Concern   Not on file  Social History Narrative   Lives by self; husband in assisted living; Smoker since 57s [college]; rare alcohol. 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 2 children- [place]/ and [place].    Social  Determinants of Health   Financial Resource Strain: Low Risk  (01/12/2023)   Overall Financial Resource Strain (CARDIA)    Difficulty of Paying Living Expenses: Not hard at all  Food Insecurity: No Food Insecurity (11/29/2022)   Hunger Vital Sign    Worried About Running Out of Food in the Last Year: Never true    Ran Out of Food in the Last Year: Never true  Transportation Needs: Unmet Transportation Needs (01/16/2023)   PRAPARE - Administrator, Civil Service (Medical): Yes    Lack of Transportation (Non-Medical): Yes  Physical Activity: Inactive (01/12/2023)   Exercise Vital Sign    Days of Exercise per Week: 0 days    Minutes of Exercise per Session: 0 min  Stress: No Stress Concern Present (01/12/2023)   [employer] of Occupational Health - Occupational Stress Questionnaire    Feeling of Stress : Only a little  Social Connections: Moderately Integrated (01/12/2023)   Social Connection and Isolation Panel [NHANES]    Frequency of Communication with Friends and Family: Twice a week    Frequency of Social Gatherings with Friends and Family: Once a week    Attends Religious Services:  1 to 4 times per year    Active Member of Clubs or Organizations: No    Attends Banker Meetings: Never    Marital Status: Married  Recent Concern: Social Connections - Moderately Isolated (01/12/2023)   Social Connection and Isolation Panel [NHANES]    Frequency of Communication with Friends and Family: Once a week    Frequency of Social Gatherings with Friends and Family: Once a week    Attends Religious Services: 1 to 4 times per year    Active Member of [employer] or Organizations: No    Attends Banker Meetings: Never    Marital Status: Married  Catering manager Violence: Not At Risk (11/29/2022)   Humiliation, Afraid, Rape, and Kick questionnaire    Fear of Current or Ex-Partner: No    Emotionally Abused: No    Physically Abused: No    Sexually Abused: No     FAMILY HISTORY: Family History  Problem Relation Age of Onset   Pancreatic cancer Father    Breast cancer Neg Hx     ALLERGIES:  is allergic to acetaminophen-pamabrom, codeine, and midol pm [diphenhydramine-apap (sleep)].  MEDICATIONS:  Current Outpatient Medications  Medication Sig Dispense Refill   acetaminophen (TYLENOL) 650 MG CR tablet Take 650 mg by mouth every 8 (eight) hours as needed for pain.     atorvastatin (LIPITOR) 20 MG tablet Take 20 mg by mouth daily.     calcium carbonate (OSCAL) 1500 (600 CA) MG TABS tablet Take 600 mg of elemental calcium by mouth 2 (two) times daily with a meal.     citalopram (CELEXA) 10 MG tablet Take 10 mg by mouth daily.     lansoprazole (PREVACID) 30 MG capsule Take 1 capsule (30 mg total) by mouth daily at 12 noon. 30 capsule 1   meloxicam (MOBIC) 15 MG tablet Take 1 tablet (15 mg total) by mouth daily. 30 tablet 3   morphine (MSIR) 15 MG tablet 1/2 -1 pill every 8- 12 hours as needed. 45 tablet 0   ondansetron (ZOFRAN) 8 MG tablet One pill every 8 hours as needed for nausea/vomitting. 40 tablet 1   sucralfate (CARAFATE) 1 g tablet Take 1 tablet (1 g total) by mouth 3 (three) times daily. Dissolve tablet in 3to 4 tables spoons warm water swish and swallow 90 tablet 1   amLODipine (NORVASC) 2.5 MG tablet Take 2.5 mg by mouth daily.  (Patient not taking: Reported on 01/19/2023)     gabapentin (NEURONTIN) 300 MG capsule Take 300 mg by mouth in the morning, at noon, and at bedtime.     triamterene-hydrochlorothiazide (DYAZIDE) 37.5-25 MG capsule Take 1 capsule by mouth daily.  (Patient not taking: Reported on 01/19/2023)  1   No current facility-administered medications for this visit.    PHYSICAL EXAMINATION: Vitals:   01/22/23 1400 01/22/23 1415  BP: (!) 81/53 94/75  Pulse: 96 95  Temp:    SpO2: (!) 76% 92%   Filed Weights   01/22/23 1351  Weight: 124 lb (56.2 kg)   Physical Exam Vitals reviewed.  Constitutional:      Appearance:  She is not ill-appearing.  HENT:     Head: Normocephalic and atraumatic.     Mouth/Throat:     Pharynx: Oropharynx is clear. No oropharyngeal exudate.  Eyes:     General: No scleral icterus. Cardiovascular:     Rate and Rhythm: Normal rate and regular rhythm.  Pulmonary:     Effort: No respiratory distress.  Breath sounds: No wheezing, rhonchi or rales.     Comments: Diminished bilaterally. Productive strong cough. Tan sputum.  Abdominal:     Palpations: Abdomen is soft.  Skin:    General: Skin is warm and dry.     Coloration: Skin is not pale.     Findings: No rash.  Neurological:     Mental Status: She is alert and oriented to person, place, and time. Mental status is at baseline.  Psychiatric:        Behavior: Behavior normal.        Judgment: Judgment normal.     LABORATORY DATA:  I have reviewed the data as listed Lab Results  Component Value Date   WBC 7.7 01/19/2023   HGB 9.7 (L) 01/19/2023   HCT 30.3 (L) 01/19/2023   MCV 95.9 01/19/2023   PLT 293 01/19/2023   Recent Labs    01/05/23 1036 01/13/23 1742 01/19/23 1502  NA 135 135 136  K 3.9 4.1 4.3  CL 99 99 97*  CO2 29 29 28   GLUCOSE 97 178* 105*  BUN 31* 61* 34*  CREATININE 0.97 1.44* 1.14*  CALCIUM 8.8* 8.2* 8.7*  GFRNONAA 58* 36* 48*  PROT 6.1* 6.0* 5.8*  ALBUMIN 3.2* 2.8* 2.2*  AST 18 62* 38  ALT 20 61* 27  ALKPHOS 56 69 54  BILITOT 0.3 0.6 0.4     RADIOGRAPHIC STUDIES: I have personally reviewed the radiological images as listed and agreed with the findings in the report. DG Chest 2 View  Result Date: 01/13/2023 CLINICAL DATA:  Hypotensive, immunocompromise. 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 There are likely small bilateral pleural effusions. There is no focal lung infiltrate. The cardiomediastinal silhouette is within normal limits. Bones are osteopenic. No acute fractures are seen. There  surgical clips in the right abdomen. IMPRESSION: 1. Small bilateral pleural effusions. 2. 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 Electronically Signed   By: Darliss Cheney M.D.   On: 01/13/2023 18:29   MR Brain W Wo Contrast  Addendum Date: 01/01/2023   ADDENDUM REPORT: 01/01/2023 16:19 ADDENDUM: Critical Value/emergent results were called by telephone at the time of interpretation on 01/01/2023 at 4:10 pm to provider [facility] , who verbally acknowledged these results. Electronically Signed   By: Orvan Falconer M.D.   On: 01/01/2023 16:19   Result Date: 01/01/2023 CLINICAL DATA:  Metastatic disease evaluation. Left arm numbness since January. EXAM: MRI HEAD WITHOUT AND WITH CONTRAST TECHNIQUE: Multiplanar, multiecho pulse sequences of the brain and surrounding structures were obtained without and with intravenous contrast. CONTRAST:  5mL GADAVIST GADOBUTROL 1 MMOL/ML IV SOLN COMPARISON:  None Available. 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 No acute hemorrhage. Chronic microhemorrhage in the left superior frontal gyrus. Moderate chronic small-vessel disease. No hydrocephalus or extra-axial collection. No mass or abnormal enhancement. Vascular: Normal flow voids and enhancement. Skull and upper cervical spine: Normal marrow signal and enhancement. Sinuses/Orbits: Unremarkable. Other: None. IMPRESSION: 1. 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 2. No evidence of intracranial metastatic disease. Radiology assistant personnel have been notified to put me in telephone contact with the referring physician or the referring physician's clinical representative in order to discuss these findings. Once this communication is established I will issue an addendum to this report for documentation purposes. Electronically Signed: By: Zollie Beckers  Wiggins M.D. On: 01/01/2023 16:03     Assessment & Plan:  No  problem-specific Assessment & Plan notes found for this encounter.  # Left upper lobe lung cancer-# MARCH 5th 2024- MRI cervical spine- concerning for metastatic disease to the multiple vertebral bodies/bone; also involvement of the neural foramina; with left apical lung mass. PET March, 19th, 2024-  Hypermetabolic soft tissue mass of the left lung apex with associated hypermetabolic left apical pleural thickening, compatible with primary lung malignancy. Enlarged and hypermetabolic mediastinal and left supraclavicular lymph nodes, compatible with metastatic disease. Hypermetabolic osseous metastatic disease of the lower cervical and upper thoracic spine. Hypermetabolic subcutaneous soft tissue nodule of the right lower back, concerning for soft tissue metastatic disease. Numerous ground-glass nodular opacities are seen in the bilateral upper lobes, largest nodules demonstrate minimal FDG uptake and are increased in size  SOFT TISSUE NODULE, RIGHT FLANK; ULTRASOUND-GUIDED BIOPSY: - POORLY DIFFERENTIATED CARCINOMA COMPATIBLE WITH METASTATIC  ADENOCARCINOMA OF LUNG ORIGIN. F-ONE NGS- PDL-1- 98%.  NEG for novel mutations- see re: RAD; last RT- 4/29-2024. Given PD-L1 greater than 90% recommend single agent Keytruda.   Labs reviewed and acceptable for treatment. Proceed with keytruda #1 today. Again reviewed risks and potential side effects of keytruda as well as rationale for treatment.   # Hypoxia- new oxygen requirement. Likely related to malignancy. Clinically she is asymptomatic. SpO2 improved on 2L oxygen. Oxygen ordered and will delivered to clinic. Cousin will assist patient with home monitoring to maintain SpO2 >92%. If symptoms worsen, low threshold for starting antibiotics. High risk for pneumonia.   # Mild anemia hemoglobin around 9-10. Ferritin was normal however, iron sat decreased at 8%. Likely related to poor intake of iron. No black or bloody stools. Can consider starting IV iron/Venofer at next  visit.    # Hypotension- uncertain etiology. Now off BP medication. S/p dex taper. Poor intake d/t radiation esophagitis but improved. Continue holding BP medication. Plan for IV fluids today. Encourage dhome monitoring. If persistently low, consider workup vs scheduled IV fluids.    # Active smoker- 5 cigarettes a day. Encouraged cessation. Could consider chantix or wellbutrin. Declined today.    # Cancer related pain- Neck/left arm pain secondary to malignancy and bone metastases. Cervical spine MRI- March 2024 No evidence of any cord compression.  Currently morphine sulfate IR 15 mg nightly well-controlled. Continue current pain regimen. Vitamin D April 2024-30; on calcium plus vitamin D.  Zometa 2 mg IV- stable.    # MRI Brain-incidental [APRIL 2024]- acute stroke coronary radiata left side- s/p eval Dr. Sherryll Burger. On aspirin 325 mg/day and statin. Stable. Awaiting carotid duplex imaging.      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  Consider genetic evaluation. Not discussed today.    # PIV- continue to IV access; hold off the port.   # Goals of care- treatment given with palliative intent.    # Zometa- 2mg  - 4/19  DISPOSITION: Keytruda today Order home oxygen Follow up as scheduled- la   Alinda Dooms, NP 01/22/2023

## 2023-01-22 NOTE — Progress Notes (Signed)
I was asked to assess pt's abnormal blood pressure. Bp 81/53 HR: 96. Oxygen sats at room air sitting 76%. Pt placed on 2L of oxygen. Oxygen stats improved to 92% at rest on 2L of oxygen. Bp: 94/75 HR 95

## 2023-01-22 NOTE — Progress Notes (Signed)
Hand off given to Jaynie Crumble, RN. Ok to begin- New start Keytruda today.  Left vm for Eldridge Abrahams at [facility] 1526-for patient's oxygen therapy. 1536-Travis returned phone call. He will see if [employer] can deliver 1 tank of oxygen at [facility] and then arrange for subsequent tank set up at pt's residence.

## 2023-01-22 NOTE — Patient Instructions (Signed)
[facility]  Discharge Instructions: Thank you for choosing [facility] to provide your oncology and hematology care.  If you have a lab appointment with the Cancer Center, please go directly to the Cancer Center and check in at the registration area.  Wear comfortable clothing and clothing appropriate for easy access to any Portacath or PICC line.   We strive to give you quality time with your provider. You may need to reschedule your appointment if you arrive late (15 or more minutes).  Arriving late affects you and other patients whose appointments are after yours.  Also, if you miss three or more appointments without notifying the office, you may be dismissed from the clinic at the provider's discretion.      For prescription refill requests, have your pharmacy contact our office and allow 72 hours for refills to be completed.    Today you received the following chemotherapy and/or immunotherapy agents Pembrolizumab.      To help prevent nausea and vomiting after your treatment, we encourage you to take your nausea medication as directed.  BELOW ARE SYMPTOMS THAT SHOULD BE REPORTED IMMEDIATELY: *FEVER GREATER THAN 100.4 F (38 C) OR HIGHER *CHILLS OR SWEATING *NAUSEA AND VOMITING THAT IS NOT CONTROLLED WITH YOUR NAUSEA MEDICATION *UNUSUAL SHORTNESS OF BREATH *UNUSUAL BRUISING OR BLEEDING *URINARY PROBLEMS (pain or burning when urinating, or frequent urination) *BOWEL PROBLEMS (unusual diarrhea, constipation, pain near the anus) TENDERNESS IN MOUTH AND THROAT WITH OR WITHOUT PRESENCE OF ULCERS (sore throat, sores in mouth, or a toothache) UNUSUAL RASH, SWELLING OR PAIN  UNUSUAL VAGINAL DISCHARGE OR ITCHING   Items with * indicate a potential emergency and should be followed up as soon as possible or go to the Emergency Department if any problems should occur.  Please show the CHEMOTHERAPY ALERT CARD or IMMUNOTHERAPY ALERT CARD at  check-in to the Emergency Department and triage nurse.  Should you have questions after your visit or need to cancel or reschedule your appointment, please contact [facility]  [phone] and follow the prompts.  Office hours are 8:00 a.m. to 4:30 p.m. Monday - Friday. Please note that voicemails left after 4:00 p.m. may not be returned until the following business day.  We are closed weekends and major holidays. You have access to a nurse at all times for urgent questions. Please call the main number to the clinic [phone] and follow the prompts.  For any non-urgent questions, you may also contact your provider using MyChart. We now offer e-Visits for anyone 4 and older to request care online for non-urgent symptoms. For details visit [email or web address].   Also download the MyChart app! Go to the app store, search "MyChart", open the app, select [place], and log in with your MyChart username and password.

## 2023-01-23 ENCOUNTER — Inpatient Hospital Stay: Payer: Medicare Other

## 2023-01-23 ENCOUNTER — Emergency Department: Payer: Medicare Other

## 2023-01-23 ENCOUNTER — Encounter: Payer: Self-pay | Admitting: Student in an Organized Health Care Education/Training Program

## 2023-01-23 ENCOUNTER — Other Ambulatory Visit: Payer: Self-pay | Admitting: Nurse Practitioner

## 2023-01-23 ENCOUNTER — Telehealth: Payer: Self-pay | Admitting: *Deleted

## 2023-01-23 ENCOUNTER — Encounter: Payer: Self-pay | Admitting: Internal Medicine

## 2023-01-23 ENCOUNTER — Inpatient Hospital Stay
Admission: EM | Admit: 2023-01-23 | Discharge: 2023-02-07 | DRG: 853 | Disposition: A | Discharge status: Skilled Nursing Facility | Payer: Medicare Other | Attending: Family Medicine | Admitting: Family Medicine

## 2023-01-23 ENCOUNTER — Other Ambulatory Visit: Payer: Self-pay

## 2023-01-23 DIAGNOSIS — Z923 Personal history of irradiation: Secondary | ICD-10-CM | POA: Diagnosis not present

## 2023-01-23 DIAGNOSIS — Z66 Do not resuscitate: Secondary | ICD-10-CM | POA: Diagnosis present

## 2023-01-23 DIAGNOSIS — Z85820 Personal history of malignant melanoma of skin: Secondary | ICD-10-CM

## 2023-01-23 DIAGNOSIS — J9621 Acute and chronic respiratory failure with hypoxia: Secondary | ICD-10-CM | POA: Diagnosis present

## 2023-01-23 DIAGNOSIS — J918 Pleural effusion in other conditions classified elsewhere: Secondary | ICD-10-CM | POA: Diagnosis present

## 2023-01-23 DIAGNOSIS — L89012 Pressure ulcer of right elbow, stage 2: Secondary | ICD-10-CM | POA: Diagnosis present

## 2023-01-23 DIAGNOSIS — R571 Hypovolemic shock: Secondary | ICD-10-CM | POA: Diagnosis present

## 2023-01-23 DIAGNOSIS — I38 Endocarditis, valve unspecified: Secondary | ICD-10-CM | POA: Diagnosis not present

## 2023-01-23 DIAGNOSIS — C3430 Malignant neoplasm of lower lobe, unspecified bronchus or lung: Secondary | ICD-10-CM | POA: Diagnosis not present

## 2023-01-23 DIAGNOSIS — Z515 Encounter for palliative care: Secondary | ICD-10-CM

## 2023-01-23 DIAGNOSIS — K219 Gastro-esophageal reflux disease without esophagitis: Secondary | ICD-10-CM | POA: Diagnosis present

## 2023-01-23 DIAGNOSIS — E876 Hypokalemia: Secondary | ICD-10-CM | POA: Diagnosis not present

## 2023-01-23 DIAGNOSIS — Z886 Allergy status to analgesic agent status: Secondary | ICD-10-CM

## 2023-01-23 DIAGNOSIS — C7989 Secondary malignant neoplasm of other specified sites: Secondary | ICD-10-CM | POA: Diagnosis present

## 2023-01-23 DIAGNOSIS — Z79899 Other long term (current) drug therapy: Secondary | ICD-10-CM

## 2023-01-23 DIAGNOSIS — C7951 Secondary malignant neoplasm of bone: Secondary | ICD-10-CM | POA: Diagnosis present

## 2023-01-23 DIAGNOSIS — Z7960 Long term (current) use of unspecified immunomodulators and immunosuppressants: Secondary | ICD-10-CM

## 2023-01-23 DIAGNOSIS — R6521 Severe sepsis with septic shock: Secondary | ICD-10-CM | POA: Diagnosis present

## 2023-01-23 DIAGNOSIS — K259 Gastric ulcer, unspecified as acute or chronic, without hemorrhage or perforation: Secondary | ICD-10-CM | POA: Diagnosis present

## 2023-01-23 DIAGNOSIS — C3412 Malignant neoplasm of upper lobe, left bronchus or lung: Secondary | ICD-10-CM | POA: Diagnosis present

## 2023-01-23 DIAGNOSIS — J9601 Acute respiratory failure with hypoxia: Secondary | ICD-10-CM | POA: Diagnosis not present

## 2023-01-23 DIAGNOSIS — J850 Gangrene and necrosis of lung: Secondary | ICD-10-CM | POA: Diagnosis present

## 2023-01-23 DIAGNOSIS — Y842 Radiological procedure and radiotherapy as the cause of abnormal reaction of the patient, or of later complication, without mention of misadventure at the time of the procedure: Secondary | ICD-10-CM | POA: Diagnosis present

## 2023-01-23 DIAGNOSIS — K208 Other esophagitis without bleeding: Secondary | ICD-10-CM | POA: Diagnosis present

## 2023-01-23 DIAGNOSIS — R34 Anuria and oliguria: Secondary | ICD-10-CM | POA: Diagnosis present

## 2023-01-23 DIAGNOSIS — Z1152 Encounter for screening for COVID-19: Secondary | ICD-10-CM

## 2023-01-23 DIAGNOSIS — Z7982 Long term (current) use of aspirin: Secondary | ICD-10-CM

## 2023-01-23 DIAGNOSIS — C3490 Malignant neoplasm of unspecified part of unspecified bronchus or lung: Secondary | ICD-10-CM | POA: Diagnosis not present

## 2023-01-23 DIAGNOSIS — I10 Essential (primary) hypertension: Secondary | ICD-10-CM | POA: Diagnosis present

## 2023-01-23 DIAGNOSIS — K922 Gastrointestinal hemorrhage, unspecified: Secondary | ICD-10-CM | POA: Diagnosis not present

## 2023-01-23 DIAGNOSIS — Z9889 Other specified postprocedural states: Secondary | ICD-10-CM

## 2023-01-23 DIAGNOSIS — Z800 Family history of malignant neoplasm of digestive organs: Secondary | ICD-10-CM

## 2023-01-23 DIAGNOSIS — D509 Iron deficiency anemia, unspecified: Secondary | ICD-10-CM | POA: Diagnosis present

## 2023-01-23 DIAGNOSIS — N170 Acute kidney failure with tubular necrosis: Secondary | ICD-10-CM | POA: Diagnosis present

## 2023-01-23 DIAGNOSIS — Z85850 Personal history of malignant neoplasm of thyroid: Secondary | ICD-10-CM

## 2023-01-23 DIAGNOSIS — I7789 Other specified disorders of arteries and arterioles: Secondary | ICD-10-CM | POA: Diagnosis not present

## 2023-01-23 DIAGNOSIS — R54 Age-related physical debility: Secondary | ICD-10-CM | POA: Diagnosis present

## 2023-01-23 DIAGNOSIS — Z853 Personal history of malignant neoplasm of breast: Secondary | ICD-10-CM

## 2023-01-23 DIAGNOSIS — C50919 Malignant neoplasm of unspecified site of unspecified female breast: Secondary | ICD-10-CM | POA: Diagnosis not present

## 2023-01-23 DIAGNOSIS — K260 Acute duodenal ulcer with hemorrhage: Secondary | ICD-10-CM | POA: Diagnosis not present

## 2023-01-23 DIAGNOSIS — I700 Atherosclerosis of aorta: Secondary | ICD-10-CM | POA: Diagnosis present

## 2023-01-23 DIAGNOSIS — K449 Diaphragmatic hernia without obstruction or gangrene: Secondary | ICD-10-CM | POA: Diagnosis present

## 2023-01-23 DIAGNOSIS — E870 Hyperosmolality and hypernatremia: Secondary | ICD-10-CM | POA: Insufficient documentation

## 2023-01-23 DIAGNOSIS — R000 Tachycardia, unspecified: Secondary | ICD-10-CM | POA: Diagnosis not present

## 2023-01-23 DIAGNOSIS — J15211 Pneumonia due to Methicillin susceptible Staphylococcus aureus: Secondary | ICD-10-CM

## 2023-01-23 DIAGNOSIS — L8990 Pressure ulcer of unspecified site, unspecified stage: Secondary | ICD-10-CM | POA: Insufficient documentation

## 2023-01-23 DIAGNOSIS — F17210 Nicotine dependence, cigarettes, uncomplicated: Secondary | ICD-10-CM | POA: Diagnosis present

## 2023-01-23 DIAGNOSIS — A419 Sepsis, unspecified organism: Principal | ICD-10-CM | POA: Diagnosis present

## 2023-01-23 DIAGNOSIS — G928 Other toxic encephalopathy: Secondary | ICD-10-CM | POA: Diagnosis not present

## 2023-01-23 DIAGNOSIS — I639 Cerebral infarction, unspecified: Secondary | ICD-10-CM | POA: Diagnosis present

## 2023-01-23 DIAGNOSIS — I2489 Other forms of acute ischemic heart disease: Secondary | ICD-10-CM | POA: Diagnosis present

## 2023-01-23 DIAGNOSIS — Z885 Allergy status to narcotic agent status: Secondary | ICD-10-CM

## 2023-01-23 DIAGNOSIS — Z8673 Personal history of transient ischemic attack (TIA), and cerebral infarction without residual deficits: Secondary | ICD-10-CM

## 2023-01-23 DIAGNOSIS — J189 Pneumonia, unspecified organism: Secondary | ICD-10-CM

## 2023-01-23 DIAGNOSIS — Z5982 Transportation insecurity: Secondary | ICD-10-CM

## 2023-01-23 DIAGNOSIS — J690 Pneumonitis due to inhalation of food and vomit: Secondary | ICD-10-CM | POA: Insufficient documentation

## 2023-01-23 DIAGNOSIS — Z888 Allergy status to other drugs, medicaments and biological substances status: Secondary | ICD-10-CM

## 2023-01-23 DIAGNOSIS — E440 Moderate protein-calorie malnutrition: Secondary | ICD-10-CM | POA: Diagnosis present

## 2023-01-23 DIAGNOSIS — D62 Acute posthemorrhagic anemia: Secondary | ICD-10-CM | POA: Diagnosis not present

## 2023-01-23 DIAGNOSIS — Z85828 Personal history of other malignant neoplasm of skin: Secondary | ICD-10-CM

## 2023-01-23 DIAGNOSIS — K264 Chronic or unspecified duodenal ulcer with hemorrhage: Secondary | ICD-10-CM | POA: Diagnosis not present

## 2023-01-23 DIAGNOSIS — Z9981 Dependence on supplemental oxygen: Secondary | ICD-10-CM

## 2023-01-23 DIAGNOSIS — K2960 Other gastritis without bleeding: Secondary | ICD-10-CM | POA: Diagnosis present

## 2023-01-23 DIAGNOSIS — Z720 Tobacco use: Secondary | ICD-10-CM | POA: Diagnosis not present

## 2023-01-23 DIAGNOSIS — D696 Thrombocytopenia, unspecified: Secondary | ICD-10-CM | POA: Insufficient documentation

## 2023-01-23 DIAGNOSIS — R64 Cachexia: Secondary | ICD-10-CM | POA: Diagnosis present

## 2023-01-23 DIAGNOSIS — Z791 Long term (current) use of non-steroidal anti-inflammatories (NSAID): Secondary | ICD-10-CM

## 2023-01-23 LAB — CBC WITH DIFFERENTIAL/PLATELET
Abs Immature Granulocytes: 0.41 10*3/uL — ABNORMAL HIGH (ref 0.00–0.07)
Basophils Absolute: 0 10*3/uL (ref 0.0–0.1)
Basophils Relative: 1 %
Eosinophils Absolute: 0 10*3/uL (ref 0.0–0.5)
Eosinophils Relative: 0 %
HCT: 27.5 % — ABNORMAL LOW (ref 36.0–46.0)
Hemoglobin: 8.8 g/dL — ABNORMAL LOW (ref 12.0–15.0)
Immature Granulocytes: 7 %
Lymphocytes Relative: 12 %
Lymphs Abs: 0.8 10*3/uL (ref 0.7–4.0)
MCH: 30.7 pg (ref 26.0–34.0)
MCHC: 32 g/dL (ref 30.0–36.0)
MCV: 95.8 fL (ref 80.0–100.0)
Monocytes Absolute: 0.2 10*3/uL (ref 0.1–1.0)
Monocytes Relative: 4 %
Neutro Abs: 4.9 10*3/uL (ref 1.7–7.7)
Neutrophils Relative %: 76 %
Platelets: 340 10*3/uL (ref 150–400)
RBC: 2.87 MIL/uL — ABNORMAL LOW (ref 3.87–5.11)
RDW: 14.8 % (ref 11.5–15.5)
Smear Review: NORMAL
WBC: 6.3 10*3/uL (ref 4.0–10.5)
nRBC: 1.6 % — ABNORMAL HIGH (ref 0.0–0.2)

## 2023-01-23 LAB — CREATININE, SERUM
Creatinine, Ser: 0.93 mg/dL (ref 0.44–1.00)
GFR, Estimated: >60 mL/min (ref >60)

## 2023-01-23 LAB — COMPREHENSIVE METABOLIC PANEL
ALT: 24 U/L (ref 0–44)
AST: 44 U/L — ABNORMAL HIGH (ref 15–41)
Albumin: 1.9 g/dL — ABNORMAL LOW (ref 3.5–5.0)
Alkaline Phosphatase: 59 U/L (ref 38–126)
Anion gap: 15 (ref 5–15)
BUN: 29 mg/dL — ABNORMAL HIGH (ref 8–23)
CO2: 23 mmol/L (ref 22–32)
Calcium: 8.6 mg/dL — ABNORMAL LOW (ref 8.9–10.3)
Chloride: 98 mmol/L (ref 98–111)
Creatinine, Ser: 1.1 mg/dL — ABNORMAL HIGH (ref 0.44–1.00)
GFR, Estimated: 50 mL/min — ABNORMAL LOW (ref >60)
Glucose, Bld: 90 mg/dL (ref 70–99)
Potassium: 4.3 mmol/L (ref 3.5–5.1)
Sodium: 136 mmol/L (ref 135–145)
Total Bilirubin: 1 mg/dL (ref 0.3–1.2)
Total Protein: 5.6 g/dL — ABNORMAL LOW (ref 6.5–8.1)

## 2023-01-23 LAB — CBC
HCT: 26.1 % — ABNORMAL LOW (ref 36.0–46.0)
Hemoglobin: 8.1 g/dL — ABNORMAL LOW (ref 12.0–15.0)
MCH: 30.7 pg (ref 26.0–34.0)
MCHC: 31 g/dL (ref 30.0–36.0)
MCV: 98.9 fL (ref 80.0–100.0)
Platelets: 303 10*3/uL (ref 150–400)
RBC: 2.64 MIL/uL — ABNORMAL LOW (ref 3.87–5.11)
RDW: 15 % (ref 11.5–15.5)
WBC: 6.1 10*3/uL (ref 4.0–10.5)
nRBC: 0.8 % — ABNORMAL HIGH (ref 0.0–0.2)

## 2023-01-23 LAB — URINALYSIS, ROUTINE W REFLEX MICROSCOPIC
Bilirubin Urine: NEGATIVE
Glucose, UA: NEGATIVE mg/dL
Hgb urine dipstick: NEGATIVE
Ketones, ur: 5 mg/dL — AB
Leukocytes,Ua: NEGATIVE
Nitrite: NEGATIVE
Protein, ur: 30 mg/dL — AB
Specific Gravity, Urine: 1.02 (ref 1.005–1.030)
pH: 5 (ref 5.0–8.0)

## 2023-01-23 LAB — MRSA NEXT GEN BY PCR, NASAL: MRSA by PCR Next Gen: NOT DETECTED

## 2023-01-23 LAB — BLOOD GAS, VENOUS
Acid-Base Excess: 3.3 mmol/L — ABNORMAL HIGH (ref 0.0–2.0)
Bicarbonate: 29.1 mmol/L — ABNORMAL HIGH (ref 20.0–28.0)
O2 Saturation: 72.8 %
Patient temperature: 37
pCO2, Ven: 48 mmHg (ref 44–60)
pH, Ven: 7.39 (ref 7.25–7.43)
pO2, Ven: 42 mmHg (ref 32–45)

## 2023-01-23 LAB — TROPONIN I (HIGH SENSITIVITY)
Troponin I (High Sensitivity): 30 ng/L — ABNORMAL HIGH (ref <18)
Troponin I (High Sensitivity): 31 ng/L — ABNORMAL HIGH (ref <18)

## 2023-01-23 LAB — RESP PANEL BY RT-PCR (RSV, FLU A&B, COVID)  RVPGX2
Influenza A by PCR: NEGATIVE
Influenza B by PCR: NEGATIVE
Resp Syncytial Virus by PCR: NEGATIVE
SARS Coronavirus 2 by RT PCR: NEGATIVE

## 2023-01-23 LAB — TSH: TSH: 0.711 u[IU]/mL (ref 0.350–4.500)

## 2023-01-23 LAB — LACTIC ACID, PLASMA
Lactic Acid, Venous: 1.9 mmol/L (ref 0.5–1.9)
Lactic Acid, Venous: 1 mmol/L (ref 0.5–1.9)

## 2023-01-23 LAB — PROCALCITONIN: Procalcitonin: 0.83 ng/mL

## 2023-01-23 LAB — PROTIME-INR
INR: 1.1 (ref 0.8–1.2)
Prothrombin Time: 14.8 seconds (ref 11.4–15.2)

## 2023-01-23 LAB — GLUCOSE, CAPILLARY: Glucose-Capillary: 107 mg/dL — ABNORMAL HIGH (ref 70–99)

## 2023-01-23 MED ORDER — SODIUM CHLORIDE 0.9 % IV SOLN
250.0000 mL | INTRAVENOUS | Status: DC
  Administered 2023-01-23 – 2023-02-06 (×5): 250 mL via INTRAVENOUS

## 2023-01-23 MED ORDER — ACETAMINOPHEN 500 MG PO TABS
1000.0000 mg | ORAL_TABLET | Freq: Once | ORAL | Status: DC
  Filled 2023-01-23: qty 2

## 2023-01-23 MED ORDER — SODIUM CHLORIDE 0.9 % IV BOLUS (SEPSIS)
250.0000 mL | Freq: Once | INTRAVENOUS | Status: AC
  Administered 2023-01-23: 250 mL via INTRAVENOUS

## 2023-01-23 MED ORDER — METRONIDAZOLE 500 MG/100ML IV SOLN
500.0000 mg | Freq: Once | INTRAVENOUS | Status: AC
  Administered 2023-01-23: 500 mg via INTRAVENOUS
  Filled 2023-01-23: qty 100

## 2023-01-23 MED ORDER — MORPHINE SULFATE 15 MG PO TABS
7.5000 mg | ORAL_TABLET | Freq: Three times a day (TID) | ORAL | Status: DC | PRN
  Administered 2023-01-23 – 2023-01-27 (×3): 7.5 mg via ORAL
  Filled 2023-01-23 (×3): qty 1

## 2023-01-23 MED ORDER — VANCOMYCIN HCL 1250 MG/250ML IV SOLN
1250.0000 mg | Freq: Once | INTRAVENOUS | Status: AC
  Administered 2023-01-23: 1250 mg via INTRAVENOUS
  Filled 2023-01-23: qty 250

## 2023-01-23 MED ORDER — ACETAMINOPHEN 325 MG RE SUPP
650.0000 mg | Freq: Once | RECTAL | Status: AC
  Administered 2023-01-23: 650 mg via RECTAL
  Filled 2023-01-23: qty 2

## 2023-01-23 MED ORDER — LEVOFLOXACIN 500 MG PO TABS: 500.0000 mg | ORAL_TABLET | Freq: Every day | ORAL | 0 refills | Status: DC

## 2023-01-23 MED ORDER — VANCOMYCIN HCL IN DEXTROSE 1-5 GM/200ML-% IV SOLN: 1000.0000 mg | Freq: Once | INTRAVENOUS | Status: DC

## 2023-01-23 MED ORDER — CHLORHEXIDINE GLUCONATE CLOTH 2 % EX PADS
6.0000 | MEDICATED_PAD | Freq: Every day | CUTANEOUS | Status: DC
  Administered 2023-01-23 – 2023-01-25 (×2): 6 via TOPICAL

## 2023-01-23 MED ORDER — HYDROCORTISONE SOD SUC (PF) 100 MG IJ SOLR
100.0000 mg | Freq: Two times a day (BID) | INTRAMUSCULAR | Status: DC
  Administered 2023-01-23 – 2023-01-26 (×6): 100 mg via INTRAVENOUS
  Filled 2023-01-23 (×6): qty 2

## 2023-01-23 MED ORDER — SODIUM CHLORIDE 0.9 % IV BOLUS (SEPSIS)
1000.0000 mL | Freq: Once | INTRAVENOUS | Status: AC
  Administered 2023-01-23: 1000 mL via INTRAVENOUS

## 2023-01-23 MED ORDER — IPRATROPIUM-ALBUTEROL 0.5-2.5 (3) MG/3ML IN SOLN
3.0000 mL | RESPIRATORY_TRACT | Status: DC
  Administered 2023-01-23 – 2023-01-24 (×4): 3 mL via RESPIRATORY_TRACT
  Filled 2023-01-23 (×4): qty 3

## 2023-01-23 MED ORDER — NOREPINEPHRINE 4 MG/250ML-% IV SOLN
2.0000 ug/min | INTRAVENOUS | Status: DC
  Administered 2023-01-23: 2 ug/min via INTRAVENOUS
  Filled 2023-01-23: qty 250

## 2023-01-23 MED ORDER — IPRATROPIUM-ALBUTEROL 0.5-2.5 (3) MG/3ML IN SOLN
3.0000 mL | Freq: Four times a day (QID) | RESPIRATORY_TRACT | Status: DC | PRN
  Administered 2023-01-25 – 2023-02-01 (×2): 3 mL via RESPIRATORY_TRACT
  Filled 2023-01-23 (×2): qty 3

## 2023-01-23 MED ORDER — SODIUM CHLORIDE 0.9 % IV BOLUS (SEPSIS)
500.0000 mL | Freq: Once | INTRAVENOUS | Status: AC
  Administered 2023-01-23: 500 mL via INTRAVENOUS

## 2023-01-23 MED ORDER — SODIUM CHLORIDE 0.9 % IV SOLN
2.0000 g | Freq: Once | INTRAVENOUS | Status: AC
  Administered 2023-01-23: 2 g via INTRAVENOUS
  Filled 2023-01-23: qty 12.5

## 2023-01-23 MED ORDER — ONDANSETRON HCL 4 MG/2ML IJ SOLN
4.0000 mg | Freq: Four times a day (QID) | INTRAMUSCULAR | Status: DC | PRN
  Administered 2023-01-25 – 2023-01-30 (×2): 4 mg via INTRAVENOUS
  Filled 2023-01-23 (×2): qty 2

## 2023-01-23 MED ORDER — ENOXAPARIN SODIUM 30 MG/0.3ML IJ SOSY
30.0000 mg | PREFILLED_SYRINGE | INTRAMUSCULAR | Status: DC
  Administered 2023-01-23: 30 mg via SUBCUTANEOUS
  Filled 2023-01-23: qty 0.3

## 2023-01-23 MED ORDER — ALBUMIN HUMAN 5 % IV SOLN
12.5000 g | Freq: Once | INTRAVENOUS | Status: AC
  Administered 2023-01-23: 12.5 g via INTRAVENOUS
  Filled 2023-01-23: qty 250

## 2023-01-23 MED ORDER — POLYETHYLENE GLYCOL 3350 17 G PO PACK: 17.0000 g | PACK | Freq: Every day | ORAL | Status: DC | PRN

## 2023-01-23 MED ORDER — IOHEXOL 300 MG/ML  SOLN
75.0000 mL | Freq: Once | INTRAMUSCULAR | Status: AC | PRN
  Administered 2023-01-23: 75 mL via INTRAVENOUS

## 2023-01-23 MED ORDER — DOCUSATE SODIUM 100 MG PO CAPS: 100.0000 mg | ORAL_CAPSULE | Freq: Two times a day (BID) | ORAL | Status: DC | PRN

## 2023-01-23 NOTE — ED Notes (Signed)
Pt alert, NAD, calm, interactive, resps e/u, skin W&D, resps e/u. Family at [facility]. Xray into room, at [facility]. Urine sent.

## 2023-01-23 NOTE — Sepsis Progress Note (Signed)
Elink will follow per sepsis protocol  

## 2023-01-23 NOTE — ED Notes (Addendum)
EDP at BS, BP low, MAP 62, will continue to monitor. IVF infusing. Family at [facility].

## 2023-01-23 NOTE — Consult Note (Signed)
Pharmacy Antibiotic Note  Crystal Mcmahon is a 82 y.o. female admitted on 01/23/2023 with sepsis.  Pharmacy has been consulted for vancomycin dosing.  Vancomycin 1250 mg IV x 1 given 5/7 @ 1321  Plan: Start vancomycin 1 gram IV every 36 hours Goal AUC 400-550 Estimated AUC 497.6, Cmin 11.1 Wt 56.2 kg, Scr 0.93, Vd coefficient 0.72 Vancomycin levels at steady state or as clinically indicated Follow renal function and cultures for adjustments  Weight: 56.2 kg (123 lb 14.4 oz)  Temp (24hrs), Avg:101.1 F (38.4 C), Min:101.1 F (38.4 C), Max:101.1 F (38.4 C)  Recent Labs  Lab 01/19/23 1502 01/23/23 1212 01/23/23 1443  WBC 7.7 6.3 6.1  CREATININE 1.14* 1.10* 0.93  LATICACIDVEN  --  1.9 1.0    Estimated Creatinine Clearance: 34.6 mL/min (by C-G formula based on SCr of 0.93 mg/dL).    Allergies  Allergen Reactions   Acetaminophen-Pamabrom Rash and Swelling    Other reaction(s): Other (See Comments)  Other Reaction: RASH & SWELLING   Codeine Nausea And Vomiting   Midol Pm [Diphenhydramine-Apap (Sleep)] Swelling and Rash         Antimicrobials this admission: Vancomycin 5/7 >>  Cefepime x 1 5/7 in ED Flagyl x 1 5/7 in ED  Dose adjustments this admission: N/A  Microbiology results: 5/7 BCx: pending   Thank you for allowing pharmacy to be a part of this patient's care.  Barrie Folk, PharmD 01/23/2023 4:05 PM

## 2023-01-23 NOTE — Progress Notes (Signed)
Vasopressor no longer infusing at this time. Tomasita Morrow, RN VAST

## 2023-01-23 NOTE — Consult Note (Signed)
CODE SEPSIS - PHARMACY COMMUNICATION  **Broad Spectrum Antibiotics should be administered within 1 hour of Sepsis diagnosis**  Time Code Sepsis Called/Page Received: 1220  Antibiotics Ordered: cefepime, vancomycin, and Flagyl  Time of 1st antibiotic administration: 1230  Additional action taken by pharmacy: N/A  Barrie Folk ,PharmD Clinical Pharmacist  01/23/2023  12:24 PM

## 2023-01-23 NOTE — Consult Note (Signed)
PHARMACY -  BRIEF ANTIBIOTIC NOTE   Pharmacy has received consult(s) for cefepime and vancomycin dosing from an ED provider.  The patient's profile has been reviewed for ht/wt/allergies/indication/available labs.    One time order(s) placed for vancomycin 1250 mg IV x 1 and cefepime 2 grams IV x 1  Further antibiotics/pharmacy consults should be ordered by admitting physician if indicated.                       Thank you, Barrie Folk, PharmD 01/23/2023  12:23 PM

## 2023-01-23 NOTE — ED Triage Notes (Signed)
Arrived via POV. Pt has stage 4 lung cancer that has went to the bone. Pt last infusion was yesterday and since than pt has felt ill. Pt is febrile and tachy with a low o2 sat. Pt normally wears 2l/min via [place]. Pt initial sat in tirage was 84%. RN increased pt o2 to 4l/min via [place] and pt sat is 96% at this time.

## 2023-01-23 NOTE — Telephone Encounter (Signed)
Pt's family member called back to report that temperature has increased to 101.5 at this time.

## 2023-01-23 NOTE — Consult Note (Signed)
PHARMACY CONSULT NOTE  Pharmacy Consult for Electrolyte Monitoring and Replacement   Recent Labs: Potassium (mmol/L)  Date Value  01/23/2023 4.3  12/30/2014 3.0 (L)   Calcium (mg/dL)  Date Value  16/06/9603 8.6 (L)   Calcium, Total (mg/dL)  Date Value  54/05/8118 9.2   Albumin (g/dL)  Date Value  14/78/2956 1.9 (L)  12/30/2014 4.1   Sodium (mmol/L)  Date Value  01/23/2023 136  12/30/2014 136     Assessment: 82 yo female presented to the ED with fevers, tachycardia, and hypoxia.  Patient has stage 4 lung cancer currently undergoing chemotherapy with last treatment 5/6 with Pembrolizumab 200 mg.  In the ED patient received broad spectrum antibiotics and required initiation of norepinephrine.  Patient being admitted to the ICU for further care.  Goal of Therapy:  WNL  Plan:  No indication for replacement at this time Follow-up BMP, magnesium, and phosphorus 01/24/23  Barrie Folk ,PharmD Clinical Pharmacist 01/23/2023 2:23 PM

## 2023-01-23 NOTE — H&P (Addendum)
NAME:  Crystal Mcmahon, MRN:  [id number], DOB:  Sep 10, 1941, LOS: 0 ADMISSION DATE:  01/23/2023, CONSULTATION DATE: 01/23/2023 REFERRING MD: Dr. 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  She presented to [facility] ER on 05/7 with fevers, tachycardia, and hypoxia.  At baseline pt wears 2L O2 via nasal canula.  She has been followed by oncology and received her first dose of keytruda on 01/22/23.  She was recently taken off her antihypertensives due to hypotension and s/p dex taper.  She endorses poor po intake due to decreased appetite.    Oncology hx: Primary cancer of left upper lobe of lung  12/22/2022 Initial Diagnosis    Primary cancer of left upper lobe of lung    12/22/2022 Cancer Staging    Staging form: Lung, AJCC 8th Edition - Clinical: Stage IVB (cT2, cN2, cM1c) - Signed by Earna Coder, MD on 12/22/2022    01/19/2023 -  Chemotherapy    Patient is on Treatment Plan : LUNG NSCLC Pembrolizumab (200) q21d      ED Course Upon arrival to the ER pts O2 sats were 84% on baseline O2@2L  via nasal canula requiring increase in O2 to 4L with improvement in oxygenation.  ER vital signs were: temp 101.1 F/sbp 98/hr 118/resp rate 15/O2 sats 96% on 4L.  ER lab results: BUN 29/creatinine 1.10/calcium 8.6/albumin 1.9/AST 44/troponin 30/pct 0.83/hgb 8.8.  CXR concerning for pneumonia with trace pleural effusions and unchanged left apical mass.  Sepsis protocol initiated and pt received 1,750 ml iv fluid bolus, metronidazole, vancomycin, and cefepime.  Pt remained hypotensive requiring low dose levophed gtt.  PCCM team contacted for ICU admission.   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 New small          right-greater-than-left pleural effusions and bibasilar atelectasis. Similar left apical mass        with associated pleural thickening. Multiple new large solid pulmonary nodules,        concerning for progressive metastatic disease, although findings could also be infectious.        Recommend short-term follow-up 1-2 months for further evaluation. Stable mediastinal         and left supraclavicular lymphadenopathy. Aortic Atherosclerosis (ICD10-I70.0).  Micro Data:  COVID-19 05/07>>negative  Influenza A&B 05/07>>negative  RSV 05/07>>negative  Blood x2 05/07>>   Anti-infectives (From admission, onward)    Start     Dose/Rate Route Frequency Ordered Stop   01/23/23 1230  ceFEPIme (MAXIPIME) 2 g in sodium chloride 0.9 % 100 mL IVPB        2 g 200 mL/hr over 30 Minutes Intravenous  Once 01/23/23 1220 01/23/23 1305   01/23/23 1230  metroNIDAZOLE (FLAGYL) IVPB  500 mg        500 mg 100 mL/hr over 60 Minutes Intravenous  Once 01/23/23 1220 01/23/23 1422   01/23/23 1230  vancomycin (VANCOCIN) IVPB 1000 mg/200 mL premix  Status:  Discontinued        1,000 mg 200 mL/hr over 60 Minutes Intravenous  Once 01/23/23 1220 01/23/23 1223   01/23/23 1230  vancomycin (VANCOREADY) IVPB 1250 mg/250 mL        1,250 mg 166.7 mL/hr over 90 Minutes Intravenous  Once 01/23/23 1223 01/23/23 1650      Interim History / Subjective:  Pt currently on 4L O2 with O2 sats @92 % and requiring levophed gtt at 2 mcg/min to maintain map >65  Objective    Blood pressure (!) 113/44, pulse 96, temperature (!) 101.1 F (38.4 C), temperature source Oral, resp. rate 18, weight 56.2 kg, SpO2 95 %.        Intake/Output Summary (Last 24 hours) at 01/23/2023 1407 Last data filed at 01/23/2023 1323 Gross per 24 hour  Intake 1100 ml  Output --  Net 1100 ml   Filed Weights   01/23/23 1149  Weight: 56.2 kg   Examination: General: Acute on chronically-ill female resting in bed, NAD on 4L O2 via nasal canula  HENT: Supple, no JVD  Lungs: Faint rhonchi throughout, even, non labored  Cardiovascular: NSR, s1s2, rrr, no m/r/g, 2+ radial/2+ distal pulses, no edema  Abdomen: +BS x4, obese, soft, non tender  Extremities: Normal bulk and tone, moves all extremities  Neuro: Lethargic but oriented, following commands, PERRLA  GU: Deferred   Resolved Hospital Problem list     Assessment & Plan:  #Septic and hypovolemic shock  #Mildly elevated troponin likely secondary to demand ischemia in the setting of sepsis Hx: HTN   - Continuous telemetry monitoring  - Aggressive iv fluid resuscitation and prn levophed gtt to maintain map >65 - Hold outpatient antihypertensives  - Trend troponin's until peaked - TSH/free T3 results pending  - Cortisol level pending    #Mild AKI secondary to ATN in the setting of hypotension  - Trend BMP  - Replace electrolytes as indicated  - Monitor UOP  - Avoid nephrotoxic medications   #Sepsis secondary to pneumonia  - Trend WBC and monitor fever curve  - Trend PCT  - Follow cultures  - Will start zosyn and continue vancomycin   #Iron deficiency anemia  - Trend CBC - Monitor for s/sx of bleeding - Transfuse for hgb <7  #Acute on chronic hypoxic respiratory failure  #Pneumonia  #Pleural effusions  #Stage IV lung cancer with mets to the bone Hx: Chronic O2 @2L  O2 and Current Everyday Smoker  - Supplemental O2 for dyspnea and/or hypoxia  - Scheduled and prn bronchodilator therapy  - Smoking cessation  counseling provided  - Will consult oncology appreciate input   Protein calorie malnutrition  - Will start regular diet  - Dietitian consulted appreciate input  - Will administer albumin x1 dose   Best Practice (right click and "Reselect all SmartList Selections" daily)   Diet/type: Regular consistency (see orders) DVT prophylaxis: LMWH GI prophylaxis: N/A Lines: N/A Foley:  N/A Code Status:  DNR Last date of multidisciplinary goals of care discussion [N/A]  05/07: Discussed pts condition and current plan of care with pt and her cousin all questions were answered.  Also, clarified code status with pt and she emphatically stated she would not want to be resuscitated in the event of a cardiac arrest or mechanically  intubated.  Code status changed to DNR/DNI per pt request  Labs   CBC: Recent Labs  Lab 01/19/23 1502 01/23/23 1212  WBC 7.7 6.3  NEUTROABS 6.3 4.9  HGB 9.7* 8.8*  HCT 30.3* 27.5*  MCV 95.9 95.8  PLT 293 340    Basic Metabolic Panel: Recent Labs  Lab 01/19/23 1502 01/23/23 1212  NA 136 136  K 4.3 4.3  CL 97* 98  CO2 28 23  GLUCOSE 105* 90  BUN 34* 29*  CREATININE 1.14* 1.10*  CALCIUM 8.7* 8.6*   GFR: Estimated Creatinine Clearance: 29.2 mL/min (A) (by C-G formula based on SCr of 1.1 mg/dL (H)). Recent Labs  Lab 01/19/23 1502 01/23/23 1212  PROCALCITON  --  0.83  WBC 7.7 6.3  LATICACIDVEN  --  1.9    Liver Function Tests: Recent Labs  Lab 01/19/23 1502 01/23/23 1212  AST 38 44*  ALT 27 24  ALKPHOS 54 59  BILITOT 0.4 1.0  PROT 5.8* 5.6*  ALBUMIN 2.2* 1.9*   No results for input(s): "LIPASE", "AMYLASE" in the last 168 hours. No results for input(s): "AMMONIA" in the last 168 hours.  ABG    Component Value Date/Time   HCO3 29.1 (H) 01/23/2023 1306   O2SAT 72.8 01/23/2023 1306     Coagulation Profile: Recent Labs  Lab 01/23/23 1212  INR 1.1    Cardiac Enzymes: No results for input(s): "CKTOTAL", "CKMB", "CKMBINDEX",  "TROPONINI" in the last 168 hours.  HbA1C: No results found for: "HGBA1C"  CBG: No results for input(s): "GLUCAP" in the last 168 hours.  Review of Systems: Positives in BOLD   Gen: poor po intake, fever, chills, weight change, fatigue, night sweats HEENT: Denies blurred vision, double vision, hearing loss, tinnitus, sinus congestion, rhinorrhea, sore throat, neck stiffness, dysphagia PULM: shortness of breath, cough, sputum production, hemoptysis, wheezing CV: Denies chest pain, edema, orthopnea, paroxysmal nocturnal dyspnea, palpitations GI: Denies abdominal pain, nausea, vomiting, diarrhea, hematochezia, melena, constipation, change in bowel habits GU: Denies dysuria, hematuria, polyuria, oliguria, urethral discharge Endocrine: Denies hot or cold intolerance, polyuria, polyphagia or appetite change Derm: Denies rash, dry skin, scaling or peeling skin change Heme: Denies easy bruising, bleeding, bleeding gums Neuro: Denies headache, numbness, weakness, slurred speech, loss of memory or consciousness  Past Medical History:  She,  has a past medical history of Actinic keratosis, Basal cell carcinoma (06/14/2020), Breast cancer (HCC) (2008), Cancer [facility]), Complication of anesthesia, GERD (gastroesophageal reflux disease), Hypertension, Melanoma (HCC) (02/01/2010), Personal history of radiation therapy, Personal history of tobacco use, presenting hazards to health (05/31/2015), Tobacco abuse (01/27/2015), and Tobacco abuse (01/27/2015).   Surgical History:   Past Surgical History:  Procedure Laterality Date   BREAST EXCISIONAL BIOPSY Left 2008   + radation   BREAST EXCISIONAL BIOPSY Right 2001 2010   neg surgical bx's   BREAST LUMPECTOMY Left 2008   w/radiation   CATARACT EXTRACTION W/PHACO Right 04/12/2016   Procedure: Cataract extraction phaco and intraocular lens placement right eye;  Surgeon: Lockie Mola, MD;  Location: [facility];  Service: Ophthalmology;   Laterality: Right;   CATARACT EXTRACTION W/PHACO Left 01/24/2017   Procedure: CATARACT EXTRACTION PHACO AND INTRAOCULAR LENS PLACEMENT (IOC) Complicated left;  Surgeon: Lockie Mola, MD;  Location: [facility];  Service: Ophthalmology;  Laterality: Left;  Complicated [place]   CHOLECYSTECTOMY     D&C and hysteroscopy  1996   LEEP  1996   TUBAL LIGATION       Social History:   reports  that she has been smoking cigarettes. She has a 8.00 pack-year smoking history. She has never used smokeless tobacco. She reports that she does not drink alcohol and does not use drugs.   Family History:  Her family history includes Pancreatic cancer in her father. There is no history of Breast cancer.   Allergies Allergies  Allergen Reactions   Acetaminophen-Pamabrom Rash and Swelling    Other reaction(s): Other (See Comments)  Other Reaction: RASH & SWELLING   Codeine Nausea And Vomiting   Midol Pm [Diphenhydramine-Apap (Sleep)] Swelling and Rash          Home Medications  Prior to Admission medications   Medication Sig Start Date End Date Taking? Authorizing Provider  levofloxacin (LEVAQUIN) 500 MG tablet Take 1 tablet (500 mg total) by mouth daily for 7 days. 01/23/23 01/30/23  Alinda Dooms, NP  acetaminophen (TYLENOL) 650 MG CR tablet Take 650 mg by mouth every 8 (eight) hours as needed for pain.    [provider]  amLODipine (NORVASC) 2.5 MG tablet Take 2.5 mg by mouth daily.  Patient not taking: Reported on 01/19/2023 01/14/16   [provider]  atorvastatin (LIPITOR) 20 MG tablet Take 20 mg by mouth daily.    [provider]  calcium carbonate (OSCAL) 1500 (600 CA) MG TABS tablet Take 600 mg of elemental calcium by mouth 2 (two) times daily with a meal.    [provider]  citalopram (CELEXA) 10 MG tablet Take 10 mg by mouth daily.    [provider]  gabapentin (NEURONTIN) 300 MG capsule Take 300 mg by mouth in the morning, at noon,  and at bedtime. 11/27/22 12/27/22  [provider]  lansoprazole (PREVACID) 30 MG capsule Take 1 capsule (30 mg total) by mouth daily at 12 noon. 12/22/22   Carmina Miller, MD  meloxicam (MOBIC) 15 MG tablet Take 1 tablet (15 mg total) by mouth daily. 12/22/22   Earna Coder, MD  morphine (MSIR) 15 MG tablet 1/2 -1 pill every 8- 12 hours as needed. 01/19/23   Earna Coder, MD  ondansetron (ZOFRAN) 8 MG tablet One pill every 8 hours as needed for nausea/vomitting. 12/07/22   Earna Coder, MD  sucralfate (CARAFATE) 1 g tablet Take 1 tablet (1 g total) by mouth 3 (three) times daily. Dissolve tablet in 3to 4 tables spoons warm water swish and swallow 12/22/22   Chrystal, Sherrine Maples, MD  triamterene-hydrochlorothiazide (DYAZIDE) 37.5-25 MG capsule Take 1 capsule by mouth daily.  Patient not taking: Reported on 01/19/2023 06/10/15   [provider]     Critical care time: 60 minutes     Zada Girt, AGNP  Pulmonary/Critical Care Pager [phone] (please enter 7 digits) PCCM Consult Pager [phone] (please enter 7 digits)

## 2023-01-23 NOTE — ED Notes (Signed)
PCCM/ ICU NP at [facility]

## 2023-01-23 NOTE — Telephone Encounter (Signed)
Pt's family member, Tyler Aas, called to report that pt woke up this morning with 100.6 degree fever. Reports mild cough throughout the night.   Please advise.

## 2023-01-23 NOTE — ED Provider Notes (Signed)
[facility] Provider Note    Event Date/Time   First MD Initiated Contact with Patient 01/23/23 1212     (approximate)   History   Chief Complaint Shortness of Breath   HPI  Crystal Mcmahon is a 82 y.o. female with past medical history of hypertension, GERD, and metastatic breast cancer who presents to the ED complaining of shortness of breath.  Patient reports that she has been feeling increasingly short of breath and weak since receiving a Keytruda infusion yesterday for her metastatic breast cancer.  Her cousin at bedside states that she went to check on her this morning and found the patient very hot to touch, subsequently found her to have a fever of 101.9.  Patient reports a cough but denies any pain in her chest, has not had any abdominal pain, nausea, vomiting, or dysuria.  Patient found to be hypoxic on room air in triage, placed on 4 L nasal cannula with improvement.     Physical Exam   Triage Vital Signs: ED Triage Vitals [01/23/23 1149]  Enc Vitals Group     BP (!) 98/49     Pulse Rate (!) 118     Resp 15     Temp (!) 101.1 F (38.4 C)     Temp Source Oral     SpO2 96 %     Weight 123 lb 14.4 oz (56.2 kg)     Height      Head Circumference      Peak Flow      Pain Score 6     Pain Loc      Pain Edu?      Excl. in GC?     Most recent vital signs: Vitals:   01/23/23 1415 01/23/23 1430  BP: (!) 100/51 (!) 101/57  Pulse: 95 89  Resp: (!) 24 16  Temp:    SpO2: 91% 93%    Constitutional: Somnolent but easily arousable. Eyes: Conjunctivae are normal. Head: Atraumatic. Nose: No congestion/rhinnorhea. Mouth/Throat: Mucous membranes are moist.  Cardiovascular: Tachycardic, regular rhythm. Grossly normal heart sounds.  2+ radial pulses bilaterally. Respiratory: Tachypneic with increased respiratory effort.  No retractions. Lung sounds diminished on right. Gastrointestinal: Soft and nontender. No distention. Musculoskeletal: No  lower extremity tenderness nor edema.  Neurologic:  Normal speech and language. No gross focal neurologic deficits are appreciated.    ED Results / Procedures / Treatments   Labs (all labs ordered are listed, but only abnormal results are displayed) Labs Reviewed  COMPREHENSIVE METABOLIC PANEL - Abnormal; Notable for the following components:      Result Value   BUN 29 (*)    Creatinine, Ser 1.10 (*)    Calcium 8.6 (*)    Total Protein 5.6 (*)    Albumin 1.9 (*)    AST 44 (*)    GFR, Estimated 50 (*)    All other components within normal limits  CBC WITH DIFFERENTIAL/PLATELET - Abnormal; Notable for the following components:   RBC 2.87 (*)    Hemoglobin 8.8 (*)    HCT 27.5 (*)    nRBC 1.6 (*)    Abs Immature Granulocytes 0.41 (*)    All other components within normal limits  URINALYSIS, ROUTINE W REFLEX MICROSCOPIC - Abnormal; Notable for the following components:   Color, Urine YELLOW (*)    APPearance HAZY (*)    Ketones, ur 5 (*)    Protein, ur 30 (*)    Bacteria, UA RARE (*)  All other components within normal limits  BLOOD GAS, VENOUS - Abnormal; Notable for the following components:   Bicarbonate 29.1 (*)    Acid-Base Excess 3.3 (*)    All other components within normal limits  CBC - Abnormal; Notable for the following components:   RBC 2.64 (*)    Hemoglobin 8.1 (*)    HCT 26.1 (*)    nRBC 0.8 (*)    All other components within normal limits  TROPONIN I (HIGH SENSITIVITY) - Abnormal; Notable for the following components:   Troponin I (High Sensitivity) 30 (*)    All other components within normal limits  TROPONIN I (HIGH SENSITIVITY) - Abnormal; Notable for the following components:   Troponin I (High Sensitivity) 31 (*)    All other components within normal limits  RESP PANEL BY RT-PCR (RSV, FLU A&B, COVID)  RVPGX2  CULTURE, BLOOD (ROUTINE X 2)  CULTURE, BLOOD (ROUTINE X 2)  LACTIC ACID, PLASMA  LACTIC ACID, PLASMA  PROTIME-INR  PROCALCITONIN   CREATININE, SERUM  CORTISOL  TSH  T3, FREE     EKG  ED ECG REPORT I, Chesley Noon, the attending physician, personally viewed and interpreted this ECG.   Date: 01/23/2023  EKG Time: 11:56  Rate: 123  Rhythm: sinus tachycardia  Axis: Normal  Intervals:right bundle branch block  ST&T Change: None  RADIOLOGY Chest x-ray reviewed and interpreted by me with right-sided infiltrate concerning for pneumonia.  PROCEDURES:  Critical Care performed: Yes, see critical care procedure note(s)  .Critical Care  Performed by: Chesley Noon, MD Authorized by: Chesley Noon, MD   Critical care provider statement:    Critical care time (minutes):  30   Critical care time was exclusive of:  Separately billable procedures and treating other patients and teaching time   Critical care was necessary to treat or prevent imminent or life-threatening deterioration of the following conditions:  Sepsis and shock   Critical care was time spent personally by me on the following activities:  Development of treatment plan with patient or surrogate, discussions with consultants, evaluation of patient's response to treatment, examination of patient, ordering and review of laboratory studies, ordering and review of radiographic studies, ordering and performing treatments and interventions, pulse oximetry, re-evaluation of patient's condition and review of old charts   I assumed direction of critical care for this patient from another provider in my specialty: no     Care discussed with: admitting provider      MEDICATIONS ORDERED IN ED: Medications  0.9 %  sodium chloride infusion (0 mLs Intravenous Stopped 01/23/23 1424)  norepinephrine (LEVOPHED) 4mg  in (0.016 mg/mL) premix infusion (2 mcg/min Intravenous New Bag/Given 01/23/23 1428)  docusate sodium (COLACE) capsule 100 mg (has no administration in time range)  polyethylene glycol (MIRALAX / GLYCOLAX) packet 17 g (has no administration in time  range)  enoxaparin (LOVENOX) injection 30 mg (has no administration in time range)  ondansetron (ZOFRAN) injection 4 mg (has no administration in time range)  albumin human 5 % solution 12.5 g (has no administration in time range)  ipratropium-albuterol (DUONEB) 0.5-2.5 (3) MG/3ML nebulizer solution 3 mL (has no administration in time range)  ipratropium-albuterol (DUONEB) 0.5-2.5 (3) MG/3ML nebulizer solution 3 mL (has no administration in time range)  sodium chloride 0.9 % bolus 1,000 mL (0 mLs Intravenous Stopped 01/23/23 1323)    And  sodium chloride 0.9 % bolus 500 mL (0 mLs Intravenous Stopped 01/23/23 1349)    And  sodium chloride 0.9 % bolus  250 mL (0 mLs Intravenous Stopped 01/23/23 1350)  ceFEPIme (MAXIPIME) 2 g in sodium chloride 0.9 % 100 mL IVPB (0 g Intravenous Stopped 01/23/23 1305)  metroNIDAZOLE (FLAGYL) IVPB 500 mg (0 mg Intravenous Stopped 01/23/23 1422)  vancomycin (VANCOREADY) IVPB 1250 mg/250 mL (1,250 mg Intravenous New Bag/Given 01/23/23 1321)  acetaminophen (TYLENOL) suppository 650 mg (650 mg Rectal Given 01/23/23 1244)  iohexol (OMNIPAQUE) 300 MG/ML solution 75 mL (75 mLs Intravenous Contrast Given 01/23/23 1520)     IMPRESSION / MDM / ASSESSMENT AND PLAN / ED COURSE  I reviewed the triage vital signs and the nursing notes.                              82 y.o. female with past medical history of hypertension, GERD, and metastatic breast cancer who presents to the ED complaining of increasing shortness of breath and weakness since last night after receiving Keytruda infusion yesterday.  Patient's presentation is most consistent with acute presentation with potential threat to life or bodily function.  Differential diagnosis includes, but is not limited to, sepsis, pneumonia, ACS, PE, CHF, COPD, UTI, AKI, electrolyte abnormality.  Patient ill-appearing, somnolent but easily arousable and with no focal neurologic deficits.  Vital signs show fever, tachycardia, and tachypnea  which are concerning for sepsis.  Patient started on broad-spectrum antibiotics and given 30 cc/kg IV fluid bolus given low blood pressure.  Chest x-ray concerning for pneumonia, which is likely the source of her sepsis.  Labs with renal function similar to previous, no acute electrolyte abnormality, LFTs show elevated bilirubin similar to previous.  No significant anemia or leukocytosis noted, troponin mildly elevated but stable on recheck.  Patient remains hypotensive with MAP less than 65 despite IV fluid resuscitation, will start on Levophed drip and case discussed with ICU team for admission.      FINAL CLINICAL IMPRESSION(S) / ED DIAGNOSES   Final diagnoses:  Sepsis with acute hypoxic respiratory failure and septic shock, due to unspecified organism [facility])  Pneumonia of right upper lobe due to infectious organism     Rx / DC Orders   ED Discharge Orders     None        Note:  This document was prepared using Dragon voice recognition software and may include unintentional dictation errors.   Chesley Noon, MD 01/23/23 [phone]

## 2023-01-23 NOTE — ED Notes (Signed)
MD at BS

## 2023-01-24 ENCOUNTER — Telehealth: Payer: Self-pay | Admitting: Internal Medicine

## 2023-01-24 ENCOUNTER — Inpatient Hospital Stay: Payer: Medicare Other

## 2023-01-24 DIAGNOSIS — A419 Sepsis, unspecified organism: Secondary | ICD-10-CM | POA: Diagnosis not present

## 2023-01-24 LAB — BODY FLUID CELL COUNT WITH DIFFERENTIAL
Eos, Fluid: 0 %
Lymphs, Fluid: 60 %
Monocyte-Macrophage-Serous Fluid: 12 %
Neutrophil Count, Fluid: 28 %
Other Cells, Fluid: 25 %
Total Nucleated Cell Count, Fluid: 33 cu mm

## 2023-01-24 LAB — BLOOD CULTURE ID PANEL (REFLEXED) - BCID2

## 2023-01-24 LAB — BASIC METABOLIC PANEL
Anion gap: 9 (ref 5–15)
BUN: 28 mg/dL — ABNORMAL HIGH (ref 8–23)
CO2: 23 mmol/L (ref 22–32)
Calcium: 7.5 mg/dL — ABNORMAL LOW (ref 8.9–10.3)
Chloride: 107 mmol/L (ref 98–111)
Creatinine, Ser: 0.72 mg/dL (ref 0.44–1.00)
GFR, Estimated: >60 mL/min (ref >60)
Glucose, Bld: 133 mg/dL — ABNORMAL HIGH (ref 70–99)
Potassium: 3.2 mmol/L — ABNORMAL LOW (ref 3.5–5.1)
Sodium: 139 mmol/L (ref 135–145)

## 2023-01-24 LAB — HEPATIC FUNCTION PANEL
ALT: 21 U/L (ref 0–44)
AST: 41 U/L (ref 15–41)
Albumin: 2 g/dL — ABNORMAL LOW (ref 3.5–5.0)
Alkaline Phosphatase: 50 U/L (ref 38–126)
Bilirubin, Direct: 0.2 mg/dL (ref 0.0–0.2)
Indirect Bilirubin: 1 mg/dL — ABNORMAL HIGH (ref 0.3–0.9)
Total Bilirubin: 1.2 mg/dL (ref 0.3–1.2)
Total Protein: 5.2 g/dL — ABNORMAL LOW (ref 6.5–8.1)

## 2023-01-24 LAB — CBC
HCT: 24.6 % — ABNORMAL LOW (ref 36.0–46.0)
Hemoglobin: 7.9 g/dL — ABNORMAL LOW (ref 12.0–15.0)
MCH: 30.9 pg (ref 26.0–34.0)
MCHC: 32.1 g/dL (ref 30.0–36.0)
MCV: 96.1 fL (ref 80.0–100.0)
Platelets: 298 10*3/uL (ref 150–400)
RBC: 2.56 MIL/uL — ABNORMAL LOW (ref 3.87–5.11)
RDW: 14.8 % (ref 11.5–15.5)
WBC: 5.4 10*3/uL (ref 4.0–10.5)
nRBC: 0.4 % — ABNORMAL HIGH (ref 0.0–0.2)

## 2023-01-24 LAB — CULTURE, BLOOD (ROUTINE X 2): Special Requests: ADEQUATE

## 2023-01-24 LAB — LACTATE DEHYDROGENASE, PLEURAL OR PERITONEAL FLUID: LD, Fluid: 145 U/L — ABNORMAL HIGH (ref 3–23)

## 2023-01-24 LAB — STREP PNEUMONIAE URINARY ANTIGEN: Strep Pneumo Urinary Antigen: NEGATIVE

## 2023-01-24 LAB — GLUCOSE, PLEURAL OR PERITONEAL FLUID: Glucose, Fluid: 133 mg/dL

## 2023-01-24 LAB — MAGNESIUM: Magnesium: 1.9 mg/dL (ref 1.7–2.4)

## 2023-01-24 LAB — PROTEIN, PLEURAL OR PERITONEAL FLUID: Total protein, fluid: <3 g/dL

## 2023-01-24 LAB — LACTATE DEHYDROGENASE: LDH: 244 U/L — ABNORMAL HIGH (ref 98–192)

## 2023-01-24 LAB — PATHOLOGIST SMEAR REVIEW

## 2023-01-24 LAB — CORTISOL: Cortisol, Plasma: 81.1 ug/dL

## 2023-01-24 LAB — PHOSPHORUS: Phosphorus: 4.2 mg/dL (ref 2.5–4.6)

## 2023-01-24 MED ORDER — ADULT MULTIVITAMIN W/MINERALS CH
1.0000 | ORAL_TABLET | Freq: Every day | ORAL | Status: DC
  Administered 2023-01-26 – 2023-02-07 (×12): 1 via ORAL
  Filled 2023-01-24 (×12): qty 1

## 2023-01-24 MED ORDER — SODIUM CHLORIDE 0.9 % IV SOLN
2.0000 g | Freq: Two times a day (BID) | INTRAVENOUS | Status: DC
  Administered 2023-01-24 – 2023-01-30 (×13): 2 g via INTRAVENOUS
  Filled 2023-01-24: qty 2
  Filled 2023-01-24 (×3): qty 12.5
  Filled 2023-01-24: qty 2
  Filled 2023-01-24 (×8): qty 12.5
  Filled 2023-01-24 (×2): qty 2
  Filled 2023-01-24: qty 12.5

## 2023-01-24 MED ORDER — VANCOMYCIN HCL 1250 MG/250ML IV SOLN: 1250.0000 mg | INTRAVENOUS | Status: DC

## 2023-01-24 MED ORDER — IPRATROPIUM-ALBUTEROL 0.5-2.5 (3) MG/3ML IN SOLN
3.0000 mL | Freq: Four times a day (QID) | RESPIRATORY_TRACT | Status: DC
  Administered 2023-01-24 – 2023-01-26 (×10): 3 mL via RESPIRATORY_TRACT
  Filled 2023-01-24 (×10): qty 3

## 2023-01-24 MED ORDER — VITAMIN C 500 MG PO TABS
500.0000 mg | ORAL_TABLET | Freq: Two times a day (BID) | ORAL | Status: DC
  Administered 2023-01-24 – 2023-02-07 (×24): 500 mg via ORAL
  Filled 2023-01-24 (×27): qty 1

## 2023-01-24 MED ORDER — LIDOCAINE HCL (PF) 1 % IJ SOLN
10.0000 mL | Freq: Once | INTRAMUSCULAR | Status: AC
  Administered 2023-01-24: 10 mL via INTRADERMAL
  Filled 2023-01-24: qty 10

## 2023-01-24 MED ORDER — ENOXAPARIN SODIUM 40 MG/0.4ML IJ SOSY
40.0000 mg | PREFILLED_SYRINGE | INTRAMUSCULAR | Status: DC
  Administered 2023-01-24: 40 mg via SUBCUTANEOUS
  Filled 2023-01-24: qty 0.4

## 2023-01-24 MED ORDER — ENSURE ENLIVE PO LIQD
237.0000 mL | Freq: Three times a day (TID) | ORAL | Status: DC
  Administered 2023-01-27 – 2023-01-28 (×4): 237 mL via ORAL

## 2023-01-24 MED ORDER — VANCOMYCIN HCL 750 MG/150ML IV SOLN
750.0000 mg | INTRAVENOUS | Status: DC
  Administered 2023-01-24 – 2023-01-25 (×2): 750 mg via INTRAVENOUS
  Filled 2023-01-24 (×3): qty 150

## 2023-01-24 NOTE — Progress Notes (Signed)
Pharmacy Antibiotic Note  Crystal Mcmahon is a 82 y.o. female admitted on 01/23/2023 with sepsis.  Pharmacy has been consulted for Vancomycin dosing.  Plan: Vancomycin 1250 mg IV X 1 given on 5/7 @ 1321. Vancomycin 1250 mg IV Q48H ordered to start on 5/9 2 1300.   AUC = 486.9 Vanc trough = 9.0  Height: 5' (152.4 cm) Weight: 56.2 kg (123 lb 14.4 oz) IBW/kg (Calculated) : 45.5  Temp (24hrs), Avg:99.4 F (37.4 C), Min:98 F (36.7 C), Max:101.1 F (38.4 C)  Recent Labs  Lab 01/19/23 1502 01/23/23 1212 01/23/23 1443  WBC 7.7 6.3 6.1  CREATININE 1.14* 1.10* 0.93  LATICACIDVEN  --  1.9 1.0    Estimated Creatinine Clearance: 36 mL/min (by C-G formula based on SCr of 0.93 mg/dL).    Allergies  Allergen Reactions   Acetaminophen-Pamabrom Rash and Swelling    Other reaction(s): Other (See Comments)  Other Reaction: RASH & SWELLING   Codeine Nausea And Vomiting   Midol Pm [Diphenhydramine-Apap (Sleep)] Swelling and Rash         Antimicrobials this admission:   >>    >>   Dose adjustments this admission:   Microbiology results:  BCx:   UCx:    Sputum:    MRSA PCR:   Thank you for allowing pharmacy to be a part of this patient's care.  Chaundra Abreu D 01/24/2023 1:16 AM

## 2023-01-24 NOTE — Progress Notes (Signed)
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  She presented to [facility] ER on 05/7 with fevers, tachycardia, and hypoxia.  At baseline pt wears 2L O2 via nasal canula.  She has been followed by oncology and received her first dose of keytruda on 01/22/23.  She was recently taken off her antihypertensives due to hypotension and s/p dex taper.    Upon arrival to the ER pts O2 sats were 84% on baseline O2@2L  via nasal canula requiring increase in O2 to 4L with improvement in oxygenation.  CXR concerning for pneumonia with trace pleural effusions and unchanged left apical mass.  Sepsis protocol initiated and pt received 1,750 ml iv fluid bolus, metronidazole, vancomycin, and cefepime.  Pt remained hypotensive requiring low dose levophed gtt.  PCCM team contacted for ICU admission.  Pt was off pressor and transferred to hospitalist service on 01/24/23.  #Septic and hypovolemic shock  --off pressor --cont stress dose steroid  #Mildly elevated troponin likely secondary to demand ischemia in the setting of sepsis  Hx: HTN   --BP low on presentation.   #Mild AKI secondary to ATN in the setting of hypotension  - Trend BMP  - Replace electrolytes as indicated  - Monitor UOP  - Avoid nephrotoxic medications    #Sepsis secondary to pneumonia  --cont vanc and cefepime   #Iron deficiency anemia  - Trend CBC - Monitor for s/sx of bleeding - Transfuse for hgb <7   #Acute on chronic hypoxic respiratory failure  Chronic O2 @2L  O2 and Current Everyday Smoker --2/2 PNA in the setting of lung cancer --Continue supplemental O2 to keep sats between 88-92%, wean as tolerated  #Right pleural effusion --thoracentesis today, with 330 ml  removed --fluid studies consistent with parapneumonic effusion  --pulm following  #Stage IV lung cancer with mets to the bone --oncology consulted    DVT prophylaxis: Lovenox SQ Code Status: DNR  Family Communication: family updated at bedside today Level of care: Stepdown Dispo:   The patient is from: home Anticipated d/c is to: home Anticipated d/c date is: >3 days   Subjective and Interval History:  Pt denied dyspnea.     Objective: Vitals:   01/24/23 1500 01/24/23 1600 01/24/23 1700 01/24/23 1800  BP: (!) 114/55 (!) 128/51 139/74 139/65  Pulse: 89 95 92 89  Resp: 17 16 17 17   Temp:  (!) 97.1 F (36.2 C)    TempSrc:  Axillary    SpO2: 95% 94% 95% 96%  Weight:      Height:        Intake/Output Summary (Last 24 hours) at 01/24/2023 1908 Last data filed at 01/24/2023 1800 Gross per 24 hour  Intake 168.19 ml  Output 200 ml  Net -31.81 ml   Filed Weights   01/23/23 1149 01/24/23 0500  Weight: 56.2 kg 58.2 kg    Examination:   Constitutional: NAD, AAOx3 HEENT: conjunctivae and lids normal, EOMI CV: No cyanosis.   RESP: normal respiratory effort Neuro: II - XII grossly intact.     Data Reviewed: I have personally reviewed labs and imaging studies  Time spent: 50 minutes  Darlin Priestly, MD [facility] If 7PM-7AM, please contact night-coverage 01/24/2023, 7:08  PM

## 2023-01-24 NOTE — Progress Notes (Signed)
2136: pt transferred from ICU 09 to 1C 111, on 4L O2 via [place], fully monitored, accompanied by ICU RN and ICU CNA via bed. Skin verification completed with ICU RN and 1C RN. Pt belongings transferred with pt, at the bedside. ICU RN attempt to call family to notify of pt transfer; unsuccessful.

## 2023-01-24 NOTE — Telephone Encounter (Signed)
I checked on pt; currently admitted to ICU for severe sepsis/right upper lobe pneumonia. Appreciate he management offered by primary/ critical care team. Discussed with sister/ and pt. Discussed with Dr.Lai. contiune current scope of care. GB No charges.

## 2023-01-24 NOTE — Progress Notes (Signed)
PHARMACY - PHYSICIAN COMMUNICATION CRITICAL VALUE ALERT - BLOOD CULTURE IDENTIFICATION (BCID)  Crystal Mcmahon is an 82 y.o. female who presented to [facility] on 01/23/2023 with a chief complaint of sepsis  Assessment:  Staph epi in 1 of 4 bottles, no resistance detected.  Most likely a contaminant.  (include suspected source if known)  Name of physician (or Provider) Contacted: Cheryll Cockayne Rust-Chester, NP  Current antibiotics:  Vanc, Cefepime   Changes to prescribed antibiotics recommended:  Patient is on recommended antibiotics - No changes needed  Results for orders placed or performed during the hospital encounter of 01/23/23  Blood Culture ID Panel (Reflexed) (Collected: 01/23/2023 12:12 PM)  Result Value Ref Range   Enterococcus faecalis NOT DETECTED NOT DETECTED   Enterococcus Faecium NOT DETECTED NOT DETECTED   Listeria monocytogenes NOT DETECTED NOT DETECTED   Staphylococcus species DETECTED (A) NOT DETECTED   Staphylococcus aureus (BCID) NOT DETECTED NOT DETECTED   Staphylococcus epidermidis DETECTED (A) NOT DETECTED   Staphylococcus lugdunensis NOT DETECTED NOT DETECTED   Streptococcus species NOT DETECTED NOT DETECTED   Streptococcus agalactiae NOT DETECTED NOT DETECTED   Streptococcus pneumoniae NOT DETECTED NOT DETECTED   Streptococcus pyogenes NOT DETECTED NOT DETECTED   A.calcoaceticus-baumannii NOT DETECTED NOT DETECTED   Bacteroides fragilis NOT DETECTED NOT DETECTED   Enterobacterales NOT DETECTED NOT DETECTED   Enterobacter cloacae complex NOT DETECTED NOT DETECTED   Escherichia coli NOT DETECTED NOT DETECTED   Klebsiella aerogenes NOT DETECTED NOT DETECTED   Klebsiella oxytoca NOT DETECTED NOT DETECTED   Klebsiella pneumoniae NOT DETECTED NOT DETECTED   Proteus species NOT DETECTED NOT DETECTED   Salmonella species NOT DETECTED NOT DETECTED   Serratia marcescens NOT DETECTED NOT DETECTED   Haemophilus influenzae NOT DETECTED NOT DETECTED   Neisseria  meningitidis NOT DETECTED NOT DETECTED   Pseudomonas aeruginosa NOT DETECTED NOT DETECTED   Stenotrophomonas maltophilia NOT DETECTED NOT DETECTED   Candida albicans NOT DETECTED NOT DETECTED   Candida auris NOT DETECTED NOT DETECTED   Candida glabrata NOT DETECTED NOT DETECTED   Candida krusei NOT DETECTED NOT DETECTED   Candida parapsilosis NOT DETECTED NOT DETECTED   Candida tropicalis NOT DETECTED NOT DETECTED   Cryptococcus neoformans/gattii NOT DETECTED NOT DETECTED   Methicillin resistance mecA/C NOT DETECTED NOT DETECTED    Tawnya Pujol D 01/24/2023  6:16 AM

## 2023-01-24 NOTE — Progress Notes (Signed)
eLink Physician-Brief Progress Note Patient Name: Crystal Mcmahon DOB: January 09, 1941 MRN: [id number]   Date of Service  01/24/2023  HPI/Events of Note  82 year old female with a history of stage IV lung cancer status post radiation to the neck on pembrolizumab who presents to the hospital with weakness, fevers, and cough and shock thought to be septic versus hypovolemic.  She received aggressive IV hydration but required norepinephrine and was admitted to the ICU for further management.  Remains somewhat hypotensive and hypoxic requiring 4 L of oxygen.  Initial labs show significant normocytic anemia with no other notable findings.  CT chest with bilateral upper lobe opacities, right greater than left with bilateral effusions, progression of underlying lymphadenopathy, and new nodules concerning for metastatic disease.  eICU Interventions  Received appropriate antibiotics with vancomycin and cefepime.  Will repeat doses antibiotics.  Hold the Flagyl.  Agree with stress dose steroids  No indication for GI prophylaxis, DVT prophylaxis with enoxaparin.  DNR Noted  Norepinephrine as needed to maintain MAP greater than 65.     Intervention Category Evaluation Type: New Patient Evaluation  Crystal Mcmahon 01/24/2023, 12:38 AM

## 2023-01-24 NOTE — Progress Notes (Signed)
Pharmacy Antibiotic Note  Crystal Mcmahon is a 82 y.o. female admitted on 01/23/2023 with sepsis.  Pharmacy has been consulted for Vancomycin dosing.  Plan: Vancomycin 750 mg IV Q 24 hrs. Goal AUC 400-550. Expected AUC: 494.9 Expected Cmin: 13.4 SCr used: 0.8(actual 0.72)  Cefepime 2g Q12 hours  Height: 5' (152.4 cm) Weight: 58.2 kg (128 lb 4.9 oz) IBW/kg (Calculated) : 45.5  Temp (24hrs), Avg:97.6 F (36.4 C), Min:96.3 F (35.7 C), Max:99.2 F (37.3 C)  Recent Labs  Lab 01/19/23 1502 01/23/23 1212 01/23/23 1443 01/24/23 0502  WBC 7.7 6.3 6.1 5.4  CREATININE 1.14* 1.10* 0.93 0.72  LATICACIDVEN  --  1.9 1.0  --      Estimated Creatinine Clearance: 42.6 mL/min (by C-G formula based on SCr of 0.72 mg/dL).    Allergies  Allergen Reactions   Acetaminophen-Pamabrom Rash and Swelling    Other reaction(s): Other (See Comments)  Other Reaction: RASH & SWELLING   Codeine Nausea And Vomiting   Midol Pm [Diphenhydramine-Apap (Sleep)] Swelling and Rash         Antimicrobials this admission: Vancomycin 5/7  >>  Cefepime 5/7 >>   Dose adjustments this admission:   Microbiology results:  BCx: staph epi(likely contaminant)  MRSA PCR: negative  Thank you for allowing pharmacy to be a part of this patient's care.  Havanah Nelms A Jelani Trueba 01/24/2023 1:44 PM

## 2023-01-25 ENCOUNTER — Inpatient Hospital Stay: Payer: Medicare Other | Admitting: General Practice

## 2023-01-25 ENCOUNTER — Encounter
Admission: EM | Disposition: A | Discharge status: Skilled Nursing Facility | Payer: Self-pay | Source: Home / Self Care | Attending: Internal Medicine

## 2023-01-25 ENCOUNTER — Inpatient Hospital Stay: Payer: Medicare Other

## 2023-01-25 ENCOUNTER — Encounter: Payer: Self-pay | Admitting: Student in an Organized Health Care Education/Training Program

## 2023-01-25 DIAGNOSIS — A419 Sepsis, unspecified organism: Secondary | ICD-10-CM

## 2023-01-25 DIAGNOSIS — E440 Moderate protein-calorie malnutrition: Secondary | ICD-10-CM | POA: Insufficient documentation

## 2023-01-25 DIAGNOSIS — R6521 Severe sepsis with septic shock: Secondary | ICD-10-CM

## 2023-01-25 DIAGNOSIS — K922 Gastrointestinal hemorrhage, unspecified: Secondary | ICD-10-CM | POA: Diagnosis not present

## 2023-01-25 DIAGNOSIS — R000 Tachycardia, unspecified: Secondary | ICD-10-CM

## 2023-01-25 DIAGNOSIS — L8990 Pressure ulcer of unspecified site, unspecified stage: Secondary | ICD-10-CM | POA: Insufficient documentation

## 2023-01-25 DIAGNOSIS — J9601 Acute respiratory failure with hypoxia: Secondary | ICD-10-CM | POA: Diagnosis not present

## 2023-01-25 DIAGNOSIS — Z515 Encounter for palliative care: Secondary | ICD-10-CM

## 2023-01-25 LAB — COMPREHENSIVE METABOLIC PANEL
ALT: 19 U/L (ref 0–44)
AST: 46 U/L — ABNORMAL HIGH (ref 15–41)
Albumin: 1.8 g/dL — ABNORMAL LOW (ref 3.5–5.0)
Alkaline Phosphatase: 49 U/L (ref 38–126)
Anion gap: 13 (ref 5–15)
BUN: 33 mg/dL — ABNORMAL HIGH (ref 8–23)
CO2: 19 mmol/L — ABNORMAL LOW (ref 22–32)
Calcium: 7.4 mg/dL — ABNORMAL LOW (ref 8.9–10.3)
Chloride: 110 mmol/L (ref 98–111)
Creatinine, Ser: 0.84 mg/dL (ref 0.44–1.00)
GFR, Estimated: >60 mL/min (ref >60)
Glucose, Bld: 160 mg/dL — ABNORMAL HIGH (ref 70–99)
Potassium: 2.8 mmol/L — ABNORMAL LOW (ref 3.5–5.1)
Sodium: 142 mmol/L (ref 135–145)
Total Bilirubin: 0.9 mg/dL (ref 0.3–1.2)
Total Protein: 4.8 g/dL — ABNORMAL LOW (ref 6.5–8.1)

## 2023-01-25 LAB — CBC
HCT: 21.8 % — ABNORMAL LOW (ref 36.0–46.0)
HCT: 27.9 % — ABNORMAL LOW (ref 36.0–46.0)
Hemoglobin: 6.8 g/dL — ABNORMAL LOW (ref 12.0–15.0)
Hemoglobin: 9.7 g/dL — ABNORMAL LOW (ref 12.0–15.0)
MCH: 30.2 pg (ref 26.0–34.0)
MCH: 30.8 pg (ref 26.0–34.0)
MCHC: 31.2 g/dL (ref 30.0–36.0)
MCHC: 34.8 g/dL (ref 30.0–36.0)
MCV: 96.9 fL (ref 80.0–100.0)
MCV: 88.6 fL (ref 80.0–100.0)
Platelets: 328 10*3/uL (ref 150–400)
Platelets: 183 10*3/uL (ref 150–400)
RBC: 2.25 MIL/uL — ABNORMAL LOW (ref 3.87–5.11)
RBC: 3.15 MIL/uL — ABNORMAL LOW (ref 3.87–5.11)
RDW: 14.8 % (ref 11.5–15.5)
RDW: 14.4 % (ref 11.5–15.5)
WBC: 8.7 10*3/uL (ref 4.0–10.5)
WBC: 7.8 10*3/uL (ref 4.0–10.5)
nRBC: 0.8 % — ABNORMAL HIGH (ref 0.0–0.2)
nRBC: 1.4 % — ABNORMAL HIGH (ref 0.0–0.2)

## 2023-01-25 LAB — TYPE AND SCREEN: ABO/RH(D): B POS

## 2023-01-25 LAB — BPAM RBC
Blood Product Expiration Date: 202405182359
ISSUE DATE / TIME: 202405091312
Unit Type and Rh: 7300

## 2023-01-25 LAB — CULTURE, BLOOD (ROUTINE X 2)
Culture: NO GROWTH
Special Requests: ADEQUATE

## 2023-01-25 LAB — CBC WITH DIFFERENTIAL/PLATELET
Abs Immature Granulocytes: 0.33 10*3/uL — ABNORMAL HIGH (ref 0.00–0.07)
Basophils Absolute: 0 10*3/uL (ref 0.0–0.1)
Basophils Relative: 0 %
Eosinophils Absolute: 0 10*3/uL (ref 0.0–0.5)
Eosinophils Relative: 0 %
HCT: 21 % — ABNORMAL LOW (ref 36.0–46.0)
Hemoglobin: 6.7 g/dL — ABNORMAL LOW (ref 12.0–15.0)
Immature Granulocytes: 4 %
Lymphocytes Relative: 9 %
Lymphs Abs: 0.7 10*3/uL (ref 0.7–4.0)
MCH: 30.3 pg (ref 26.0–34.0)
MCHC: 31.9 g/dL (ref 30.0–36.0)
MCV: 95 fL (ref 80.0–100.0)
Monocytes Absolute: 0.2 10*3/uL (ref 0.1–1.0)
Monocytes Relative: 3 %
Neutro Abs: 6.3 10*3/uL (ref 1.7–7.7)
Neutrophils Relative %: 84 %
Platelets: 308 10*3/uL (ref 150–400)
RBC: 2.21 MIL/uL — ABNORMAL LOW (ref 3.87–5.11)
RDW: 14.7 % (ref 11.5–15.5)
Smear Review: NORMAL
WBC: 7.5 10*3/uL (ref 4.0–10.5)
nRBC: 0.8 % — ABNORMAL HIGH (ref 0.0–0.2)

## 2023-01-25 LAB — PREPARE RBC (CROSSMATCH)

## 2023-01-25 LAB — BLOOD GAS, VENOUS
Acid-base deficit: 3 mmol/L — ABNORMAL HIGH (ref 0.0–2.0)
Bicarbonate: 21.1 mmol/L (ref 20.0–28.0)
O2 Saturation: 34.3 %
Patient temperature: 37
pCO2, Ven: 34 mmHg — ABNORMAL LOW (ref 44–60)
pH, Ven: 7.4 (ref 7.25–7.43)
pO2, Ven: <31 mmHg — CL (ref 32–45)

## 2023-01-25 LAB — MAGNESIUM: Magnesium: 2.2 mg/dL (ref 1.7–2.4)

## 2023-01-25 LAB — LEGIONELLA PNEUMOPHILA SEROGP 1 UR AG: L. pneumophila Serogp 1 Ur Ag: NEGATIVE

## 2023-01-25 LAB — GLUCOSE, CAPILLARY
Glucose-Capillary: 151 mg/dL — ABNORMAL HIGH (ref 70–99)
Glucose-Capillary: 138 mg/dL — ABNORMAL HIGH (ref 70–99)

## 2023-01-25 LAB — LACTIC ACID, PLASMA
Lactic Acid, Venous: 3.9 mmol/L (ref 0.5–1.9)
Lactic Acid, Venous: 2.4 mmol/L (ref 0.5–1.9)

## 2023-01-25 LAB — ABO/RH: ABO/RH(D): B POS

## 2023-01-25 SURGERY — ESOPHAGOGASTRODUODENOSCOPY (EGD) WITH PROPOFOL
Anesthesia: General

## 2023-01-25 MED ORDER — SODIUM CHLORIDE 0.9% IV SOLUTION: Freq: Once | INTRAVENOUS | Status: DC

## 2023-01-25 MED ORDER — PROPOFOL 10 MG/ML IV BOLUS
INTRAVENOUS | Status: DC | PRN
  Administered 2023-01-25: 20 mg via INTRAVENOUS
  Administered 2023-01-25: 30 mg via INTRAVENOUS

## 2023-01-25 MED ORDER — SODIUM CHLORIDE 0.9 % IV SOLN
250.0000 mg | INTRAVENOUS | Status: AC
  Administered 2023-01-25: 250 mg via INTRAVENOUS
  Filled 2023-01-25: qty 5

## 2023-01-25 MED ORDER — PANTOPRAZOLE SODIUM 40 MG IV SOLR
40.0000 mg | Freq: Two times a day (BID) | INTRAVENOUS | Status: DC
  Administered 2023-01-25 – 2023-02-05 (×23): 40 mg via INTRAVENOUS
  Filled 2023-01-25 (×23): qty 10

## 2023-01-25 MED ORDER — PROPOFOL 1000 MG/100ML IV EMUL
INTRAVENOUS | Status: AC
  Filled 2023-01-25: qty 100

## 2023-01-25 MED ORDER — LIDOCAINE HCL (CARDIAC) PF 100 MG/5ML IV SOSY
PREFILLED_SYRINGE | INTRAVENOUS | Status: DC | PRN
  Administered 2023-01-25: 100 mg via INTRAVENOUS

## 2023-01-25 MED ORDER — SODIUM CHLORIDE 0.9 % IV SOLN: INTRAVENOUS | Status: DC

## 2023-01-25 MED ORDER — LACTATED RINGERS IV BOLUS
250.0000 mL | Freq: Once | INTRAVENOUS | Status: AC
  Administered 2023-01-25: 250 mL via INTRAVENOUS

## 2023-01-25 MED ORDER — POTASSIUM CHLORIDE 10 MEQ/100ML IV SOLN
10.0000 meq | INTRAVENOUS | Status: AC
  Administered 2023-01-25 (×4): 10 meq via INTRAVENOUS
  Filled 2023-01-25 (×4): qty 100

## 2023-01-25 MED ORDER — POTASSIUM CHLORIDE 10 MEQ/100ML IV SOLN
10.0000 meq | INTRAVENOUS | Status: DC
  Administered 2023-01-25: 10 meq via INTRAVENOUS
  Filled 2023-01-25: qty 100

## 2023-01-25 MED ORDER — MIDODRINE HCL 5 MG PO TABS
10.0000 mg | ORAL_TABLET | Freq: Three times a day (TID) | ORAL | Status: DC
  Administered 2023-01-26: 10 mg via ORAL
  Filled 2023-01-25: qty 2

## 2023-01-25 MED ORDER — PROPOFOL 500 MG/50ML IV EMUL
INTRAVENOUS | Status: DC | PRN
  Administered 2023-01-25: 130 ug/kg/min via INTRAVENOUS

## 2023-01-25 MED ORDER — POTASSIUM CHLORIDE 10 MEQ/100ML IV SOLN: 10.0000 meq | INTRAVENOUS | Status: DC

## 2023-01-25 MED ORDER — SODIUM CHLORIDE 0.9 % IV SOLN
INTRAVENOUS | Status: AC
  Filled 2023-01-25: qty 1000

## 2023-01-25 MED ORDER — SODIUM CHLORIDE 0.9% IV SOLUTION: Freq: Once | INTRAVENOUS | Status: AC

## 2023-01-25 MED ORDER — EPINEPHRINE (ANAPHYLAXIS) 30 MG/30ML IJ SOLN
INTRAMUSCULAR | Status: DC | PRN
  Administered 2023-01-25: 3 mg via SUBCUTANEOUS

## 2023-01-25 MED ORDER — EPINEPHRINE 1 MG/10ML IJ SOSY
PREFILLED_SYRINGE | INTRAMUSCULAR | Status: AC
  Filled 2023-01-25: qty 10

## 2023-01-25 NOTE — Progress Notes (Signed)
Initial Nutrition Assessment  DOCUMENTATION CODES:   Non-severe (moderate) malnutrition in context of chronic illness  INTERVENTION:   -RD will follow for diet advancement and add supplements as appropriate   NUTRITION DIAGNOSIS:   Moderate Malnutrition related to chronic illness (stage IV lung cancer with mets to bone) as evidenced by mild fat depletion, moderate fat depletion, mild muscle depletion, moderate muscle depletion.  GOAL:   Patient will meet greater than or equal to 90% of their needs  MONITOR:   PO intake, Supplement acceptance, Diet advancement  REASON FOR ASSESSMENT:   Consult Assessment of nutrition requirement/status  ASSESSMENT:   82 y/o female with h/o thyroid cancer s/p rt hemithyroidectomy 03/2018, stage IV lung cancer with bone mets s/p chemo/radiation, breast cancer s/p lumpectomy/radiation, stroke, HTN, GERD, IDA and anxiety who is admited with PNA, sepsis and AKI.  Pt admitted with pneumonia, sepsis, and AKI.   Reviewed I/O's: +82 ml x 24 hours and +2.7 L since admission  UOP: 200 ml x 24 hours  Pt has been having coffee ground emesis this morning. She is currently NPO and awaiting GI consult. Noted meal completions 0%.   Pt sleepy at time of visit. She did not engage much with this RD, but raised her hand when RD called her name and asked questions. History obtained from pt cousin at bedside, who is very involved in her care. She states that pt has been doing very well with oral intake until over the past 2 weeks, after she finished her radiation treatments. Per cousin, radiation treatments caused throat discomfort, which made eating very painful and pt was unable to swallow secondary to pain and discomfort. Even liquids were difficult to consume at this time.   No wt loss noted over the past month. Per cousin, UBW is around 125# and pt has always been small framed.   Palliative care consult pending.   Medications reviewed and include vitamin C  and solu-medrol.   Labs reviewed: K: 2.8, CBGS: 151.   NUTRITION - FOCUSED PHYSICAL EXAM:  Flowsheet Row Most Recent Value  Orbital Region Mild depletion  Upper Arm Region Moderate depletion  Thoracic and Lumbar Region No depletion  Buccal Region Mild depletion  Temple Region Mild depletion  Clavicle Bone Region No depletion  Clavicle and Acromion Bone Region No depletion  Scapular Bone Region No depletion  Dorsal Hand Mild depletion  Patellar Region Moderate depletion  Anterior Thigh Region Moderate depletion  Posterior Calf Region Moderate depletion  Edema (RD Assessment) Mild  Hair Reviewed  Eyes Reviewed  Mouth Reviewed  Skin Reviewed  Nails Reviewed       Diet Order:   Diet Order             Diet NPO time specified  Diet effective now                   EDUCATION NEEDS:   No education needs have been identified at this time  Skin:  Skin Assessment: Skin Integrity Issues: Skin Integrity Issues:: Stage II Stage II: lt arm, rt elbow  Last BM:  PTA  Height:   Ht Readings from Last 1 Encounters:  01/25/23 5' (1.524 m)    Weight:   Wt Readings from Last 1 Encounters:  01/25/23 59 kg    Ideal Body Weight:  45.5 kg  BMI:  Body mass index is 25.4 kg/m.  Estimated Nutritional Needs:   Kcal:  1550-1750  Protein:  75-90 grams  Fluid:  > 1.5  Ricka Burdock, RD, LDN, [place] Registered Dietitian II Certified Diabetes Care and Education Specialist Please refer to [facility] for RD and/or RD on-call/weekend/after hours pager

## 2023-01-25 NOTE — Progress Notes (Signed)
       CROSS COVER NOTE  NAME: Crystal Mcmahon MRN: [id number] DOB : [birth year]    HPI/Events of Note   Report:tachycardia then patient had hematoemesis and bowel movement with blood clotsthen patient became restless and slightly hypotensive On review of chart:metastatic stage 4 lung cancer admitted with tachycardia and hypoxia 5/7 initially requiring vasopressors now on dexamethasone taper and s/p throacentesis for right pleural effusion . Known iron deficiency anemia    Assessment and  Interventions   Assessment: Calm  Abdomen soft but some pain behaviors with palpation Stool on diaper appears brown Sats stable on 4 L [place] no resp distress Dg chest  IMPRESSION: Biapical pneumonia that is worse on the right, stable from prior.   Plan: 250 ml LR bolus Stat cmp K 2.8, albumin 1.8, AST 46, vbg  and cbc hgb 6.8, lactic 3.9 Transfuse 1 unit PRBCs - consent obtained from patient 8 runs 10 MeQ KCL Protonix 40 IV BID CT abdomen after BP and heart rate improve May need GI consult       Donnie Mesa NP [facility]

## 2023-01-25 NOTE — Progress Notes (Addendum)
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  She presented to [facility] ER on 05/7 with fevers, tachycardia, and hypoxia.  At baseline pt wears 2L O2 via nasal canula.  She has been followed by oncology and received her first dose of keytruda on 01/22/23.  She was recently taken off her antihypertensives due to hypotension and s/p dex taper.    Upon arrival to the ER pts O2 sats were 84% on baseline O2@2L  via nasal canula requiring increase in O2 to 4L with improvement in oxygenation.  CXR concerning for pneumonia with trace pleural effusions and unchanged left apical mass.  Sepsis protocol initiated and pt received 1,750 ml iv fluid bolus, metronidazole, vancomycin, and cefepime.  Pt remained hypotensive requiring low dose levophed gtt.  PCCM team contacted for ICU admission.  Pt was off pressor and transferred to hospitalist service on 01/24/23.  #Septic and hypovolemic shock  --off pressor --cont stress dose steroid  #Mildly elevated troponin likely secondary to demand ischemia in the setting of sepsis  Hypotension --BP low on presentation. --start midodrine 10 mg TID --cont MIVF   #Mild AKI secondary to ATN in the setting of hypotension  - Trend BMP  - Replace electrolytes as indicated  - Monitor UOP  - Avoid nephrotoxic medications    #Sepsis secondary to pneumonia  --cont vanc and cefepime   # acute blood loss anemia #Iron deficiency anemia  --coffee ground emesis and large melanotic stool today --Hgb dropped from 8.8 on presentation to 6.7 today --2u pRBC today  # Upper GI bleed --coffee ground emesis and large melanotic stool today --EGD today found large ulcer just past the pylorus in the duodenum.  Another ulcer in the pylorus. The culprit lesion was the duodenal ulcer which was treated.  --cont IV PPI BID --if bleeding continues, will need vascular embolization.    #Acute on chronic hypoxic respiratory failure  Chronic O2 @2L  O2 and Current Everyday Smoker --2/2 PNA in the setting of lung cancer --Continue supplemental O2 to keep sats between 88-92%, wean as tolerated  #Right pleural effusion --thoracentesis on 5/8, with 330 ml removed --fluid studies consistent with parapneumonic effusion  --pulm following  #Stage IV lung cancer with mets to the bone --onc palliative consult  Hypokalemia --monitor and replete with IV potassim    DVT prophylaxis: Lovenox SQ Code Status: DNR  Family Communication: family updated at bedside today Level of care: Stepdown Dispo:   The patient is from: home Anticipated d/c is to: home Anticipated d/c date is: >3 days   Subjective and Interval History:  Overnight, pt started having coffee ground emesis.  This morning, pt had large melanotic BM and stool with blood clots.  Pt received IVF and pRBC, and transferred to stepdown in case she needs pressors.  GI took pt for EGD, found large ulcer just past the pylorus in the duodenum. Another ulcer in the pylorus. The culprit lesion was the duodenal ulcer which was treated.    Objective: Vitals:   01/25/23 1730 01/25/23 1743 01/25/23 1937 01/25/23 2000  BP: (!) 143/71     Pulse:  95    Resp:  (!) 23    Temp:  98 F (36.7 C)  TempSrc:    Oral  SpO2:  94% 95%   Weight:      Height:        Intake/Output Summary (Last 24 hours) at 01/25/2023 2019 Last data filed at 01/25/2023 2000 Gross per 24 hour  Intake 2418.44 ml  Output --  Net 2418.44 ml   Filed Weights   01/23/23 1149 01/24/23 0500 01/25/23 1158  Weight: 56.2 kg 58.2 kg 59 kg    Examination:   Constitutional: NAD, alert, oriented, restless HEENT: conjunctivae and lids normal, EOMI CV: No cyanosis.   RESP: normal  respiratory effort, on 6L Neuro: II - XII grossly intact.   Psych: subdued mood and affect.     Data Reviewed: I have personally reviewed labs and imaging studies  Time spent: 50 minutes, critical care billing  Darlin Priestly, MD [facility] If 7PM-7AM, please contact night-coverage 01/25/2023, 8:19 PM

## 2023-01-25 NOTE — Progress Notes (Signed)
Lab at bedside drawing type and screen for transfusion

## 2023-01-25 NOTE — Progress Notes (Signed)
   01/25/23 0412  Vitals  Temp 98 F (36.7 C)  BP (!) 107/58  MAP (mmHg) 72  BP Location Left Leg  BP Method Automatic  Patient Position (if appropriate) Lying  Pulse Rate (!) 128  Pulse Rate Source Monitor  Resp 20  MEWS COLOR  MEWS Score Color Yellow  Oxygen Therapy  SpO2 93 %  O2 Device Nasal Cannula  O2 Flow Rate (L/min) 4 L/min  Pain Assessment  Pain Scale 0-10  Pain Score 0  MEWS Score  MEWS Temp 0  MEWS Systolic 0  MEWS Pulse 2  MEWS RR 0  MEWS LOC 0  MEWS Score 2  Provider Notification  Provider Name/Title Jon Billings  Date Provider Notified 01/25/23  Time Provider Notified [phone] (was in communication at 425 with provider about patient HR)  Method of Notification  (secure chat)  Notification Reason Change in status;Other (Comment) (yellow MEWS)  Provider response See new orders  Date of Provider Response 01/25/23  Time of Provider Response 0432   At 0215 post breathing treatment patient became sinus tach on tele. Between 122-128. States that she is not in pain or other discomfort, abd was soft. I continue to observe patient and when she went back to sleep her heart rate was back down to 110 bmp and fluctuating. At 4am HR is 128-133 and sustaining. V/s was done and provider inform of new onset sinus tach.Stat orders for EKG, xray and labs were put in place, Patient had x 1 dark coffee brown emesis at 0500. Then dark brown watery coffee bm x 3. Upper abdomen with some distension. Last 2 bm dah 2 small blood clot and dark red blood could been seen. Patient is pale and ill appearing. Sob with exertion. Continue to moan but denies any pain, patient is alert and oriented. Provider ask to come and see patient since she became restless. Morrsion NP did come to floor and assessed patient. Bolus fluid ordered.

## 2023-01-25 NOTE — Consult Note (Signed)
Palliative Medicine [facility] at [facility] [phone] [phone]   Name: Clorene Kyriacou Date: 01/25/2023 MRN: [id number]  DOB: [birth year]  Patient Care Team: [name], MD as PCP - General (Family Medicine) Glory Buff, RN as Oncology Nurse Navigator    REASON FOR CONSULTATION: Crystal Mcmahon is a 82 y.o. 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  Patient admitted to the hospital on 01/23/2023 with sepsis from pneumonia.  Her hospitalization has been complicated by GI bleed.  She was referred to palliative care to address goals.  SOCIAL HISTORY:     reports that she has been smoking cigarettes. She has a 8.00 pack-year smoking history. She has never used smokeless tobacco. She reports that she does not drink alcohol and does not use drugs.  Patient is married but her husband lives in an assisted living facility.  Patient lives at home alone.  She has a son in [place] and a daughter in [place].  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   ADVANCE DIRECTIVES:  On file  CODE STATUS: DNR  PAST MEDICAL HISTORY: Past Medical History:  Diagnosis Date   Actinic keratosis    Basal cell carcinoma 06/14/2020   posterior neck, EDC   Breast cancer (HCC) 2008   left   Cancer (HCC)    melanoma   Complication of anesthesia    slow to wake up after surgery   GERD (gastroesophageal reflux disease)    Hypertension    Melanoma (HCC) 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 Malignant melanoma, lentigo maligna type. Clark's level II, Breslow's 0.22mm   Personal history of radiation therapy    Left breast   Personal history of tobacco use, presenting hazards to health 05/31/2015   Tobacco abuse 01/27/2015   Tobacco abuse 01/27/2015    PAST SURGICAL HISTORY:  Past Surgical History:  Procedure Laterality Date   BREAST EXCISIONAL BIOPSY Left 2008   + radation   BREAST  EXCISIONAL BIOPSY Right 2001 2010   neg surgical bx's   BREAST LUMPECTOMY Left 2008   w/radiation   CATARACT EXTRACTION W/PHACO Right 04/12/2016   Procedure: Cataract extraction phaco and intraocular lens placement right eye;  Surgeon: Lockie Mola, MD;  Location: [facility];  Service: Ophthalmology;  Laterality: Right;   CATARACT EXTRACTION W/PHACO Left 01/24/2017   Procedure: CATARACT EXTRACTION PHACO AND INTRAOCULAR LENS PLACEMENT (IOC) Complicated left;  Surgeon: Lockie Mola, MD;  Location: [facility];  Service: Ophthalmology;  Laterality: Left;  Complicated Malyugin   CHOLECYSTECTOMY     D&C and hysteroscopy  1996   LEEP  1996   TUBAL LIGATION      HEMATOLOGY/ONCOLOGY HISTORY:  Oncology History Overview Note  MARCH 2024- MRi cervical spine- IMPRESSION: 1. Findings compatible with multifocal osseous metastatic disease, as described above. 2. Abnormal pleuroparenchymal thickening/mass at the left lung apex, concerning for Pancoast tumor or less likely metastasis. This malignancy probably involves the left C7-T1 and T1-T2 foramina and encroaches on more inferior left thoracic foramina, incompletely imaged. Recommend dedicated CT of the chest (preferably with contrast) to further evaluate the chest. Also, dedicated MRI of the thoracic spine with contrast could further characterize thoracic extent if clinically warranted.  SOFT TISSUE NODULE, RIGHT FLANK; ULTRASOUND-GUIDED BIOPSY:  - POORLY DIFFERENTIATED CARCINOMA COMPATIBLE WITH METASTATIC  ADENOCARCINOMA OF LUNG ORIGIN.   Hx DCIS; history of superficial early stage melanoma; and stage 1 thyroid cancer  Malignant neoplasm of breast (HCC)  10/05/2006 Initial Diagnosis   Malignant neoplasm of left breast (HCC), Tis N0 M0, ER positive PR negative   10/22/2006 Surgery   Lumpectomy   10/22/2006 -  Radiation Therapy     06/22/2007 - 06/21/2012 Anti-estrogen oral therapy   Arimidex   Primary  cancer of left upper lobe of lung (HCC)  12/22/2022 Initial Diagnosis   Primary cancer of left upper lobe of lung   12/22/2022 Cancer Staging   Staging form: Lung, AJCC 8th Edition - Clinical: Stage IVB (cT2, cN2, cM1c) - Signed by Earna Coder, MD on 12/22/2022   01/22/2023 -  Chemotherapy   Patient is on Treatment Plan : LUNG NSCLC Pembrolizumab (200) q21d       ALLERGIES:  is allergic to acetaminophen-pamabrom, codeine, and midol pm [diphenhydramine-apap (sleep)].  MEDICATIONS:  Current Facility-Administered Medications  Medication Dose Route Frequency Provider Last Rate Last Admin   0.9 %  sodium chloride infusion (Manually program via Guardrails IV Fluids)   Intravenous Once Manuela Schwartz, NP       0.9 %  sodium chloride infusion (Manually program via Guardrails IV Fluids)   Intravenous Once Darlin Priestly, MD       0.9 %  sodium chloride infusion  250 mL Intravenous Continuous Chesley Noon, MD   Stopped at 01/23/23 1424   ascorbic acid (VITAMIN C) tablet 500 mg  500 mg Oral BID Darlin Priestly, MD   500 mg at 01/24/23 2209   ceFEPIme (MAXIPIME) 2 g in sodium chloride 0.9 % 100 mL IVPB  2 g Intravenous Q12H Paliwal, Aditya, MD 200 mL/hr at 01/25/23 0109 2 g at 01/25/23 0109   Chlorhexidine Gluconate Cloth 2 % PADS 6 each  6 each Topical QHS Raechel Chute, MD   6 each at 01/23/23 2210   docusate sodium (COLACE) capsule 100 mg  100 mg Oral BID PRN Ezequiel Essex, NP       erythromycin 250 mg in sodium chloride 0.9 % 100 mL IVPB  250 mg Intravenous NOW Jaynie Collins, DO       feeding supplement (ENSURE ENLIVE / ENSURE PLUS) liquid 237 mL  237 mL Oral TID BM Darlin Priestly, MD       hydrocortisone sodium succinate (SOLU-CORTEF) 100 MG injection 100 mg  100 mg Intravenous Q12H Dgayli, Lianne Bushy, MD   100 mg at 01/25/23 0828   ipratropium-albuterol (DUONEB) 0.5-2.5 (3) MG/3ML nebulizer solution 3 mL  3 mL Nebulization Q6H PRN Ezequiel Essex, NP       ipratropium-albuterol (DUONEB) 0.5-2.5  (3) MG/3ML nebulizer solution 3 mL  3 mL Nebulization Q6H Darlin Priestly, MD   3 mL at 01/25/23 1433   midodrine (PROAMATINE) tablet 10 mg  10 mg Oral TID WC Darlin Priestly, MD       morphine (MSIR) tablet 7.5 mg  7.5 mg Oral Q8H PRN Rust-Chester, Britton L, NP   7.5 mg at 01/23/23 2257   multivitamin with minerals tablet 1 tablet  1 tablet Oral Daily Darlin Priestly, MD       ondansetron [facility]) injection 4 mg  4 mg Intravenous Q6H PRN Ezequiel Essex, NP   4 mg at 01/25/23 0746   pantoprazole (PROTONIX) injection 40 mg  40 mg Intravenous Q12H Manuela Schwartz, NP   40 mg at 01/25/23 0747   polyethylene glycol (MIRALAX / GLYCOLAX) packet 17 g  17 g Oral Daily PRN Ezequiel Essex, NP       potassium  chloride 10 mEq in 100 mL IVPB  10 mEq Intravenous Q1 Hr x 4 Darlin Priestly, MD 100 mL/hr at 01/25/23 1504 10 mEq at 01/25/23 1504   sodium chloride 0.9 % 1,000 mL with potassium chloride 80 mEq infusion   Intravenous Continuous Darlin Priestly, MD 100 mL/hr at 01/25/23 0950 New Bag at 01/25/23 0950   vancomycin (VANCOREADY) IVPB 750 mg/150 mL  750 mg Intravenous Q24H Bettey Costa, RPH   Stopped at 01/24/23 1823    VITAL SIGNS: BP (!) 146/57   Pulse (!) 101   Temp 98.3 F (36.8 C) (Oral)   Resp 18   Ht 5' (1.524 m)   Wt 130 lb 1.1 oz (59 kg)   SpO2 96%   BMI 25.40 kg/m  Filed Weights   01/23/23 1149 01/24/23 0500 01/25/23 1158  Weight: 123 lb 14.4 oz (56.2 kg) 128 lb 4.9 oz (58.2 kg) 130 lb 1.1 oz (59 kg)    Estimated body mass index is 25.4 kg/m as calculated from the following:   Height as of this encounter: 5' (1.524 m).   Weight as of this encounter: 130 lb 1.1 oz (59 kg).  LABS: CBC:    Component Value Date/Time   WBC 7.5 01/25/2023 0552   HGB 6.7 (L) 01/25/2023 0552   HGB 9.7 (L) 01/19/2023 1502   HGB 14.2 12/30/2014 1033   HCT 21.0 (L) 01/25/2023 0552   HCT 42.6 12/30/2014 1033   PLT 308 01/25/2023 0552   PLT 293 01/19/2023 1502   PLT 266 12/30/2014 1033   MCV 95.0 01/25/2023 0552   MCV 92  12/30/2014 1033   NEUTROABS 6.3 01/25/2023 0552   NEUTROABS 5.4 12/30/2014 1033   LYMPHSABS 0.7 01/25/2023 0552   LYMPHSABS 1.8 12/30/2014 1033   MONOABS 0.2 01/25/2023 0552   MONOABS 0.5 12/30/2014 1033   EOSABS 0.0 01/25/2023 0552   EOSABS 0.0 12/30/2014 1033   BASOSABS 0.0 01/25/2023 0552   BASOSABS 0.1 12/30/2014 1033   Comprehensive Metabolic Panel:    Component Value Date/Time   NA 142 01/25/2023 0552   NA 136 12/30/2014 1033   K 2.8 (L) 01/25/2023 0552   K 3.0 (L) 12/30/2014 1033   CL 110 01/25/2023 0552   CL 100 (L) 12/30/2014 1033   CO2 19 (L) 01/25/2023 0552   CO2 30 12/30/2014 1033   BUN 33 (H) 01/25/2023 0552   BUN 21 (H) 12/30/2014 1033   CREATININE 0.84 01/25/2023 0552   CREATININE 1.14 (H) 01/19/2023 1502   CREATININE 0.99 12/30/2014 1033   GLUCOSE 160 (H) 01/25/2023 0552   GLUCOSE 117 (H) 12/30/2014 1033   CALCIUM 7.4 (L) 01/25/2023 0552   CALCIUM 9.2 12/30/2014 1033   AST 46 (H) 01/25/2023 0552   AST 38 01/19/2023 1502   ALT 19 01/25/2023 0552   ALT 27 01/19/2023 1502   ALT 15 12/30/2014 1033   ALKPHOS 49 01/25/2023 0552   ALKPHOS 47 12/30/2014 1033   BILITOT 0.9 01/25/2023 0552   BILITOT 0.4 01/19/2023 1502   PROT 4.8 (L) 01/25/2023 0552   PROT 7.3 12/30/2014 1033   ALBUMIN 1.8 (L) 01/25/2023 0552   ALBUMIN 4.1 12/30/2014 1033    RADIOGRAPHIC STUDIES: DG Chest Port 1 View  Result Date: 01/25/2023 CLINICAL DATA:  Tachycardia EXAM: PORTABLE CHEST 1 VIEW COMPARISON:  Yesterday FINDINGS: Airspace disease at the upper lobes greater on the right. Haziness at the right base is from pleural fluid by CT. Normal heart size. Artifact from EKG leads. IMPRESSION: Biapical  pneumonia that is worse on the right, stable from prior. Electronically Signed   By: Tiburcio Pea M.D.   On: 01/25/2023 05:03   US THORACENTESIS ASP PLEURAL SPACE W/IMG GUIDE  Result Date: 01/24/2023 INDICATION: Parapneumonic pleural effusion EXAM: ULTRASOUND GUIDED RIGHT THORACENTESIS  MEDICATIONS: None. COMPLICATIONS: None immediate. PROCEDURE: An ultrasound guided thoracentesis was thoroughly discussed with the patient and questions answered. The benefits, risks, alternatives and complications were also discussed. The patient understands and wishes to proceed with the procedure. Written consent was obtained. Ultrasound was performed to localize and mark an adequate pocket of fluid in the right chest. The area was then prepped and draped in the normal sterile fashion. 1% Lidocaine was used for local anesthesia. Under ultrasound guidance a 19 gauge, 7-cm, Yueh catheter was introduced. Thoracentesis was performed. The catheter was removed and a dressing applied. FINDINGS: A total of approximately 330 ml of clear yellow fluid was removed. Samples were sent to the laboratory as requested by the clinical team. IMPRESSION: Successful ultrasound guided right thoracentesis yielding 330 mL of pleural fluid. Electronically Signed   By: Olive Bass M.D.   On: 01/24/2023 14:23   DG Chest Port 1 View  Result Date: 01/24/2023 CLINICAL DATA:  Pleural effusion post right thoracentesis. EXAM: PORTABLE CHEST 1 VIEW COMPARISON:  01/23/2023 FINDINGS: Heart size and pulmonary vascularity are normal. No significant pleural effusion or pneumothorax identified. Bilateral upper lobe airspace consolidation with volume loss. Changes may represent pneumonia or postobstructive process. Left upper lung infiltrates are progressing since the previous study. Surgical clips in the right upper quadrant. IMPRESSION: 1. No significant pleural effusion or pneumothorax identified. 2. Bilateral upper lobe airspace consolidation, progressing on the left since prior study. Electronically Signed   By: Burman Nieves M.D.   On: 01/24/2023 12:05   CT Chest W Contrast  Result Date: 01/23/2023 CLINICAL DATA:  Sepsis; stage IV lung malignancy; * Tracking Code: BO * EXAM: CT CHEST WITH CONTRAST TECHNIQUE: Multidetector CT imaging of  the chest was performed during intravenous contrast administration. RADIATION DOSE REDUCTION: This exam was performed according to the departmental dose-optimization program which includes automated exposure control, adjustment of the mA and/or kV according to patient size and/or use of iterative reconstruction technique. CONTRAST:  75mL OMNIPAQUE IOHEXOL 300 MG/ML  SOLN COMPARISON:  PET-CT dated Feb 03, 2023 FINDINGS: Cardiovascular: Normal heart size. No pericardial effusion. Normal caliber thoracic aorta with severe atherosclerotic disease. Mediastinum/Nodes: Small to moderate hiatal hernia. Enlarged mediastinal and left supraclavicular lymph nodes, unchanged when compared with the prior exam. Reference AP window lymph node measuring 2.1 x 1.8 cm on series 2, image 48, unchanged when compared with the prior exam. Lungs/Pleura: Central airways are patent. Similar left apical mass with associated pleural thickening. New large right upper lobe consolidation and left upper lobe ground-glass opacities. New multiple solid pulmonary nodules. Reference new solid pulmonary nodule of the left lower lobe measuring 1.7 x 1.3 cm. Similar nodular ground-glass opacities of the left upper lobe. New small right-greater-than-left pleural effusions and bibasilar atelectasis. 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 Prior cholecystectomy. Partially visualized simple appearing cyst of the right kidney. Musculoskeletal: Old anterolateral right-sided rib fractures. No aggressive appearing osseous lesions. IMPRESSION: 1. New large right upper lobe consolidation and left upper lobe ground-glass opacities, findings are likely due to pneumonia. 2. New small right-greater-than-left pleural effusions and bibasilar atelectasis. 3. Similar left apical mass with associated pleural thickening. 4. Multiple new large solid pulmonary nodules, concerning for progressive metastatic disease,  although findings could also be  infectious. Recommend short-term follow-up 1-2 months for further evaluation. 5. Stable mediastinal and left supraclavicular lymphadenopathy. 6. Aortic Atherosclerosis (ICD10-I70.0). Electronically Signed   By: Allegra Lai M.D.   On: 01/23/2023 16:03   DG Chest Portable 1 View  Result Date: 01/23/2023 CLINICAL DATA:  Fever EXAM: PORTABLE CHEST 1 VIEW COMPARISON:  Radiograph 01/13/2023 FINDINGS: Unchanged cardiomediastinal silhouette. There is confluent right upper lobe airspace disease and patchy airspace opacities in the mid to lower lungs. Trace pleural effusions. Unchanged left apical mass and pleural thickening. No evidence of pneumothorax. No acute osseous abnormality. IMPRESSION: Confluent right upper lobe airspace disease and patchy airspace opacities in the mid to lower lungs, concerning for multifocal pneumonia. Trace pleural effusions. Unchanged left apical mass and pleural thickening. Electronically Signed   By: Caprice Renshaw M.D.   On: 01/23/2023 13:20   DG Chest 2 View  Result Date: 01/13/2023 CLINICAL DATA:  Hypotensive, immunocompromise. 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 There are likely small bilateral pleural effusions. There is no focal lung infiltrate. The cardiomediastinal silhouette is within normal limits. Bones are osteopenic. No acute fractures are seen. There surgical clips in the right abdomen. IMPRESSION: 1. Small bilateral pleural effusions. 2. 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 Electronically Signed   By: Darliss Cheney M.D.   On: 01/13/2023 18:29   MR Brain W Wo Contrast  Addendum Date: 01/01/2023   ADDENDUM REPORT: 01/01/2023 16:19 ADDENDUM: Critical Value/emergent results were called by telephone at the time of interpretation on 01/01/2023 at 4:10 pm to provider [facility] , who verbally acknowledged these results. Electronically Signed   By:  Orvan Falconer M.D.   On: 01/01/2023 16:19   Result Date: 01/01/2023 CLINICAL DATA:  Metastatic disease evaluation. Left arm numbness since January. EXAM: MRI HEAD WITHOUT AND WITH CONTRAST TECHNIQUE: Multiplanar, multiecho pulse sequences of the brain and surrounding structures were obtained without and with intravenous contrast. CONTRAST:  5mL GADAVIST GADOBUTROL 1 MMOL/ML IV SOLN COMPARISON:  None Available. 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 No acute hemorrhage. Chronic microhemorrhage in the left superior frontal gyrus. Moderate chronic small-vessel disease. No hydrocephalus or extra-axial collection. No mass or abnormal enhancement. Vascular: Normal flow voids and enhancement. Skull and upper cervical spine: Normal marrow signal and enhancement. Sinuses/Orbits: Unremarkable. Other: None. IMPRESSION: 1. 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 2. No evidence of intracranial metastatic disease. Radiology assistant personnel have been notified to put me in telephone contact with the referring physician or the referring physician's clinical representative in order to discuss these findings. Once this communication is established I will issue an addendum to this report for documentation purposes. Electronically Signed: By: Orvan Falconer M.D. On: 01/01/2023 16:03    PERFORMANCE STATUS (ECOG) : 3 - Symptomatic, >50% confined to bed  Review of Systems Unless otherwise noted, a complete review of systems is negative.  Physical Exam General: NAD Pulmonary: Unlabored Extremities: no edema, no joint deformities Skin: no rashes Neurological: Weakness but otherwise nonfocal  IMPRESSION: Patient known to me from the clinic.  Patient transferred to ICU earlier today due to hypotension with active GI bleed after having episodes of hematemesis and bowel movement with bloody clots.  She is currently receiving blood  transfusion.  Patient was seen in consultation earlier today by GI with recommendation  for endoscopy.  Patient refused.  I met with patient and her cousin to discuss goals.  Patient says initially that she did not recall speaking with GI earlier today but does recall refusing further workup.  She says "I just want to die and go to heaven."  However, she clarifies that she feels this way due to how poorly she feels today.  We discussed the possibility that endoscopy and resolution of her GI bleed would make her feel better.  We also discussed her cancer treatment and that a decision to forego treatment would likely result in a referral to hospice.  After lengthy discussion, patient stated that she had changed her mind and would consent to endoscopy.  She agreed to further discussion of goals regarding cancer treatment in the outpatient setting.  I called and spoke with her son and updated him.  He verbalized agreement with endoscopy.  PLAN: -Continue current scope of treatment -Patient is a DNR -Will plan outpatient follow-up  Case and plan discussed with Drs. Sidonie Dickens, and Timothy Lasso  Time Total: 45 minutes  Visit consisted of counseling and education dealing with the complex and emotionally intense issues of symptom management and palliative care in the setting of serious and potentially life-threatening illness.Greater than 50%  of this time was spent counseling and coordinating care related to the above assessment and plan.  Signed by: Laurette Schimke, PhD, NP-C

## 2023-01-25 NOTE — Op Note (Addendum)
[facility] Patient Name: Crystal Mcmahon Procedure Date: 01/25/2023 3:46 PM MRN: [id number] Account #: [id number] Date of Birth: 10-Jul-1941 Admit Type: Inpatient Age: 82 Room: [facility] ENDO ROOM 1 Gender: Female Note Status: Finalized Instrument Name: Bleederscope [id number] Procedure:             Upper GI endoscopy Indications:           Hematemesis, Suspected upper gastrointestinal bleeding Providers:             Trenda Moots, DO Referring MD:          Rhona Leavens. Burnett Sheng, MD (Referring MD) Medicines:             Monitored Anesthesia Care Complications:         No immediate complications. Estimated blood loss:                         Minimal. Procedure:             Pre-Anesthesia Assessment:                        - Prior to the procedure, a History and Physical was                         performed, and patient medications and allergies were                         reviewed. The patient is competent. The risks and                         benefits of the procedure and the sedation options and                         risks were discussed with the patient. All questions                         were answered and informed consent was obtained.                         Patient identification and proposed procedure were                         verified by the physician, the nurse, the                         anesthesiologist, the anesthetist and the technician                         in the endoscopy suite. Mental Status Examination:                         alert and oriented. Airway Examination: normal                         oropharyngeal airway and neck mobility. Respiratory                         Examination: poor air movement. CV Examination:  tachycardia noted. Prophylactic Antibiotics: The                         patient does not require prophylactic antibiotics.                         Prior Anticoagulants: The patient  has taken no                         anticoagulant or antiplatelet agents. ASA Grade                         Assessment: E - Emergency. After reviewing the risks                         and benefits, the patient was deemed in satisfactory                         condition to undergo the procedure. The anesthesia                         plan was to use monitored anesthesia care (MAC).                         Immediately prior to administration of medications,                         the patient was re-assessed for adequacy to receive                         sedatives. The heart rate, respiratory rate, oxygen                         saturations, blood pressure, adequacy of pulmonary                         ventilation, and response to care were monitored                         throughout the procedure. The physical status of the                         patient was re-assessed after the procedure.                        After obtaining informed consent, the endoscope was                         passed under direct vision. Throughout the procedure,                         the patient's blood pressure, pulse, and oxygen                         saturations were monitored continuously. The Endoscope                         was introduced through the mouth, and advanced to the  second part of duodenum. The upper GI endoscopy was                         accomplished without difficulty. The patient tolerated                         the procedure well. Findings:      Esophagogastric landmarks were identified: the gastroesophageal junction       was found at 38 cm from the incisors.      A 3 cm hiatal hernia was present.      One non-bleeding cratered gastric ulcer with a clean ulcer base (Forrest       Class III) was found in the prepyloric region of the stomach. The lesion       was 4 mm in largest dimension. Estimated blood loss: none.      Hematin (altered  blood/coffee-ground-like material) was found in the       entire examined stomach. Estimated blood loss: none.      Moderately severe esophagitis with no bleeding was found. Estimated       blood loss: none.      One non-bleeding cratered duodenal ulcer with an adherent clot (Forrest       Class IIb) was found in the duodenal bulb. The lesion was 6 mm in       largest dimension. Area was successfully injected with 3 mL of a 0.1       mg/mL solution of epinephrine for hemostasis. Clot was then cleared away       with lavage and suction exposing the ulcer base. There was potentially a       visible vessel versus still adherent clot. This was not cleared away to       avoid bleeding restarting. For hemostasis, hemostatic spray was       deployed. Purastat was applied. 3cc total spray was applied. There was       no bleeding at the end of the procedure. Estimated blood loss: none.      The exam of the duodenum was otherwise normal. Impression:            - Esophagogastric landmarks identified.                        - 3 cm hiatal hernia.                        - Non-bleeding gastric ulcer with a clean ulcer base                         (Forrest Class III).                        - Hematin (altered blood/coffee-ground-like material)                         in the entire stomach.                        - Moderately severe radiation esophagitis with no                         bleeding.                        -  Non-bleeding duodenal ulcer with an adherent clot                         (Forrest Class IIb). Injected. hemostatic spray                         applied.                        - No specimens collected. Recommendation:        - Patient has a contact number available for                         emergencies. The signs and symptoms of potential                         delayed complications were discussed with the patient.                         Return to normal activities tomorrow. Written                          discharge instructions were provided to the patient.                        - Return patient to ICU for ongoing care.                        - NPO for 4 hours.                        - Clear liquid diet today.                        - Continue present medications.                        - No aspirin, ibuprofen, naproxen, or other                         non-steroidal anti-inflammatory drugs.                        - If rebleeding occurs (20-40% rate of recurrence                         based on clot vs visible vessel respectively),                         recommend vascular surgery/interventional radiology                         consult for potential embolization.                        Continue to monitor H&H; transfusion and resuscitation                         as per primary team                        May  continue to pass old blood/clot with stool.                        - Repeat upper endoscopy in 8 weeks to evaluate the                         response to therapy.                        - continue twice a day ppi for 12 weeks.                        - The findings and recommendations were discussed with                         the referring physician.                        - The findings and recommendations were discussed with                         the patient.                        - The findings and recommendations were discussed with                         the patient's family (son, Tommy via phone). Procedure Code(s):     --- Professional ---                        [phone], Esophagogastroduodenoscopy, flexible,                         transoral; with control of bleeding, any method Diagnosis Code(s):     --- Professional ---                        K44.9, Diaphragmatic hernia without obstruction or                         gangrene                        K25.9, Gastric ulcer, unspecified as acute or chronic,                         without  hemorrhage or perforation                        K92.2, Gastrointestinal hemorrhage, unspecified                        T66.XXXA, Radiation sickness, unspecified, initial                         encounter                        K20.80, Other esophagitis without bleeding                        K26.4, Chronic or unspecified  duodenal ulcer with                         hemorrhage                        K92.0, Hematemesis CPT copyright 2022 [employer]. All rights reserved. The codes documented in this report are preliminary and upon coder review may  be revised to meet current compliance requirements. Attending Participation:      I personally performed the entire procedure. Elfredia Nevins, DO Jaynie Collins DO, DO 01/25/2023 4:33:29 PM This report has been signed electronically. Number of Addenda: 0 Note Initiated On: 01/25/2023 3:46 PM Estimated Blood Loss:  Estimated blood loss was minimal.      [facility]

## 2023-01-25 NOTE — Progress Notes (Signed)
MD made aware patient continues to have coffee ground emesis and just had a large bloody bowel movement. 1 unit PRBCs infusing. Patient cleansed and repositioned.

## 2023-01-25 NOTE — Progress Notes (Signed)
Patient brought to unit from ICU by nurse. Alert and oriented, states she is very tired. On 4L of 02. Iv intact bilaterally. Made comfortable in room. Pure wick replaced. Orientated to call bell.

## 2023-01-25 NOTE — Transfer of Care (Signed)
Immediate Anesthesia Transfer of Care Note  Patient: Crystal Mcmahon  Procedure(s) Performed: ESOPHAGOGASTRODUODENOSCOPY (EGD) WITH PROPOFOL  Patient Location: Endoscopy Unit  Anesthesia Type:General  Level of Consciousness: drowsy  Airway & Oxygen Therapy: Patient Spontanous Breathing and Patient connected to face mask oxygen  Post-op Assessment: Report given to RN  Post vital signs: stable  Last Vitals:  Vitals Value Taken Time  BP 96/50 01/25/23 1613  Temp    Pulse 100 01/25/23 1613  Resp 33 01/25/23 1613  SpO2 96 % 01/25/23 1613  Vitals shown include unvalidated device data.  Last Pain:  Vitals:   01/25/23 1538  TempSrc: Temporal  PainSc: 0-No pain      Patients Stated Pain Goal: 6 (01/23/23 2215)  Complications: No notable events documented.

## 2023-01-25 NOTE — Progress Notes (Signed)
MD updated on patient status. Pt had episodes of vomiting coffee ground emesis early this AM. Night shift also reported liquid stool with dime sized blood clots. Pt currently receiving 1st bag of IV potassium for a K+ of 2.8. Blood transfusion also ordered. Awaiting type and screen

## 2023-01-25 NOTE — Progress Notes (Addendum)
Brief GI update note  After discussion with palliative care, patient with her cousin and friend at bedside has decided to go undergo upper endoscopy  Receiving 2nd unit of prbc  I also spoke to the patient again and she is willing to undergo the procedure.  I discussed with her the risks and benefits of undergoing the procedure including the potential need to stay intubated postprocedure.  She verbalized understanding and is agreeable to this.  I have asked family to update her son, Orvilla Fus, of the change in plan. Will perform EGD emergently/urgently given continued bleeding requiring transfusion. She has received 30 meq iv potassium.   Esophagogastroduodenoscopy with possible biopsy, control of bleeding, polypectomy, and interventions as necessary has been discussed with the patient/patient representative. Informed consent was obtained from the patient/patient representative after explaining the indication, nature, and risks of the procedure including but not limited to death, bleeding, perforation, missed neoplasm/lesions, cardiorespiratory compromise, and reaction to medications. Opportunity for questions was given and appropriate answers were provided. Patient/patient representative has verbalized understanding is amenable to undergoing the procedure.  Enis Slipper, DO [facility]

## 2023-01-25 NOTE — Progress Notes (Signed)
Patient transferred via bed to ICU with 1st unit PRBCs infusing. Bedside report along with secure chat report given prior to transfer.

## 2023-01-25 NOTE — Care Plan (Signed)
Brief GI Post op note  Patient underwent upper endoscopy.  Please see op report for further details Demonstrated to have significant esophagitis likely related to recent radiation  She was also found to have a prepyloric ulcer that was clean-based and not actively bleeding.  Hematin throughout the stomach.  She also had a large duodenal bulb ulcer just posterior to the pylorus.  A large clot was noted.  The area was injected with 1 in 10,000 epinephrine with good blanching effect.  At this point the clot was cleaned away with suction and lavage.  The ulcer base was exposed with some remnant clot versus visible vessel.  This was not removed away.  The area was then treated with 3 cc of purastat with no active bleeding at the end of the procedure.  Should bleeding recur (20 to 40% chance) would recommend vascular surgery/IR intervention for embolization.  I have updated the referring team as well as the patient's son, Orvilla Fus, by phone. The patient was also informed of the procedure results.  NPO for 4 hours post procedure. Can then resume clear liquid diet  Continue 40 mg iv protonix q12 hours I would expect her to continue to pass some old blood and clot via her stool given the significance of her bleed. Monitor H&H.  Transfusion and resuscitation as per primary team Avoid frequent lab draws to prevent lab induced anemia Supportive care and antiemetics as per primary team Maintain two sites IV access Avoid nsaids Monitor for GIB.  Will continue to follow  Enis Slipper, DO [facility]

## 2023-01-25 NOTE — Progress Notes (Signed)
Patient tolerating blood transfusion without any signs or symptoms of transfusion reaction. Family member at bedside. Will continue to monitor.

## 2023-01-25 NOTE — Anesthesia Preprocedure Evaluation (Addendum)
Anesthesia Evaluation  Patient identified by MRN, date of birth, ID band Patient awake    Reviewed: Allergy & Precautions, H&P , NPO status , Patient's Chart, lab work & pertinent test results, reviewed documented beta blocker date and time   History of Anesthesia Complications Negative for: history of anesthetic complications  Airway Mallampati: II  TM Distance: >3 FB Neck ROM: full    Dental  (+) Upper Dentures, Dental Advidsory Given   Pulmonary neg pulmonary ROS, shortness of breath and at rest, pneumonia, unresolved, Current Smoker and Patient abstained from smoking.   Pulmonary exam normal breath sounds clear to auscultation       Cardiovascular Exercise Tolerance: Good hypertension, negative cardio ROS Normal cardiovascular exam Rhythm:regular Rate:Normal     Neuro/Psych negative neurological ROS  negative psych ROS   GI/Hepatic negative GI ROS, Neg liver ROS,GERD  ,,  Endo/Other  negative endocrine ROS    Renal/GU negative Renal ROS  negative genitourinary   Musculoskeletal   Abdominal   Peds  Hematology negative hematology ROS (+)   Anesthesia Other Findings Patient has a PMH of shortness of breath. She was started on one liter of oxygen at home shortly before coming to the hospital. Patient is now requiring 5L of oxygen at rest with a saturation of 95%. Patient also admits to vomiting blood multiple times today, and the last time being several hours ago. Patient's potassium is also 2.8 on last check this morning. She has received IV potassium. Discussed her increased risk of adverse events under anesthesia with the patient including possible intubation after the surgery, arrhythmias under anesthesia and possible death. The patient states that she is aware of the increased risks and wants to go forth with the procedure. Dr. Timothy Lasso has declared this case an emergency and we will proceed with the case.   Past  Medical History: No date: Actinic keratosis 06/14/2020: Basal cell carcinoma     Comment:  posterior neck, EDC 2008: Breast cancer (HCC)     Comment:  left No date: Cancer (HCC)     Comment:  melanoma No date: Complication of anesthesia     Comment:  slow to wake up after surgery No date: GERD (gastroesophageal reflux disease) No date: Hypertension 02/01/2010: Melanoma (HCC)     Comment:  Right lateral cheek. Malignant melanoma, lentigo maligna              type. 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 No date: Personal history of radiation therapy     Comment:  Left breast 05/31/2015: Personal history of tobacco use, presenting hazards to  health 01/27/2015: Tobacco abuse 01/27/2015: Tobacco abuse  Past Surgical History: 2008: BREAST EXCISIONAL BIOPSY; Left     Comment:  + radation 2001 2010: BREAST EXCISIONAL BIOPSY; Right     Comment:  neg surgical bx's 2008: BREAST LUMPECTOMY; Left     Comment:  w/radiation 04/12/2016: CATARACT EXTRACTION W/PHACO; Right     Comment:  Procedure: Cataract extraction phaco and intraocular               lens placement right eye;  Surgeon: Lockie Mola,               MD;  Location: [facility];  Service:               Ophthalmology;  Laterality: Right; 01/24/2017: CATARACT EXTRACTION W/PHACO; Left     Comment:  Procedure: CATARACT EXTRACTION PHACO AND INTRAOCULAR  LENS PLACEMENT (IOC) Complicated left;  Surgeon:               Lockie Mola, MD;  Location: [facility];              Service: Ophthalmology;  Laterality: Left;                Complicated M[place]ugin No date: CHOLECYSTECTOMY 1996: D&C and hysteroscopy 1996: LEEP No date: TUBAL LIGATION  BMI    Body Mass Index: 25.40 kg/m      Reproductive/Obstetrics negative OB ROS                             Anesthesia Physical Anesthesia Plan  ASA: 4 and emergent  Anesthesia Plan: General   Post-op Pain Management:     Induction: Intravenous  PONV Risk Score and Plan: 3 and Ondansetron, Treatment may vary due to age or medical condition and Midazolam  Airway Management Planned: Nasal Cannula and Natural Airway  Additional Equipment:   Intra-op Plan:   Post-operative Plan: Possible Post-op intubation/ventilation  Informed Consent: I have reviewed the patients History and Physical, chart, labs and discussed the procedure including the risks, benefits and alternatives for the proposed anesthesia with the patient or authorized representative who has indicated his/her understanding and acceptance.     Dental Advisory Given  Plan Discussed with: CRNA  Anesthesia Plan Comments: (Patient consented for risks of anesthesia including but not limited to:  - adverse reactions to medications - risk of airway placement if required - damage to eyes, teeth, lips or other oral mucosa - nerve damage due to positioning  - sore throat or hoarseness - Damage to heart, brain, nerves, lungs, other parts of body or loss of life  Patient voiced understanding.)        Anesthesia Quick Evaluation

## 2023-01-25 NOTE — Plan of Care (Signed)

## 2023-01-25 NOTE — Anesthesia Postprocedure Evaluation (Signed)
Anesthesia Post Note  Patient: Crystal Mcmahon  Procedure(s) Performed: ESOPHAGOGASTRODUODENOSCOPY (EGD) WITH PROPOFOL  Patient location during evaluation: PACU Anesthesia Type: General Level of consciousness: awake and alert Pain management: pain level controlled Vital Signs Assessment: post-procedure vital signs reviewed and stable Respiratory status: spontaneous breathing, nonlabored ventilation, respiratory function stable and patient connected to nasal cannula oxygen Cardiovascular status: blood pressure returned to baseline and stable Postop Assessment: no apparent nausea or vomiting Anesthetic complications: no  No notable events documented.   Last Vitals:  Vitals:   01/25/23 1538 01/25/23 1612  BP: (!) 131/58 (!) 96/50  Pulse: (!) 108 100  Resp: 16 (!) 30  Temp: (!) 35.9 C (!) 36 C  SpO2: 100% 96%    Last Pain:  Vitals:   01/25/23 1612  TempSrc: Temporal  PainSc: Asleep                 Stephanie Coup

## 2023-01-25 NOTE — Consult Note (Signed)
Inpatient Consultation   Patient ID: Crystal Mcmahon is a 82 y.o. female.  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  She wears 2 L nasal cannula at baseline and is now on 5 L at time of my evaluation.  Overnight the patient developed bloody emesis and then bowel movement with bloody clots with some hypotension requiring volume resuscitation and pressors.  (note at 0446).  GI was consulted for GI bleeding at 1040 this morning  Patient has received 1 unit PRBC and is to receive a second after hemoglobin was found to be 6.7.  On 427 her hemoglobin was 12.  BUN/creatinine ratio today 33/0.84.  She continues to have have some blood associate with her bowel movement.  Tachycardic but normotensive at this time without pressors.  Patient is not very forthcoming with information.  She is asked if she had abdominal pain and replied no, but upon physical exam she does elicit pain and then admits to this.  She was unaware of previous melena hematochezia hematemesis or coffee-ground emesis prior to hospitalization.  At this time she is on Protonix 40 mg twice a day IV  At this time she declines any procedures including upper endoscopy.  She instructed me to call her son Orvilla Fus which I did and spent over 18 minutes on the phone discussing the patient's current situation and her refusal of upper endoscopy.  At this time he would like to talk to her to try to convince her to undergo the procedure.  He did talk to her via phone, but upon my checking an hour later she has not changed her mind and still declines upper  endoscopy.  01/19/23 Oncology noted anemia developing, however, report no blood in stools or black or stools.   Denies NSAIDs, Anti-plt agents, and anticoagulants Father panc ca Denies other family history of gastrointestinal disease and malignancy Previous Endoscopies: None per chart review    Past Medical History:  Diagnosis Date   Actinic keratosis    Basal cell carcinoma 06/14/2020   posterior neck, EDC   Breast cancer (HCC) 2008   left   Cancer (HCC)    melanoma   Complication of anesthesia    slow to wake up after surgery   GERD (gastroesophageal reflux disease)    Hypertension    Melanoma (HCC) 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 Malignant melanoma, lentigo maligna type. Clark's level II, Breslow's 0.31mm   Personal history of radiation therapy    Left breast   Personal history of tobacco use, presenting hazards to health 05/31/2015   Tobacco abuse 01/27/2015   Tobacco abuse 01/27/2015    Past Surgical History:  Procedure Laterality Date   BREAST EXCISIONAL BIOPSY Left 2008   + radation   BREAST EXCISIONAL BIOPSY Right 2001 2010   neg surgical bx's   BREAST LUMPECTOMY Left 2008   w/radiation   CATARACT EXTRACTION W/PHACO Right 04/12/2016   Procedure: Cataract extraction phaco and intraocular lens placement right eye;  Surgeon: Lockie Mola, MD;  Location: [facility];  Service: Ophthalmology;  Laterality: Right;   CATARACT EXTRACTION W/PHACO Left 01/24/2017  Procedure: CATARACT EXTRACTION PHACO AND INTRAOCULAR LENS PLACEMENT (IOC) Complicated left;  Surgeon: Lockie Mola, MD;  Location: [facility];  Service: Ophthalmology;  Laterality: Left;  Complicated M[place]ugin   CHOLECYSTECTOMY     D&C and hysteroscopy  1996   LEEP  1996   TUBAL LIGATION      Allergies  Allergen Reactions   Acetaminophen-Pamabrom Rash and Swelling    Other reaction(s): Other (See Comments)  Other Reaction: RASH & SWELLING   Codeine Nausea And Vomiting    Midol Pm [Diphenhydramine-Apap (Sleep)] Swelling and Rash         Family History  Problem Relation Age of Onset   Pancreatic cancer Father    Breast cancer Neg Hx     Social History   Tobacco Use   Smoking status: Every Day    Packs/day: 0.25    Years: 32.00    Additional pack years: 0.00    Total pack years: 8.00    Types: Cigarettes   Smokeless tobacco: Never  Substance Use Topics   Alcohol use: No    Alcohol/week: 0.0 standard drinks of alcohol    Comment: occassional   Drug use: No     Pertinent GI related history and allergies were reviewed with the patient  Review of Systems  Constitutional:  Positive for chills, fatigue and fever. Negative for activity change, appetite change, diaphoresis and unexpected weight change.  HENT:  Negative for trouble swallowing and voice change.   Respiratory:  Positive for shortness of breath. Negative for wheezing.   Cardiovascular:  Negative for chest pain, palpitations and leg swelling.  Gastrointestinal:  Positive for abdominal pain and blood in stool. Negative for abdominal distention, anal bleeding, constipation, diarrhea, nausea, rectal pain and vomiting.  Skin:  Negative for color change and pallor.  Neurological:  Positive for weakness. Negative for dizziness and syncope.  Psychiatric/Behavioral:  Negative for agitation and confusion.   All other systems reviewed and are negative.    Medications Home Medications No current facility-administered medications on file prior to encounter.   Current Outpatient Medications on File Prior to Encounter  Medication Sig Dispense Refill   acetaminophen (TYLENOL) 650 MG CR tablet Take 650 mg by mouth every 8 (eight) hours as needed for pain.     aspirin EC 325 MG tablet Take 325 mg by mouth daily.     atorvastatin (LIPITOR) 20 MG tablet Take 20 mg by mouth daily.     calcium carbonate (OSCAL) 1500 (600 CA) MG TABS tablet Take 600 mg of elemental calcium by mouth 2 (two) times daily  with a meal.     citalopram (CELEXA) 10 MG tablet Take 10 mg by mouth daily.     gabapentin (NEURONTIN) 300 MG capsule Take 300 mg by mouth in the morning, at noon, and at bedtime.     levofloxacin (LEVAQUIN) 500 MG tablet Take 1 tablet (500 mg total) by mouth daily for 7 days. 7 tablet 0   meloxicam (MOBIC) 15 MG tablet Take 1 tablet (15 mg total) by mouth daily. 30 tablet 3   morphine (MSIR) 15 MG tablet 1/2 -1 pill every 8- 12 hours as needed. (Patient taking differently: Take 7.5-15 mg by mouth every 8 (eight) hours as needed for moderate pain. 1/2 -1 pill every 8- 12 hours as needed.) 45 tablet 0   ondansetron (ZOFRAN) 8 MG tablet One pill every 8 hours as needed for nausea/vomitting. (Patient taking differently: Take 8 mg by mouth every 8 (eight) hours as  needed for nausea or vomiting. One pill every 8 hours as needed for nausea/vomitting.) 40 tablet 1   sucralfate (CARAFATE) 1 g tablet Take 1 tablet (1 g total) by mouth 3 (three) times daily. Dissolve tablet in 3to 4 tables spoons warm water swish and swallow 90 tablet 1   amLODipine (NORVASC) 2.5 MG tablet Take 2.5 mg by mouth daily.  (Patient not taking: Reported on 01/19/2023)     lansoprazole (PREVACID) 30 MG capsule Take 1 capsule (30 mg total) by mouth daily at 12 noon. (Patient not taking: Reported on 01/23/2023) 30 capsule 1   triamterene-hydrochlorothiazide (DYAZIDE) 37.5-25 MG capsule Take 1 capsule by mouth daily.  (Patient not taking: Reported on 01/19/2023)  1   Pertinent GI related medications were reviewed with the patient  Inpatient Medications  Current Facility-Administered Medications:    0.9 %  sodium chloride infusion (Manually program via Guardrails IV Fluids), , Intravenous, Once, Manuela Schwartz, NP   0.9 %  sodium chloride infusion (Manually program via Guardrails IV Fluids), , Intravenous, Once, Darlin Priestly, MD   0.9 %  sodium chloride infusion, 250 mL, Intravenous, Continuous, Chesley Noon, MD, Stopped at 01/23/23  1424   ascorbic acid (VITAMIN C) tablet 500 mg, 500 mg, Oral, BID, Darlin Priestly, MD, 500 mg at 01/24/23 2209   ceFEPIme (MAXIPIME) 2 g in sodium chloride 0.9 % 100 mL IVPB, 2 g, Intravenous, Q12H, Paliwal, Aditya, MD, Last Rate: 200 mL/hr at 01/25/23 0109, 2 g at 01/25/23 0109   Chlorhexidine Gluconate Cloth 2 % PADS 6 each, 6 each, Topical, QHS, Dgayli, Lianne Bushy, MD, 6 each at 01/23/23 2210   docusate sodium (COLACE) capsule 100 mg, 100 mg, Oral, BID PRN, Ezequiel Essex, NP   erythromycin 250 mg in sodium chloride 0.9 % 100 mL IVPB, 250 mg, Intravenous, NOW, Jaynie Collins, DO   feeding supplement (ENSURE ENLIVE / ENSURE PLUS) liquid 237 mL, 237 mL, Oral, TID BM, Darlin Priestly, MD   hydrocortisone sodium succinate (SOLU-CORTEF) 100 MG injection 100 mg, 100 mg, Intravenous, Q12H, Dgayli, Khabib, MD, 100 mg at 01/25/23 0828   ipratropium-albuterol (DUONEB) 0.5-2.5 (3) MG/3ML nebulizer solution 3 mL, 3 mL, Nebulization, Q6H PRN, Ezequiel Essex, NP   ipratropium-albuterol (DUONEB) 0.5-2.5 (3) MG/3ML nebulizer solution 3 mL, 3 mL, Nebulization, Q6H, Darlin Priestly, MD, 3 mL at 01/25/23 0740   midodrine (PROAMATINE) tablet 10 mg, 10 mg, Oral, TID WC, Darlin Priestly, MD   morphine (MSIR) tablet 7.5 mg, 7.5 mg, Oral, Q8H PRN, Rust-Chester, Britton L, NP, 7.5 mg at 01/23/23 2257   multivitamin with minerals tablet 1 tablet, 1 tablet, Oral, Daily, Darlin Priestly, MD   ondansetron [facility]) injection 4 mg, 4 mg, Intravenous, Q6H PRN, Ezequiel Essex, NP, 4 mg at 01/25/23 0746   pantoprazole (PROTONIX) injection 40 mg, 40 mg, Intravenous, Q12H, Manuela Schwartz, NP, 40 mg at 01/25/23 0747   polyethylene glycol (MIRALAX / GLYCOLAX) packet 17 g, 17 g, Oral, Daily PRN, Ezequiel Essex, NP   potassium chloride 10 mEq in 100 mL IVPB, 10 mEq, Intravenous, Q1 Hr x 4, Darlin Priestly, MD   sodium chloride 0.9 % 1,000 mL with potassium chloride 80 mEq infusion, , Intravenous, Continuous, Darlin Priestly, MD, Last Rate: 100 mL/hr at 01/25/23 0950, New  Bag at 01/25/23 0950   vancomycin (VANCOREADY) IVPB 750 mg/150 mL, 750 mg, Intravenous, Q24H, Nazari, Walid A, RPH, Stopped at 01/24/23 1823  sodium chloride Stopped (01/23/23 1424)   ceFEPime (MAXIPIME) IV 2 g (01/25/23 0109)  erythromycin     potassium chloride     sodium chloride 0.9 % 1,000 mL with potassium chloride 80 mEq infusion 100 mL/hr at 01/25/23 0950   vancomycin Stopped (01/24/23 1823)    docusate sodium, ipratropium-albuterol, morphine, ondansetron (ZOFRAN) IV, polyethylene glycol   Objective   Vitals:   01/25/23 0913 01/25/23 0928 01/25/23 1030 01/25/23 1158  BP: (!) 90/58 (!) 109/45 (!) 123/49 136/63  Pulse: (!) 120 (!) 128 (!) 124 (!) 113  Resp: 20 20 20 20   Temp: 98.2 F (36.8 C) 98.2 F (36.8 C) 98.2 F (36.8 C) 98.4 F (36.9 C)  TempSrc: Oral Oral Oral Oral  SpO2: 94% 94% 94% 95%  Weight:    59 kg  Height:    5' (1.524 m)     Physical Exam Vitals and nursing note reviewed.  Constitutional:      General: She is in acute distress.     Appearance: Normal appearance. She is ill-appearing. She is not toxic-appearing or diaphoretic.  HENT:     Head: Normocephalic and atraumatic.     Nose: Nose normal.     Mouth/Throat:     Mouth: Mucous membranes are moist.     Pharynx: Oropharynx is clear.  Eyes:     General: No scleral icterus.    Extraocular Movements: Extraocular movements intact.  Cardiovascular:     Rate and Rhythm: Regular rhythm. Tachycardia present.  Pulmonary:     Breath sounds: No wheezing, rhonchi or rales.     Comments: Diminished bilaterally Abdominal:     General: Bowel sounds are normal. There is no distension.     Palpations: Abdomen is soft.     Tenderness: There is abdominal tenderness (Tender across the upper abdomen.  Nonperitoneal nonrigid). There is no guarding or rebound.  Musculoskeletal:     Cervical back: Neck supple.     Right lower leg: No edema.     Left lower leg: No edema.  Skin:    General: Skin is warm and  dry.     Coloration: Skin is not jaundiced or pale.  Neurological:     General: No focal deficit present.     Mental Status: She is alert and oriented to person, place, and time. Mental status is at baseline.     Comments: Patient cognition is intact  Psychiatric:        Mood and Affect: Mood normal.        Behavior: Behavior normal.        Thought Content: Thought content normal.        Judgment: Judgment normal.     Laboratory Data Recent Labs  Lab 01/24/23 0502 01/25/23 0529 01/25/23 0552  WBC 5.4 8.7 7.5  HGB 7.9* 6.8* 6.7*  HCT 24.6* 21.8* 21.0*  PLT 298 328 308   Recent Labs  Lab 01/23/23 1212 01/24/23 0502 01/24/23 0855 01/25/23 0552  NA 136 139  --  142  K 4.3 3.2*  --  2.8*  CL 98 107  --  110  CO2 23 23  --  19*  BUN 29* 28*  --  33*  CALCIUM 8.6* 7.5*  --  7.4*  PROT 5.6*  --  5.2* 4.8*  BILITOT 1.0  --  1.2 0.9  ALKPHOS 59  --  50 49  ALT 24  --  21 19  AST 44*  --  41 46*  GLUCOSE 90 133*  --  160*   Recent Labs  Lab 01/23/23 1212  INR 1.1    No results for input(s): "LIPASE" in the last 72 hours.      Imaging Studies: DG Chest Port 1 View  Result Date: 01/25/2023 CLINICAL DATA:  Tachycardia EXAM: PORTABLE CHEST 1 VIEW COMPARISON:  Yesterday FINDINGS: Airspace disease at the upper lobes greater on the right. Haziness at the right base is from pleural fluid by CT. Normal heart size. Artifact from EKG leads. IMPRESSION: Biapical pneumonia that is worse on the right, stable from prior. Electronically Signed   By: Tiburcio Pea M.D.   On: 01/25/2023 05:03   US THORACENTESIS ASP PLEURAL SPACE W/IMG GUIDE  Result Date: 01/24/2023 INDICATION: Parapneumonic pleural effusion EXAM: ULTRASOUND GUIDED RIGHT THORACENTESIS MEDICATIONS: None. COMPLICATIONS: None immediate. PROCEDURE: An ultrasound guided thoracentesis was thoroughly discussed with the patient and questions answered. The benefits, risks, alternatives and complications were also discussed. The  patient understands and wishes to proceed with the procedure. Written consent was obtained. Ultrasound was performed to localize and mark an adequate pocket of fluid in the right chest. The area was then prepped and draped in the normal sterile fashion. 1% Lidocaine was used for local anesthesia. Under ultrasound guidance a 19 gauge, 7-cm, Yueh catheter was introduced. Thoracentesis was performed. The catheter was removed and a dressing applied. FINDINGS: A total of approximately 330 ml of clear yellow fluid was removed. Samples were sent to the laboratory as requested by the clinical team. IMPRESSION: Successful ultrasound guided right thoracentesis yielding 330 mL of pleural fluid. Electronically Signed   By: Olive Bass M.D.   On: 01/24/2023 14:23   DG Chest Port 1 View  Result Date: 01/24/2023 CLINICAL DATA:  Pleural effusion post right thoracentesis. EXAM: PORTABLE CHEST 1 VIEW COMPARISON:  01/23/2023 FINDINGS: Heart size and pulmonary vascularity are normal. No significant pleural effusion or pneumothorax identified. Bilateral upper lobe airspace consolidation with volume loss. Changes may represent pneumonia or postobstructive process. Left upper lung infiltrates are progressing since the previous study. Surgical clips in the right upper quadrant. IMPRESSION: 1. No significant pleural effusion or pneumothorax identified. 2. Bilateral upper lobe airspace consolidation, progressing on the left since prior study. Electronically Signed   By: Burman Nieves M.D.   On: 01/24/2023 12:05   CT Chest W Contrast  Result Date: 01/23/2023 CLINICAL DATA:  Sepsis; stage IV lung malignancy; * Tracking Code: BO * EXAM: CT CHEST WITH CONTRAST TECHNIQUE: Multidetector CT imaging of the chest was performed during intravenous contrast administration. RADIATION DOSE REDUCTION: This exam was performed according to the departmental dose-optimization program which includes automated exposure control, adjustment of the mA  and/or kV according to patient size and/or use of iterative reconstruction technique. CONTRAST:  75mL OMNIPAQUE IOHEXOL 300 MG/ML  SOLN COMPARISON:  PET-CT dated Feb 03, 2023 FINDINGS: Cardiovascular: Normal heart size. No pericardial effusion. Normal caliber thoracic aorta with severe atherosclerotic disease. Mediastinum/Nodes: Small to moderate hiatal hernia. Enlarged mediastinal and left supraclavicular lymph nodes, unchanged when compared with the prior exam. Reference AP window lymph node measuring 2.1 x 1.8 cm on series 2, image 48, unchanged when compared with the prior exam. Lungs/Pleura: Central airways are patent. Similar left apical mass with associated pleural thickening. New large right upper lobe consolidation and left upper lobe ground-glass opacities. New multiple solid pulmonary nodules. Reference new solid pulmonary nodule of the left lower lobe measuring 1.7 x 1.3 cm. Similar nodular ground-glass opacities of the left upper lobe. New small right-greater-than-left pleural effusions and bibasilar atelectasis. Upper Abdomen: Moderate sized fat containing right  Bochdalek hernia. Prior cholecystectomy. Partially visualized simple appearing cyst of the right kidney. Musculoskeletal: Old anterolateral right-sided rib fractures. No aggressive appearing osseous lesions. IMPRESSION: 1. New large right upper lobe consolidation and left upper lobe ground-glass opacities, findings are likely due to pneumonia. 2. New small right-greater-than-left pleural effusions and bibasilar atelectasis. 3. Similar left apical mass with associated pleural thickening. 4. Multiple new large solid pulmonary nodules, concerning for progressive metastatic disease, although findings could also be infectious. Recommend short-term follow-up 1-2 months for further evaluation. 5. Stable mediastinal and left supraclavicular lymphadenopathy. 6. Aortic Atherosclerosis (ICD10-I70.0). Electronically Signed   By: Allegra Lai M.D.   On:  01/23/2023 16:03   DG Chest Portable 1 View  Result Date: 01/23/2023 CLINICAL DATA:  Fever EXAM: PORTABLE CHEST 1 VIEW COMPARISON:  Radiograph 01/13/2023 FINDINGS: Unchanged cardiomediastinal silhouette. There is confluent right upper lobe airspace disease and patchy airspace opacities in the mid to lower lungs. Trace pleural effusions. Unchanged left apical mass and pleural thickening. No evidence of pneumothorax. No acute osseous abnormality. IMPRESSION: Confluent right upper lobe airspace disease and patchy airspace opacities in the mid to lower lungs, concerning for multifocal pneumonia. Trace pleural effusions. Unchanged left apical mass and pleural thickening. Electronically Signed   By: Caprice Renshaw M.D.   On: 01/23/2023 13:20    Assessment:   # Suspected ugi bleeding - pt with hematemesis and clots in stool - hgb down to 6.7 from 12 on 4/27 -bun/cr 33/0.84 - receiving 2 u prbc on 5/9 - hypotension, tachycardia  # sepsis 2/2 pna  # Acute on chronic hypoxic respiratory failure  #Stage IV lung cancer with metastasis to the bone on Keytruda  Plan:  I discussed the indications for upper endoscopy with the patient including the risks and benefits.  I also discussed the risks and benefits of not undergoing endoscopy including but not limited to death and worsening of bleeding.  At this point in time the patient declines undergoing upper endoscopy.  I discussed this with her son Orvilla Fus via phone for over 18 minutes as well.  He attempted to convince the patient via phone also after our conversation, however, the patient still declines  Continue 40 mg IV PPI twice daily Maintain n.p.o. status in case patient changes her mind Provide erythromycin for gastric clearance  Receiving 2 units PRBCs family hospitalist team  Hold dvt ppx Monitor H&H.  Transfusion and resuscitation as per primary team Pressors as per primary team Avoid frequent lab draws to prevent lab induced anemia Supportive  care and antiemetics as per primary team Maintain two sites IV access Avoid nsaids Monitor for GIB.  GI will be available on standby should the patient change her mind about EGD. At this time, only conservative measures such as ppi and resuscitation are available due to the patent's declining endoscopy.  CTA can be used to evaluate for active bleed- but if patient not willing to undergo endoscopy or vascular surgery intervention/embolization, then test would not be useful  >55 minutes were spent in chart review, patient evaluation, coordination of care, and discussion of care with her son on the phone  I personally performed the service.  Management of other medical comorbidities as per primary team  Thank you for allowing [place] to participate in this patient's care. Please don't hesitate to call if any questions or concerns arise.   Jaynie Collins, DO [facility]  Portions of the record may have been created with voice recognition software. Occasional wrong-word or 'sound-a-like'  substitutions may have occurred due to the inherent limitations of voice recognition software.  Read the chart carefully and recognize, using context, where substitutions may have occurred.

## 2023-01-26 ENCOUNTER — Ambulatory Visit
Admission: RE | Admit: 2023-01-26 | Discharge: 2023-01-26 | Disposition: A | Discharge status: Home / Self Care | Payer: Medicare Other | Source: Ambulatory Visit | Attending: Internal Medicine | Admitting: Internal Medicine

## 2023-01-26 ENCOUNTER — Inpatient Hospital Stay: Payer: Medicare Other | Admitting: Hospice and Palliative Medicine

## 2023-01-26 DIAGNOSIS — A419 Sepsis, unspecified organism: Secondary | ICD-10-CM | POA: Diagnosis not present

## 2023-01-26 DIAGNOSIS — Z720 Tobacco use: Secondary | ICD-10-CM | POA: Diagnosis not present

## 2023-01-26 DIAGNOSIS — C50919 Malignant neoplasm of unspecified site of unspecified female breast: Secondary | ICD-10-CM | POA: Diagnosis not present

## 2023-01-26 DIAGNOSIS — I63522 Cerebral infarction due to unspecified occlusion or stenosis of left anterior cerebral artery: Secondary | ICD-10-CM

## 2023-01-26 DIAGNOSIS — C3412 Malignant neoplasm of upper lobe, left bronchus or lung: Secondary | ICD-10-CM

## 2023-01-26 DIAGNOSIS — I639 Cerebral infarction, unspecified: Secondary | ICD-10-CM | POA: Insufficient documentation

## 2023-01-26 DIAGNOSIS — I10 Essential (primary) hypertension: Secondary | ICD-10-CM | POA: Diagnosis not present

## 2023-01-26 LAB — BPAM RBC
Blood Product Expiration Date: 202406052359
ISSUE DATE / TIME: 202405090910
Unit Type and Rh: 1700

## 2023-01-26 LAB — CBC
HCT: 26.6 % — ABNORMAL LOW (ref 36.0–46.0)
Hemoglobin: 9 g/dL — ABNORMAL LOW (ref 12.0–15.0)
MCH: 30.6 pg (ref 26.0–34.0)
MCHC: 33.8 g/dL (ref 30.0–36.0)
MCV: 90.5 fL (ref 80.0–100.0)
Platelets: 162 10*3/uL (ref 150–400)
RBC: 2.94 MIL/uL — ABNORMAL LOW (ref 3.87–5.11)
RDW: 15.2 % (ref 11.5–15.5)
WBC: 9.8 10*3/uL (ref 4.0–10.5)
nRBC: 1 % — ABNORMAL HIGH (ref 0.0–0.2)

## 2023-01-26 LAB — BASIC METABOLIC PANEL
Anion gap: 9 (ref 5–15)
BUN: 36 mg/dL — ABNORMAL HIGH (ref 8–23)
CO2: 18 mmol/L — ABNORMAL LOW (ref 22–32)
Calcium: 7 mg/dL — ABNORMAL LOW (ref 8.9–10.3)
Chloride: 119 mmol/L — ABNORMAL HIGH (ref 98–111)
Creatinine, Ser: 0.79 mg/dL (ref 0.44–1.00)
GFR, Estimated: >60 mL/min (ref >60)
Glucose, Bld: 125 mg/dL — ABNORMAL HIGH (ref 70–99)
Potassium: 3.9 mmol/L (ref 3.5–5.1)
Sodium: 146 mmol/L — ABNORMAL HIGH (ref 135–145)

## 2023-01-26 LAB — ECHOCARDIOGRAM COMPLETE
Height: 60 in
S' Lateral: 2.7 cm
Weight: 2042.34 oz

## 2023-01-26 LAB — TYPE AND SCREEN
Antibody Screen: NEGATIVE
Unit division: 0
Unit division: 0

## 2023-01-26 LAB — MAGNESIUM: Magnesium: 2.1 mg/dL (ref 1.7–2.4)

## 2023-01-26 LAB — CULTURE, BLOOD (ROUTINE X 2)

## 2023-01-26 LAB — T3, FREE: T3, Free: 0.9 pg/mL — ABNORMAL LOW (ref 2.0–4.4)

## 2023-01-26 MED ORDER — MIDODRINE HCL 5 MG PO TABS: 5.0000 mg | ORAL_TABLET | Freq: Three times a day (TID) | ORAL | Status: DC

## 2023-01-26 MED ORDER — GABAPENTIN 300 MG PO CAPS
300.0000 mg | ORAL_CAPSULE | Freq: Three times a day (TID) | ORAL | Status: DC
  Administered 2023-01-26 – 2023-02-07 (×35): 300 mg via ORAL
  Filled 2023-01-26 (×37): qty 1

## 2023-01-26 MED ORDER — CITALOPRAM HYDROBROMIDE 20 MG PO TABS
10.0000 mg | ORAL_TABLET | Freq: Every day | ORAL | Status: DC
  Administered 2023-01-26 – 2023-02-02 (×7): 10 mg via ORAL
  Filled 2023-01-26 (×8): qty 1

## 2023-01-26 MED ORDER — PREDNISONE 20 MG PO TABS
40.0000 mg | ORAL_TABLET | Freq: Every day | ORAL | Status: DC
  Administered 2023-01-27 – 2023-02-01 (×6): 40 mg via ORAL
  Filled 2023-01-26: qty 4
  Filled 2023-01-26 (×4): qty 2
  Filled 2023-01-26: qty 4

## 2023-01-26 MED ORDER — IPRATROPIUM-ALBUTEROL 0.5-2.5 (3) MG/3ML IN SOLN
3.0000 mL | Freq: Three times a day (TID) | RESPIRATORY_TRACT | Status: DC
  Administered 2023-01-27: 3 mL via RESPIRATORY_TRACT
  Filled 2023-01-26: qty 3

## 2023-01-26 NOTE — Progress Notes (Addendum)
[facility] Gastroenterology Inpatient Progress Note  Subjective: Patient had a large duodenal bulb ulcer just posterior to the pylori-not actively bleeding. Hematin throughout the stomach. See post op GI note for details. She remains in ICU and has been stable over night. She has no appetite and has taken her pills with water. She has not wanted clear liquid diet. No nausea/vomiting . Denies abdominal pain.VSS.  No further BM overnight. She has stable Hgb 9.7-9.0 reflecting appropriate equilibration after on u PRBC transfusion.     Objective: Vital signs in last 24 hours: Temp:  [96.7 F (35.9 C)-98.4 F (36.9 C)] 97.9 F (36.6 C) (05/10 0800) Pulse Rate:  [87-124] 89 (05/10 1000) Resp:  [16-36] 20 (05/10 1000) BP: (96-158)/(46-75) 142/72 (05/10 0900) SpO2:  [90 %-100 %] 91 % (05/10 1000) FiO2 (%):  [36 %] 36 % (05/10 0239) Weight:  [57.9 kg-59 kg] 57.9 kg (05/10 0500) Blood pressure (!) 142/72, pulse 89, temperature 97.9 F (36.6 C), temperature source Axillary, resp. rate 20, height 5' (1.524 m), weight 57.9 kg, SpO2 91 %.    Intake/Output from previous day: 05/09 0701 - 05/10 0700 In: 2518.4 [I.V.:1402; Blood:315; IV Piggyback:801.4] Out: 250 [Urine:250]  Intake/Output this shift: Total I/O In: 20 [I.V.:20] Out: -    Gen: Drowsy. Opens eyes with loud voice calling her name. NAD. Appears comfortable. Minimal interaction. Answers questions appropriately.  Chest: CTA, no wheezes.  CV: RR nl S1, S2. No gallops.  Abd: soft, nt, nd. BS+  Ext: no edema  Lab Results: Results for orders placed or performed during the hospital encounter of 01/23/23 (from the past 24 hour(s))  Prepare RBC (crossmatch)     Status: None   Collection Time: 01/25/23 11:29 AM  Result Value Ref Range   Order Confirmation      ORDER PROCESSED BY BLOOD BANK Performed at [facility], [address] [place], [place] [address]   Glucose, capillary     Status: Abnormal   Collection  Time: 01/25/23 11:55 AM  Result Value Ref Range   Glucose-Capillary 138 (H) 70 - 99 mg/dL  CBC     Status: Abnormal   Collection Time: 01/25/23  5:59 PM  Result Value Ref Range   WBC 7.8 4.0 - 10.5 K/uL   RBC 3.15 (L) 3.87 - 5.11 MIL/uL   Hemoglobin 9.7 (L) 12.0 - 15.0 g/dL   HCT 60.4 (L) 54.0 - 98.1 %   MCV 88.6 80.0 - 100.0 fL   MCH 30.8 26.0 - 34.0 pg   MCHC 34.8 30.0 - 36.0 g/dL   RDW 19.1 47.8 - 29.5 %   Platelets 183 150 - 400 K/uL   nRBC 1.4 (H) 0.0 - 0.2 %  CBC     Status: Abnormal   Collection Time: 01/26/23  5:00 AM  Result Value Ref Range   WBC 9.8 4.0 - 10.5 K/uL   RBC 2.94 (L) 3.87 - 5.11 MIL/uL   Hemoglobin 9.0 (L) 12.0 - 15.0 g/dL   HCT 62.1 (L) 30.8 - 65.7 %   MCV 90.5 80.0 - 100.0 fL   MCH 30.6 26.0 - 34.0 pg   MCHC 33.8 30.0 - 36.0 g/dL   RDW 84.6 96.2 - 95.2 %   Platelets 162 150 - 400 K/uL   nRBC 1.0 (H) 0.0 - 0.2 %  Magnesium     Status: None   Collection Time: 01/26/23  5:00 AM  Result Value Ref Range   Magnesium 2.1 1.7 - 2.4 mg/dL  Basic  metabolic panel     Status: Abnormal   Collection Time: 01/26/23  5:00 AM  Result Value Ref Range   Sodium 146 (H) 135 - 145 mmol/L   Potassium 3.9 3.5 - 5.1 mmol/L   Chloride 119 (H) 98 - 111 mmol/L   CO2 18 (L) 22 - 32 mmol/L   Glucose, Bld 125 (H) 70 - 99 mg/dL   BUN 36 (H) 8 - 23 mg/dL   Creatinine, Ser 1.61 0.44 - 1.00 mg/dL   Calcium 7.0 (L) 8.9 - 10.3 mg/dL   GFR, Estimated >09 >60 mL/min   Anion gap 9 5 - 15     Recent Labs    01/25/23 0552 01/25/23 1759 01/26/23 0500  WBC 7.5 7.8 9.8  HGB 6.7* 9.7* 9.0*  HCT 21.0* 27.9* 26.6*  PLT 308 183 162   BMET Recent Labs    01/24/23 0502 01/25/23 0552 01/26/23 0500  NA 139 142 146*  K 3.2* 2.8* 3.9  CL 107 110 119*  CO2 23 19* 18*  GLUCOSE 133* 160* 125*  BUN 28* 33* 36*  CREATININE 0.72 0.84 0.79  CALCIUM 7.5* 7.4* 7.0*   LFT Recent Labs    01/24/23 0855 01/25/23 0552  PROT 5.2* 4.8*  ALBUMIN 2.0* 1.8*  AST 41 46*  ALT 21 19   ALKPHOS 50 49  BILITOT 1.2 0.9  BILIDIR 0.2  --   IBILI 1.0*  --    PT/INR Recent Labs    01/23/23 1212  LABPROT 14.8  INR 1.1   Hepatitis Panel No results for input(s): "HEPBSAG", "HCVAB", "HEPAIGM", "HEPBIGM" in the last 72 hours. C-Diff No results for input(s): "CDIFFTOX" in the last 72 hours. No results for input(s): "CDIFFPCR" in the last 72 hours.   Studies/Results: DG Chest Port 1 View  Result Date: 01/25/2023 CLINICAL DATA:  Tachycardia EXAM: PORTABLE CHEST 1 VIEW COMPARISON:  Yesterday FINDINGS: Airspace disease at the upper lobes greater on the right. Haziness at the right base is from pleural fluid by CT. Normal heart size. Artifact from EKG leads. IMPRESSION: Biapical pneumonia that is worse on the right, stable from prior. Electronically Signed   By: Tiburcio Pea M.D.   On: 01/25/2023 05:03   US THORACENTESIS ASP PLEURAL SPACE W/IMG GUIDE  Result Date: 01/24/2023 INDICATION: Parapneumonic pleural effusion EXAM: ULTRASOUND GUIDED RIGHT THORACENTESIS MEDICATIONS: None. COMPLICATIONS: None immediate. PROCEDURE: An ultrasound guided thoracentesis was thoroughly discussed with the patient and questions answered. The benefits, risks, alternatives and complications were also discussed. The patient understands and wishes to proceed with the procedure. Written consent was obtained. Ultrasound was performed to localize and mark an adequate pocket of fluid in the right chest. The area was then prepped and draped in the normal sterile fashion. 1% Lidocaine was used for local anesthesia. Under ultrasound guidance a 19 gauge, 7-cm, Yueh catheter was introduced. Thoracentesis was performed. The catheter was removed and a dressing applied. FINDINGS: A total of approximately 330 ml of clear yellow fluid was removed. Samples were sent to the laboratory as requested by the clinical team. IMPRESSION: Successful ultrasound guided right thoracentesis yielding 330 mL of pleural fluid.  Electronically Signed   By: Olive Bass M.D.   On: 01/24/2023 14:23   DG Chest Port 1 View  Result Date: 01/24/2023 CLINICAL DATA:  Pleural effusion post right thoracentesis. EXAM: PORTABLE CHEST 1 VIEW COMPARISON:  01/23/2023 FINDINGS: Heart size and pulmonary vascularity are normal. No significant pleural effusion or pneumothorax identified. Bilateral upper lobe airspace consolidation with  volume loss. Changes may represent pneumonia or postobstructive process. Left upper lung infiltrates are progressing since the previous study. Surgical clips in the right upper quadrant. IMPRESSION: 1. No significant pleural effusion or pneumothorax identified. 2. Bilateral upper lobe airspace consolidation, progressing on the left since prior study. Electronically Signed   By: Burman Nieves M.D.   On: 01/24/2023 12:05    Scheduled Inpatient Medications:    sodium chloride   Intravenous Once   vitamin C  500 mg Oral BID   Chlorhexidine Gluconate Cloth  6 each Topical QHS   feeding supplement  237 mL Oral TID BM   hydrocortisone sod succinate (SOLU-CORTEF) inj  100 mg Intravenous Q12H   ipratropium-albuterol  3 mL Nebulization Q6H   midodrine  10 mg Oral TID WC   multivitamin with minerals  1 tablet Oral Daily   pantoprazole (PROTONIX) IV  40 mg Intravenous Q12H    Continuous Inpatient Infusions:    sodium chloride 0 mL (01/23/23 1424)   ceFEPime (MAXIPIME) IV 2 g (01/26/23 1610)   vancomycin Stopped (01/25/23 1928)    PRN Inpatient Medications:  docusate sodium, ipratropium-albuterol, morphine, ondansetron (ZOFRAN) IV, polyethylene glycol  Assessment:  Acute blood loss anemia-stable after x1 PRBC  IDA UGI bleed from large duodenal bulb ulcer just posterior to the pylori-not actively bleeding. Hematin throughout the stomach.   Plan:  Continue 40 mg iv protonix q12 hours I would expect her to continue to pass some old blood and clot via her stool given the significance of her  bleed. Monitor H&H.  Transfusion and resuscitation as per primary team Avoid frequent lab draws to prevent lab induced anemia Supportive care and antiemetics as per primary team Maintain two sites IV access Avoid nsaids Monitor for GIB.  Should bleeding recur (20 to 40% chance) would recommend vascular surgery/IR intervention for embolization.   Rowan Blase, ANP @[facility] @, 10:28 AM

## 2023-01-26 NOTE — Progress Notes (Signed)
*  PRELIMINARY RESULTS* Echocardiogram 2D Echocardiogram has been performed.  Crystal Mcmahon 01/26/2023, 12:53 PM

## 2023-01-26 NOTE — Plan of Care (Signed)
Discussed with patient in front of daughter plan of care for the evening, pain management and medications with some teach back displayed.  Patient wants medication early tonight.  Problem: Education: Goal: Knowledge of General Education information will improve Description: Including pain rating scale, medication(s)/side effects and non-pharmacologic comfort measures Outcome: Progressing   Problem: Health Behavior/Discharge Planning: Goal: Ability to manage health-related needs will improve Outcome: Progressing

## 2023-01-26 NOTE — Progress Notes (Signed)
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  She presented to [facility] ER on 05/7 with fevers, tachycardia, and hypoxia.  At baseline pt wears 2L O2 via nasal canula.  She has been followed by oncology and received her first dose of keytruda on 01/22/23.  She was recently taken off her antihypertensives due to hypotension and s/p dex taper.    Upon arrival to the ER pts O2 sats were 84% on baseline O2@2L  via nasal canula requiring increase in O2 to 4L with improvement in oxygenation.  CXR concerning for pneumonia with trace pleural effusions and unchanged left apical mass.  Sepsis protocol initiated and pt received 1,750 ml iv fluid bolus, metronidazole, vancomycin, and cefepime.  Pt remained hypotensive requiring low dose levophed gtt.  PCCM team contacted for ICU admission.  Pt was off pressor and transferred to hospitalist service on 01/24/23.  #Septic and hypovolemic shock  --off pressor --taper off stress dose steroid  #Mildly elevated troponin likely secondary to demand ischemia in the setting of sepsis  Hypotension --BP low on presentation, now improved --d/c midodrine today   #Mild AKI secondary to ATN in the setting of hypotension  - Trend BMP  - Replace electrolytes as indicated  - Monitor UOP  - Avoid nephrotoxic medications    #Sepsis secondary to pneumonia  --started on vanc and cefepime --d/c Vanc today due to neg MRSA screen --cont cefepime for 5-day course   # acute blood loss anemia #Iron deficiency anemia  --coffee ground emesis and large melanotic stool on 5/9 --Hgb dropped from 8.8 on presentation to 6.7, received 2u pRBC. --monitor Hgb  # Upper GI bleed 2/2 duodenal ulcer #  gastritis --coffee ground emesis and large melanotic stool on 5/9 --EGD 5/9 found large ulcer just past the pylorus in the duodenum. Another ulcer in the pylorus. The culprit lesion was the duodenal ulcer which was treated.  --cont IV PPI BID --if bleeding continues, will need vascular embolization.    #Acute on chronic hypoxic respiratory failure  Chronic O2 @2L  O2 and Current Everyday Smoker --2/2 PNA in the setting of lung cancer --Continue supplemental O2 to keep sats between 88-92%, wean as tolerated  #Right pleural effusion 2/2 parapneumonic effusion --thoracentesis on 5/8, with 330 ml removed --fluid studies consistent with parapneumonic effusion   #Stage IV lung cancer with mets to the bone --onc palliative consult  Hypokalemia --monitor and replete with IV potassim    DVT prophylaxis: Lovenox SQ Code Status: DNR  Family Communication:  Level of care: Stepdown Dispo:   The patient is from: home Anticipated d/c is to: home Anticipated d/c date is: 2-3 days   Subjective and Interval History:  No acute event today.  All indices improving.  Pt was tired.   Objective: Vitals:   01/26/23 1431 01/26/23 1500 01/26/23 1600 01/26/23 1700  BP:  (!) 145/61 (!) 157/72 135/62  Pulse: 100 (!) 103 99 93  Resp: 18 (!) 21 (!) 21 18  Temp:   98.3 F (36.8 C)   TempSrc:   Oral   SpO2: 92% 92% (!) 88% (!) 89%  Weight:      Height:        Intake/Output  Summary (Last 24 hours) at 01/26/2023 1723 Last data filed at 01/26/2023 1700 Gross per 24 hour  Intake 1270.57 ml  Output 750 ml  Net 520.57 ml   Filed Weights   01/24/23 0500 01/25/23 1158 01/26/23 0500  Weight: 58.2 kg 59 kg 57.9 kg    Examination:   Constitutional: NAD, AAOx3 HEENT: conjunctivae and lids normal, EOMI CV: No cyanosis.   RESP: normal respiratory effort, on 3L Psych: subdued mood and affect.     Data Reviewed: I have personally reviewed labs and imaging studies  Time spent: 35 minutes, critical  care billing  Darlin Priestly, MD [facility] If 7PM-7AM, please contact night-coverage 01/26/2023, 5:23 PM

## 2023-01-27 ENCOUNTER — Inpatient Hospital Stay: Payer: Medicare Other

## 2023-01-27 DIAGNOSIS — A419 Sepsis, unspecified organism: Secondary | ICD-10-CM | POA: Diagnosis not present

## 2023-01-27 LAB — BASIC METABOLIC PANEL
Anion gap: 4 — ABNORMAL LOW (ref 5–15)
BUN: 30 mg/dL — ABNORMAL HIGH (ref 8–23)
CO2: 22 mmol/L (ref 22–32)
Calcium: 7.1 mg/dL — ABNORMAL LOW (ref 8.9–10.3)
Chloride: 122 mmol/L — ABNORMAL HIGH (ref 98–111)
Creatinine, Ser: 0.68 mg/dL (ref 0.44–1.00)
GFR, Estimated: >60 mL/min (ref >60)
Glucose, Bld: 94 mg/dL (ref 70–99)
Potassium: 2.9 mmol/L — ABNORMAL LOW (ref 3.5–5.1)
Sodium: 148 mmol/L — ABNORMAL HIGH (ref 135–145)

## 2023-01-27 LAB — MAGNESIUM: Magnesium: 2 mg/dL (ref 1.7–2.4)

## 2023-01-27 LAB — CBC
HCT: 26.7 % — ABNORMAL LOW (ref 36.0–46.0)
Hemoglobin: 9 g/dL — ABNORMAL LOW (ref 12.0–15.0)
MCH: 30.6 pg (ref 26.0–34.0)
MCHC: 33.7 g/dL (ref 30.0–36.0)
MCV: 90.8 fL (ref 80.0–100.0)
Platelets: 109 10*3/uL — ABNORMAL LOW (ref 150–400)
RBC: 2.94 MIL/uL — ABNORMAL LOW (ref 3.87–5.11)
RDW: 16 % — ABNORMAL HIGH (ref 11.5–15.5)
WBC: 11.3 10*3/uL — ABNORMAL HIGH (ref 4.0–10.5)
nRBC: 4.1 % — ABNORMAL HIGH (ref 0.0–0.2)

## 2023-01-27 LAB — POTASSIUM: Potassium: 3.9 mmol/L (ref 3.5–5.1)

## 2023-01-27 MED ORDER — POTASSIUM CHLORIDE CRYS ER 20 MEQ PO TBCR
40.0000 meq | EXTENDED_RELEASE_TABLET | Freq: Three times a day (TID) | ORAL | Status: DC
  Filled 2023-01-27: qty 2

## 2023-01-27 MED ORDER — IPRATROPIUM-ALBUTEROL 0.5-2.5 (3) MG/3ML IN SOLN
3.0000 mL | Freq: Two times a day (BID) | RESPIRATORY_TRACT | Status: DC
  Administered 2023-01-27 – 2023-01-31 (×9): 3 mL via RESPIRATORY_TRACT
  Filled 2023-01-27 (×10): qty 3

## 2023-01-27 MED ORDER — FUROSEMIDE 10 MG/ML IJ SOLN
40.0000 mg | Freq: Once | INTRAMUSCULAR | Status: AC
  Administered 2023-01-27: 40 mg via INTRAVENOUS
  Filled 2023-01-27: qty 4

## 2023-01-27 MED ORDER — CHLORHEXIDINE GLUCONATE CLOTH 2 % EX PADS
6.0000 | MEDICATED_PAD | Freq: Every day | CUTANEOUS | Status: DC
  Administered 2023-01-27 – 2023-02-07 (×9): 6 via TOPICAL

## 2023-01-27 MED ORDER — POTASSIUM CHLORIDE CRYS ER 20 MEQ PO TBCR
40.0000 meq | EXTENDED_RELEASE_TABLET | ORAL | Status: AC
  Administered 2023-01-27 (×2): 40 meq via ORAL
  Filled 2023-01-27: qty 2

## 2023-01-27 NOTE — Progress Notes (Addendum)
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  She presented to [facility] ER on 05/7 with fevers, tachycardia, and hypoxia.  At baseline pt wears 2L O2 via nasal canula.  She has been followed by oncology and received her first dose of keytruda on 01/22/23.  She was recently taken off her antihypertensives due to hypotension and s/p dex taper.    Upon arrival to the ER pts O2 sats were 84% on baseline O2@2L  via nasal canula requiring increase in O2 to 4L with improvement in oxygenation.  CXR concerning for pneumonia with trace pleural effusions and unchanged left apical mass.  Sepsis protocol initiated and pt received 1,750 ml iv fluid bolus, metronidazole, vancomycin, and cefepime.  Pt remained hypotensive requiring low dose levophed gtt.  PCCM team contacted for ICU admission.  Pt was off pressor and transferred to hospitalist service on 01/24/23.  #Septic and hypovolemic shock  --off pressor --taper off stress dose steroid with prednisone  #Mildly elevated troponin likely secondary to demand ischemia in the setting of sepsis  Hypotension --BP low on presentation, now improved.  midodrine d/c'ed.   #Mild AKI secondary to ATN in the setting of hypotension  - Trend BMP  - Replace electrolytes as indicated  - Monitor UOP  - Avoid nephrotoxic medications    #Sepsis secondary to pneumonia  --started on vanc and cefepime --d/c Vanc today due to neg MRSA screen --cont cefepime for 5-day course   # acute blood loss anemia #Iron deficiency anemia  --coffee ground emesis and large melanotic stool on 5/9 --Hgb dropped from 8.8 on presentation to 6.7, received 2u pRBC.  Hgb now stable around 9's. --monitor Hgb and transfuse to  keep Hgb >7  # Upper GI bleed 2/2 duodenal ulcer # gastritis --coffee ground emesis and large melanotic stool on 5/9 --EGD 5/9 found large ulcer just past the pylorus in the duodenum. Another ulcer in the pylorus. The culprit lesion was the duodenal ulcer which was treated.  --cont IV PPI BID --if bleeding continues, will need vascular embolization.    #Acute on chronic hypoxic respiratory failure  Chronic O2 @2L  O2 and Current Everyday Smoker --2/2 PNA in the setting of lung cancer --increased O2 requirement today to 7L.  Did get IVF in the past days. Plan: --CXR --trial IV lasix 40 x1 --Continue supplemental O2 to keep sats between 88-92%, wean as tolerated  #Right pleural effusion 2/2 parapneumonic effusion --thoracentesis on 5/8, with 330 ml removed --fluid studies consistent with parapneumonic effusion   #Stage IV lung cancer with mets to the bone --onc palliative consult  Hypokalemia --monitor and replete with IV potassim    DVT prophylaxis: Lovenox SQ Code Status: DNR  Family Communication: son updated at bedside today Level of care: Stepdown Dispo:   The patient is from: home Anticipated d/c is to: home Anticipated d/c date is: 2-3 days   Subjective and Interval History:  Pt was in better spirit today, more alert and talkative.  Pt reported breathing improved, however, having more O2 desat with increased O2 requirement.   Objective: Vitals:   01/27/23 0900 01/27/23 1000 01/27/23 1100 01/27/23 1200  BP: (!) 166/80 (!) 154/68 (!) 156/103 129/77  Pulse: Marland Kitchen)  116 (!) 110 (!) 109 (!) 108  Resp: (!) 26 (!) 21 (!) 21 20  Temp: 98.5 F (36.9 C) 98.8 F (37.1 C)  98.5 F (36.9 C)  TempSrc: Oral Oral  Oral  SpO2: 90% (!) 88% 90% 91%  Weight:      Height:        Intake/Output Summary (Last 24 hours) at 01/27/2023 1642 Last data filed at 01/27/2023 1200 Gross per 24 hour  Intake 500 ml  Output 850 ml  Net -350 ml   Filed Weights   01/25/23 1158 01/26/23  0500 01/27/23 0500  Weight: 59 kg 57.9 kg 46.8 kg    Examination:   Constitutional: NAD, AAOx3 HEENT: conjunctivae and lids normal, EOMI CV: No cyanosis.   RESP: normal respiratory effort, on 7L Extremities: No effusions, edema in BLE SKIN: warm, dry Neuro: II - XII grossly intact.   Psych: Normal mood and affect.  Appropriate judgement and reason   Data Reviewed: I have personally reviewed labs and imaging studies  Time spent: 35 minutes   Darlin Priestly, MD [facility] If 7PM-7AM, please contact night-coverage 01/27/2023, 4:42 PM

## 2023-01-27 NOTE — Consult Note (Signed)
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   Goal of Therapy:  Electrolytes with normal limits  Plan:  Potassium wnl at 3.9, no additional replacement ordered at this time. Follow-up BMP and Magnesium 5/12 with AM labs  Prince Solian III, PharmD 01/27/2023 8:34 PM

## 2023-01-27 NOTE — Progress Notes (Addendum)
PT Cancellation Note  Patient Details Name: Crystal Mcmahon MRN: [id number] DOB: 1941/08/30   Cancelled Treatment:    Reason Eval/Treat Not Completed: Other (comment). Per RN, pt with heavy exertion while taking morning meds with quick desat to low 80s%. Will let patient recover and re-attempt mobility evaluation.  Addendum: Pt with low O2 sats, pending chest x-Moselle Rister. Will re-attempt.   Stephonie Wilcoxen 01/27/2023, 9:18 AM Elizabeth Palau, PT, DPT, GCS [phone]

## 2023-01-27 NOTE — Consult Note (Signed)
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   Goal of Therapy:  Electrolytes with normal limits  Plan:  K 2.9 today with all other electrolytes stable, Scr remains below 1 and Na slightly elevated since admission. Kcl po q 4hrs x 2 doses given today 5/11. Will repeat K level tonight before any additional replacement.  Crystal Mcmahon PharmD, BCPS 01/27/2023 7:30 PM

## 2023-01-28 DIAGNOSIS — A419 Sepsis, unspecified organism: Secondary | ICD-10-CM | POA: Diagnosis not present

## 2023-01-28 LAB — BASIC METABOLIC PANEL
Anion gap: 6 (ref 5–15)
BUN: 34 mg/dL — ABNORMAL HIGH (ref 8–23)
CO2: 24 mmol/L (ref 22–32)
Calcium: 7.2 mg/dL — ABNORMAL LOW (ref 8.9–10.3)
Chloride: 120 mmol/L — ABNORMAL HIGH (ref 98–111)
Creatinine, Ser: 0.75 mg/dL (ref 0.44–1.00)
GFR, Estimated: >60 mL/min (ref >60)
Glucose, Bld: 116 mg/dL — ABNORMAL HIGH (ref 70–99)
Potassium: 3.6 mmol/L (ref 3.5–5.1)
Sodium: 150 mmol/L — ABNORMAL HIGH (ref 135–145)

## 2023-01-28 LAB — MAGNESIUM: Magnesium: 2 mg/dL (ref 1.7–2.4)

## 2023-01-28 LAB — CBC
HCT: 25.5 % — ABNORMAL LOW (ref 36.0–46.0)
Hemoglobin: 8.6 g/dL — ABNORMAL LOW (ref 12.0–15.0)
MCH: 31.4 pg (ref 26.0–34.0)
MCHC: 33.7 g/dL (ref 30.0–36.0)
MCV: 93.1 fL (ref 80.0–100.0)
Platelets: 78 10*3/uL — ABNORMAL LOW (ref 150–400)
RBC: 2.74 MIL/uL — ABNORMAL LOW (ref 3.87–5.11)
RDW: 16.3 % — ABNORMAL HIGH (ref 11.5–15.5)
WBC: 9.8 10*3/uL (ref 4.0–10.5)
nRBC: 5.6 % — ABNORMAL HIGH (ref 0.0–0.2)

## 2023-01-28 MED ORDER — PREDNISONE 10 MG PO TABS: 10.0000 mg | ORAL_TABLET | Freq: Every day | ORAL | Status: DC

## 2023-01-28 MED ORDER — FUROSEMIDE 10 MG/ML IJ SOLN
40.0000 mg | Freq: Every day | INTRAMUSCULAR | Status: DC
  Administered 2023-01-28 – 2023-01-31 (×4): 40 mg via INTRAVENOUS
  Filled 2023-01-28 (×4): qty 4

## 2023-01-28 MED ORDER — SODIUM CHLORIDE 0.45 % IV SOLN: INTRAVENOUS | Status: AC

## 2023-01-28 MED ORDER — PREDNISONE 20 MG PO TABS: 30.0000 mg | ORAL_TABLET | Freq: Every day | ORAL | Status: DC

## 2023-01-28 MED ORDER — SODIUM CHLORIDE 0.9 % IV SOLN
500.0000 mg | INTRAVENOUS | Status: AC
  Administered 2023-01-28 – 2023-01-30 (×3): 500 mg via INTRAVENOUS
  Filled 2023-01-28 (×3): qty 5

## 2023-01-28 MED ORDER — PREDNISONE 20 MG PO TABS: 20.0000 mg | ORAL_TABLET | Freq: Every day | ORAL | Status: DC

## 2023-01-28 NOTE — Evaluation (Signed)
Physical Therapy Evaluation Patient Details Name: Crystal Mcmahon MRN: [id number] DOB: 07/28/1941 Today's Date: 01/28/2023  History of Present Illness  Pt admitted for sepsis with complaints of fever, hypoxia, and tachy. 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  Clinical Impression  Pt is a pleasant 82 year old female who was admitted for sepsis. Pt performs bed mobility with mod assist and able to sit at EOB. Reports no dizziness. Feels unable to further perform transfers/ambulation at this time. Vitals monitored with increased in HR to 125bpm with sitting at O2 sats decreasing to 83% on 5L of O2. Slight increased in SOB symptoms and pt self returns back to bed. Uses bed functions to slide up to [facility]. Pt demonstrates deficits with strength/mobility/balance. Pt reports she is very hungry and looking forward to breakfast. Would benefit from skilled PT to address above deficits and promote optimal return to PLOF. Pt will continue to receive skilled PT services while admitted and will defer to TOC/care team for updates regarding disposition planning.      Recommendations for follow up therapy are one component of a multi-disciplinary discharge planning process, led by the attending physician.  Recommendations may be updated based on patient status, additional functional criteria and insurance authorization.  Follow Up Recommendations Can patient physically be transported by private vehicle: No     Assistance Recommended at Discharge Frequent or constant Supervision/Assistance  Patient can return home with the following  Two people to help with walking and/or transfers;A lot of help with bathing/dressing/bathroom;Help with stairs or ramp for entrance    Equipment Recommendations Rolling walker (2 wheels)  Recommendations for Other Services       Functional Status Assessment Patient has had a recent decline in their functional status and demonstrates  the ability to make significant improvements in function in a reasonable and predictable amount of time.     Precautions / Restrictions Precautions Precautions: Fall Restrictions Weight Bearing Restrictions: No      Mobility  Bed Mobility Overal bed mobility: Needs Assistance Bed Mobility: Supine to Sit     Supine to sit: Mod assist     General bed mobility comments: needs assist for B LE management and trunkal elevation. Once seated at EOB, able to maintain for several minutes prior to fatigue and request to return back supine. HR increases to 125bpm while seated and O2 sats decrease to 83% on 5L. Further mobility deferred at this time.    Transfers                   General transfer comment: pt declined at this date    Ambulation/Gait                  Stairs            Wheelchair Mobility    Modified Rankin (Stroke Patients Only)       Balance Overall balance assessment: Needs assistance, History of Falls Sitting-balance support: Feet unsupported, Bilateral upper extremity supported Sitting balance-Leahy Scale: Fair                                       Pertinent Vitals/Pain Pain Assessment Pain Assessment: No/denies pain    Home Living Family/patient expects to be discharged to:: Private residence Living Arrangements: Alone Available Help at Discharge: Family;Available PRN/intermittently Type of Home: House  Home Layout: One level Home Equipment: Rollator (4 wheels);Shower seat      Prior Function Prior Level of Function : Independent/Modified Independent;History of Falls (last six months)             Mobility Comments: reports she was indep with rollator for community distances. Reports 1 recent fall 4 weeks ago while ambulating outside ADLs Comments: indep     Hand Dominance        Extremity/Trunk Assessment   Upper Extremity Assessment Upper Extremity Assessment: Generalized weakness (B  UE grossly 3/5)    Lower Extremity Assessment Lower Extremity Assessment: Generalized weakness (B LE grossly 3+/5)       Communication   Communication: No difficulties  Cognition Arousal/Alertness: Awake/alert Behavior During Therapy: WFL for tasks assessed/performed Overall Cognitive Status: Impaired/Different from baseline                                 General Comments: delayed processing. Reports year as 2034        General Comments      Exercises Other Exercises Other Exercises: supine/seated ther-ex performed on B LE including AP, SAQ, SLRs, and hip abd/add. Also performed LAQ while seated at bedside. Cues for therapedic rest breaks   Assessment/Plan    PT Assessment Patient needs continued PT services  PT Problem List Decreased strength;Decreased activity tolerance;Decreased balance;Decreased mobility;Cardiopulmonary status limiting activity       PT Treatment Interventions DME instruction;Gait training;Therapeutic exercise;Balance training    PT Goals (Current goals can be found in the Care Plan section)  Acute Rehab PT Goals Patient Stated Goal: to go home PT Goal Formulation: With patient Time For Goal Achievement: 02/11/23 Potential to Achieve Goals: Good    Frequency Min 4X/week     Co-evaluation               AM-PAC PT "6 Clicks" Mobility  Outcome Measure Help needed turning from your back to your side while in a flat bed without using bedrails?: A Little Help needed moving from lying on your back to sitting on the side of a flat bed without using bedrails?: A Lot Help needed moving to and from a bed to a chair (including a wheelchair)?: A Lot Help needed standing up from a chair using your arms (e.g., wheelchair or bedside chair)?: A Lot Help needed to walk in hospital room?: Total Help needed climbing 3-5 steps with a railing? : Total 6 Click Score: 11    End of Session Equipment Utilized During Treatment: Oxygen Activity  Tolerance: Patient tolerated treatment well Patient left: in bed Nurse Communication: Mobility status PT Visit Diagnosis: Unsteadiness on feet (R26.81);Muscle weakness (generalized) (M62.81);History of falling (Z91.81);Difficulty in walking, not elsewhere classified (R26.2)    Time: 1610-9604 PT Time Calculation (min) (ACUTE ONLY): 19 min   Charges:   PT Evaluation $PT Eval Moderate Complexity: 1 Mod PT Treatments $Therapeutic Exercise: 8-22 mins        Elizabeth Palau, PT, DPT, GCS [phone]   Raizy Auzenne 01/28/2023, 9:14 AM

## 2023-01-28 NOTE — Evaluation (Signed)
Occupational Therapy Evaluation Patient Details Name: Crystal Mcmahon MRN: [id number] DOB: [birth year] Today's Date: 01/28/2023   History of Present Illness Pt admitted for sepsis with complaints of fever, hypoxia, and tachy. 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   Clinical Impression   Patient agreeable to OT evaluation. Pt presenting with decreased independence in self care, balance, functional mobility/transfers, and endurance. PTA pt lived alone, is normally independent for ADLs/IADLs, and Mod I for functional mobility using a rollator. Pt currently functioning at Mod A for bed mobility. Pt endorsed SOB with activity. HR in 120s and O2 sats decreasing to mid 80s on 5L. Pt provided extra time for a seated rest break once getting to EOB, however, requested to return to supine due to fatigue/SOB. Pt will benefit from skilled acute OT services to address deficits noted below. OT recommends ongoing therapy upon discharge to maximize safety and independence with ADLs, decrease fall risk, decrease caregiver burden, and promote return to PLOF.     Recommendations for follow up therapy are one component of a multi-disciplinary discharge planning process, led by the attending physician.  Recommendations may be updated based on patient status, additional functional criteria and insurance authorization.   Assistance Recommended at Discharge Frequent or constant Supervision/Assistance  Patient can return home with the following Two people to help with walking and/or transfers;A lot of help with bathing/dressing/bathroom;Assistance with cooking/housework;Assist for transportation;Help with stairs or ramp for entrance    Functional Status Assessment  Patient has had a recent decline in their functional status and demonstrates the ability to make significant improvements in function in a reasonable and predictable amount of time.  Equipment Recommendations   Other (comment) (defer to next venue of care)    Recommendations for Other Services       Precautions / Restrictions Precautions Precautions: Fall Restrictions Weight Bearing Restrictions: No      Mobility Bed Mobility Overal bed mobility: Needs Assistance Bed Mobility: Supine to Sit, Sit to Supine     Supine to sit: Mod assist Sit to supine: Mod assist   General bed mobility comments: Assistance required for trunk elevation and BLE management. VC for hand placement, +2 to scoot pt up toward [facility] due to fatigue     Transfers       General transfer comment: pt deferred      Balance Overall balance assessment: Needs assistance, History of Falls Sitting-balance support: Feet unsupported, Bilateral upper extremity supported Sitting balance-Leahy Scale: Fair         ADL either performed or assessed with clinical judgement   ADL Overall ADL's : Needs assistance/impaired     General ADL Comments: Pt functionally limited this date due to SOB, decreased endurance, and generalized weakness. Pt completing supine to sit and immediately wanting to take a rest break due to fatigue/SOB (attempting to lay back onto bed). Rest break provided and attempted to have pt scoot her hips forward at EOB, however, pt endorsed being unable to attempt at this time and self returned back to bed.     Vision Baseline Vision/History: 1 Wears glasses Patient Visual Report: No change from baseline       Perception     Praxis      Pertinent Vitals/Pain Pain Assessment Pain Assessment: No/denies pain     Hand Dominance     Extremity/Trunk Assessment Upper Extremity Assessment Upper Extremity Assessment: Generalized weakness   Lower Extremity Assessment Lower Extremity Assessment: Generalized weakness  Communication Communication Communication: No difficulties   Cognition Arousal/Alertness: Awake/alert Behavior During Therapy: WFL for tasks assessed/performed Overall  Cognitive Status: Impaired/Different from baseline     General Comments: increased time for processing, inaccurately reported the year, somewhat lethargic (due to fatigue/SOB?)     General Comments  HR 120s and O2 sats decreasesing to mid 80s with activity on 5L.    Exercises Other Exercises Other Exercises: OT provided education re: role of OT, OT POC, post acute recs, sitting up for all meals, EOB/OOB mobility with assistance, home/fall safety, energy conservation techniques (rest breaks, PLB)     Shoulder Instructions      Home Living Family/patient expects to be discharged to:: Private residence Living Arrangements: Alone Available Help at Discharge: Family;Available PRN/intermittently (cousin) Type of Home: House       Home Layout: One level     Bathroom Shower/Tub: Walk-in Human resources officer: Handicapped height     Home Equipment: Rollator (4 wheels);Shower seat;Transport chair;Grab bars - tub/shower;Other (comment) (2L O2)   Additional Comments: Son lives in [place].      Prior Functioning/Environment Prior Level of Function : History of Falls (last six months)             Mobility Comments: Normally Mod I using rollator for community distances, pt/son endorse recently using transport chair for home mobility. Reports 1 recent fall 4 weeks ago while ambulating outside ADLs Comments: Normally IND for ADLs. Pt endorsed her cousin has been assisting with "everything" recently (bathing, dressing, cooking, driving, household chores).        OT Problem List: Decreased strength;Decreased activity tolerance;Impaired balance (sitting and/or standing);Cardiopulmonary status limiting activity      OT Treatment/Interventions: Self-care/ADL training;Therapeutic exercise;Energy conservation;DME and/or AE instruction;Therapeutic activities;Patient/family education;Balance training    OT Goals(Current goals can be found in the care plan section) Acute Rehab OT  Goals Patient Stated Goal: improve breathing, go home OT Goal Formulation: With patient/family Time For Goal Achievement: 02/11/23 Potential to Achieve Goals: Good   OT Frequency: Min 3X/week    Co-evaluation              AM-PAC OT "6 Clicks" Daily Activity     Outcome Measure Help from another person eating meals?: None Help from another person taking care of personal grooming?: A Little Help from another person toileting, which includes using toliet, bedpan, or urinal?: A Lot Help from another person bathing (including washing, rinsing, drying)?: A Lot Help from another person to put on and taking off regular upper body clothing?: A Little Help from another person to put on and taking off regular lower body clothing?: A Lot 6 Click Score: 16   End of Session Equipment Utilized During Treatment: Oxygen Nurse Communication: Mobility status;Other (comment) (O2)  Activity Tolerance: Patient limited by fatigue;Other (comment) (SOB) Patient left: in bed;with call bell/phone within reach;with bed alarm set;with family/visitor present  OT Visit Diagnosis: Other abnormalities of gait and mobility (R26.89);Muscle weakness (generalized) (M62.81);History of falling (Z91.81)                Time: 1610-9604 OT Time Calculation (min): 18 min Charges:  OT General Charges $OT Visit: 1 Visit OT Evaluation $OT Eval Moderate Complexity: 1 Mod  [facility] MS, OTR/L ascom [phone]  01/28/23, 2:00 PM

## 2023-01-28 NOTE — Progress Notes (Signed)
Crystal Crystal Mcmahon NOTE    Crystal Crystal Mcmahon Crystal Mcmahon  ZOX:[id number] DOB: 30-Jul-1941 DOA: Crystal Mcmahon/03/2023 PCP: Crystal Crystal Mcmahon Crystal Mcmahon, Crystal Crystal Mcmahon  IC12A/IC12A-AA  LOS: Crystal Mcmahon days   Brief hospital course:   Assessment & Plan: 82 yo female with stage 4 lung cancer with mets to Crystal Mcmahon bone s/p neck radiation due to cervical mets (dx March Crystal Mcmahon, Crystal Crystal Mcmahon Crystal Mcmahon left neck.  She presented to [facility] ER on 05/7 with fevers, tachycardia, and hypoxia.  At baseline pt wears 2L O2 via nasal canula.  She has been followed by oncology and received her first dose of keytruda on 01/22/23.  She was recently taken off her antihypertensives due to hypotension and s/p dex taper.    Upon arrival to Crystal Mcmahon ER pts O2 sats were 84% on baseline O2@2L  via nasal canula requiring increase in O2 to 4L with improvement in oxygenation.  CXR concerning for pneumonia with trace pleural effusions and unchanged left apical mass.  Sepsis protocol initiated and pt received 1,750 ml iv fluid bolus, metronidazole, vancomycin, and cefepime.  Pt remained hypotensive requiring low dose levophed gtt.  PCCM team contacted for ICU admission.  Pt was off pressor and transferred to hospitalist service on Crystal Mcmahon/8/24.  #Septic and hypovolemic shock  --off pressor  #Mildly elevated troponin likely secondary to demand ischemia in Crystal Mcmahon setting of sepsis  Hypotension --BP low on presentation, now improved.  midodrine d/c'ed.   #Mild AKI secondary to ATN in Crystal Mcmahon setting of hypotension  - Trend BMP  - Replace electrolytes as indicated  - Monitor UOP  - Avoid nephrotoxic medications    #Sepsis secondary to pneumonia  --started on vanc and cefepime --d/c Vanc today due to neg MRSA screen --CXR Crystal Mcmahon/11 showed worsening opacities. Plan: --cont cefepime for 7-day course --start azithromycin to cover legionella, per pulm rec, even though legionella antigen neg on presentation, since Crystal Mcmahon antigen test is not sensitive. --steroid taper over 4 weeks per pulm (40 mg for 2 weeks, then 30 mg for Crystal Mcmahon days, then 20 mg  for Crystal Mcmahon days, then 10 mg for Crystal Mcmahon days)   # acute blood loss anemia #Iron deficiency anemia  --coffee ground emesis and large melanotic stool on Crystal Mcmahon/9 --Hgb dropped from 8.8 on presentation to 6.7, received 2u pRBC.  Hgb now stable around 9's. --monitor Hgb and transfuse to keep Hgb >7  # Upper GI bleed 2/2 duodenal ulcer # gastritis --coffee ground emesis and large melanotic stool on Crystal Mcmahon/9 --EGD Crystal Mcmahon/9 found large ulcer just past Crystal Mcmahon pylorus in Crystal Mcmahon duodenum. Another ulcer in Crystal Mcmahon pylorus. Crystal Mcmahon culprit lesion was Crystal Mcmahon duodenal ulcer which was treated.  --cont IV PPI BID --if bleeding continues, will need vascular embolization.    #Acute on chronic hypoxic respiratory failure  Chronic O2 @2L  O2 and Current Everyday Smoker --2/2 PNA in Crystal Mcmahon setting of lung cancer --increased O2 requirement on Crystal Mcmahon/11 to 7L.  Did get IVF in Crystal Mcmahon past days.  O2 down to 4L next day after diuresis.  Though should consider pembro induced lung toxicity at this point, per pulm. Plan: --cont IV lasix 40 mg daily --Continue supplemental O2 to keep sats between 88-92%, wean as tolerated  #Right pleural effusion 2/2 parapneumonic effusion --thoracentesis on Crystal Mcmahon/8, with 330 ml removed --fluid studies consistent with parapneumonic effusion   #Stage IV lung cancer with mets to Crystal Mcmahon bone --onc palliative consult  Hypokalemia --monitor and replete with IV potassim  Hypernatremia  --may be due to reduced oral hydration. --start 1/2 NS@50     DVT prophylaxis: Lovenox SQ Code Status: DNR  Family Communication: family and friend updated at bedside today Level of care: Progressive Dispo:   Crystal Mcmahon patient is from: home Anticipated d/c is to: home Anticipated d/c date is: undetermined   Subjective and Interval History:  Pt reported more urine output for Crystal Mcmahon past day.  Oral intake improved today.   Objective: Vitals:   01/28/23 1100 01/28/23 1200 01/28/23 1300 01/28/23 1400  BP: 128/64 122/61 (!) 114/57 131/66  Pulse: (!) 108 96  92 95  Resp: 17 16 18  (!) 21  Temp:  98.2 F (36.8 C)    TempSrc:      SpO2: 94% 95% 96% 95%  Weight:      Height:        Intake/Output Summary (Last 24 hours) at Crystal Mcmahon/08/2023 1628 Last data filed at Crystal Mcmahon/08/2023 1100 Gross per 24 hour  Intake 71.29 ml  Output 1400 ml  Net -1328.71 ml   Filed Weights   01/26/23 0500 01/27/23 0500 01/28/23 0412  Weight: 57.9 kg 46.8 kg 53.6 kg    Examination:   Constitutional: NAD, AAOx3 HEENT: conjunctivae and lids normal, EOMI CV: No cyanosis.   RESP: normal respiratory effort, on 4L Neuro: II - XII grossly intact.   Psych: Normal mood and affect.  Appropriate judgement and reason   Data Reviewed: I have personally reviewed labs and imaging studies  Time spent: 50 minutes   Darlin Priestly, Crystal Crystal Mcmahon [facility] If 7PM-7AM, please contact night-coverage Crystal Mcmahon/08/2023, 4:28 PM

## 2023-01-28 NOTE — Plan of Care (Signed)
  Problem: Education: Goal: Knowledge of General Education information will improve Description: Including pain rating scale, medication(s)/side effects and non-pharmacologic comfort measures Outcome: Not Progressing   Problem: Clinical Measurements: Goal: Respiratory complications will improve Outcome: Progressing Goal: Cardiovascular complication will be avoided Outcome: Progressing   Problem: Nutrition: Goal: Adequate nutrition will be maintained Outcome: Not Progressing

## 2023-01-28 NOTE — Consult Note (Signed)
PHARMACY CONSULT NOTE  Pharmacy Consult for Electrolyte Monitoring and Replacement   Recent Labs: Potassium (mmol/L)  Date Value  01/28/2023 3.6  12/30/2014 3.0 (L)   Magnesium (mg/dL)  Date Value  13/04/6577 2.0   Calcium (mg/dL)  Date Value  46/96/2952 7.2 (L)   Calcium, Total (mg/dL)  Date Value  84/13/2440 9.2   Albumin (g/dL)  Date Value  07/15/2535 1.8 (L)  12/30/2014 4.1   Phosphorus (mg/dL)  Date Value  64/40/3474 4.2   Sodium (mmol/L)  Date Value  01/28/2023 150 (H)  12/30/2014 136    Assessment: Patient with hx of stage 4 lung cancer with mets to the bone s/p neck radiation due to cervical mets (dx November 21, 2022) to the left neck admitted for septic and hypovolemic shock, Pharmacy consulted for electrolytes management.   MIVF 0.45 % sodium chloride at 50 mL/hr  Duiretics: furosemide 40 mg po daily  Goal of Therapy:  Electrolytes with normal limits  Plan:  no additional replacement ordered at this time. Follow-up BMP and Magnesium 5/13 with AM labs  Burnis Medin, PharmD, BCPS, 01/28/2023 7:34 AM

## 2023-01-29 ENCOUNTER — Encounter
Admission: EM | Disposition: A | Discharge status: Skilled Nursing Facility | Payer: Self-pay | Source: Home / Self Care | Attending: Internal Medicine

## 2023-01-29 ENCOUNTER — Ambulatory Visit: Payer: Medicare Other | Admitting: Radiation Oncology

## 2023-01-29 DIAGNOSIS — Z923 Personal history of irradiation: Secondary | ICD-10-CM

## 2023-01-29 DIAGNOSIS — I7789 Other specified disorders of arteries and arterioles: Secondary | ICD-10-CM | POA: Diagnosis not present

## 2023-01-29 DIAGNOSIS — F17210 Nicotine dependence, cigarettes, uncomplicated: Secondary | ICD-10-CM | POA: Diagnosis not present

## 2023-01-29 DIAGNOSIS — C3430 Malignant neoplasm of lower lobe, unspecified bronchus or lung: Secondary | ICD-10-CM | POA: Diagnosis not present

## 2023-01-29 DIAGNOSIS — C7951 Secondary malignant neoplasm of bone: Secondary | ICD-10-CM | POA: Diagnosis not present

## 2023-01-29 DIAGNOSIS — K260 Acute duodenal ulcer with hemorrhage: Secondary | ICD-10-CM

## 2023-01-29 DIAGNOSIS — K264 Chronic or unspecified duodenal ulcer with hemorrhage: Secondary | ICD-10-CM | POA: Diagnosis not present

## 2023-01-29 HISTORY — PX: EMBOLIZATION: CATH118239

## 2023-01-29 LAB — TYPE AND SCREEN
ABO/RH(D): B POS
Antibody Screen: NEGATIVE
Unit division: 0

## 2023-01-29 LAB — BPAM RBC: Blood Product Expiration Date: 202406172359

## 2023-01-29 LAB — BASIC METABOLIC PANEL
Anion gap: 8 (ref 5–15)
BUN: 36 mg/dL — ABNORMAL HIGH (ref 8–23)
CO2: 25 mmol/L (ref 22–32)
Calcium: 6.5 mg/dL — ABNORMAL LOW (ref 8.9–10.3)
Chloride: 115 mmol/L — ABNORMAL HIGH (ref 98–111)
Creatinine, Ser: 0.78 mg/dL (ref 0.44–1.00)
GFR, Estimated: >60 mL/min (ref >60)
Glucose, Bld: 94 mg/dL (ref 70–99)
Potassium: 3 mmol/L — ABNORMAL LOW (ref 3.5–5.1)
Sodium: 148 mmol/L — ABNORMAL HIGH (ref 135–145)

## 2023-01-29 LAB — CBC
HCT: 23.1 % — ABNORMAL LOW (ref 36.0–46.0)
Hemoglobin: 7.4 g/dL — ABNORMAL LOW (ref 12.0–15.0)
MCH: 30.1 pg (ref 26.0–34.0)
MCHC: 32 g/dL (ref 30.0–36.0)
MCV: 93.9 fL (ref 80.0–100.0)
Platelets: 78 10*3/uL — ABNORMAL LOW (ref 150–400)
RBC: 2.46 MIL/uL — ABNORMAL LOW (ref 3.87–5.11)
RDW: 16.5 % — ABNORMAL HIGH (ref 11.5–15.5)
WBC: 9.4 10*3/uL (ref 4.0–10.5)
nRBC: 2.2 % — ABNORMAL HIGH (ref 0.0–0.2)

## 2023-01-29 LAB — MAGNESIUM: Magnesium: 1.9 mg/dL (ref 1.7–2.4)

## 2023-01-29 LAB — PREPARE RBC (CROSSMATCH)

## 2023-01-29 LAB — GLUCOSE, CAPILLARY: Glucose-Capillary: 113 mg/dL — ABNORMAL HIGH (ref 70–99)

## 2023-01-29 LAB — HEMOGLOBIN AND HEMATOCRIT, BLOOD
HCT: 35.8 % — ABNORMAL LOW (ref 36.0–46.0)
Hemoglobin: 12.1 g/dL (ref 12.0–15.0)

## 2023-01-29 SURGERY — EMBOLIZATION
Anesthesia: Moderate Sedation

## 2023-01-29 MED ORDER — IODIXANOL 320 MG/ML IV SOLN
INTRAVENOUS | Status: DC | PRN
  Administered 2023-01-29: 45 mL

## 2023-01-29 MED ORDER — PROSOURCE PLUS PO LIQD
30.0000 mL | Freq: Three times a day (TID) | ORAL | Status: DC
  Filled 2023-01-29 (×3): qty 30

## 2023-01-29 MED ORDER — SODIUM CHLORIDE 0.45 % IV SOLN: INTRAVENOUS | Status: DC

## 2023-01-29 MED ORDER — FENTANYL CITRATE (PF) 100 MCG/2ML IJ SOLN
INTRAMUSCULAR | Status: DC | PRN
  Administered 2023-01-29: 25 ug via INTRAVENOUS

## 2023-01-29 MED ORDER — SODIUM CHLORIDE 0.9% IV SOLUTION: Freq: Once | INTRAVENOUS | Status: AC

## 2023-01-29 MED ORDER — MIDAZOLAM HCL 2 MG/ML PO SYRP: 8.0000 mg | ORAL_SOLUTION | Freq: Once | ORAL | Status: DC | PRN

## 2023-01-29 MED ORDER — SODIUM CHLORIDE 0.45 % IV SOLN
INTRAVENOUS | Status: DC
  Filled 2023-01-29 (×2): qty 1000

## 2023-01-29 MED ORDER — MIDAZOLAM HCL 2 MG/2ML IJ SOLN
INTRAMUSCULAR | Status: AC
  Filled 2023-01-29: qty 2

## 2023-01-29 MED ORDER — MAGNESIUM SULFATE 2 GM/50ML IV SOLN
2.0000 g | Freq: Once | INTRAVENOUS | Status: AC
  Administered 2023-01-29: 2 g via INTRAVENOUS
  Filled 2023-01-29 (×2): qty 50

## 2023-01-29 MED ORDER — CHLORHEXIDINE GLUCONATE CLOTH 2 % EX PADS: 6.0000 | MEDICATED_PAD | Freq: Once | CUTANEOUS | Status: DC

## 2023-01-29 MED ORDER — ONDANSETRON HCL 4 MG/2ML IJ SOLN: 4.0000 mg | Freq: Four times a day (QID) | INTRAMUSCULAR | Status: DC | PRN

## 2023-01-29 MED ORDER — HYDROMORPHONE HCL 1 MG/ML IJ SOLN: 1.0000 mg | Freq: Once | INTRAMUSCULAR | Status: DC | PRN

## 2023-01-29 MED ORDER — POTASSIUM CHLORIDE CRYS ER 20 MEQ PO TBCR
40.0000 meq | EXTENDED_RELEASE_TABLET | Freq: Two times a day (BID) | ORAL | Status: DC
  Administered 2023-01-29: 40 meq via ORAL
  Filled 2023-01-29: qty 2

## 2023-01-29 MED ORDER — FENTANYL CITRATE (PF) 100 MCG/2ML IJ SOLN: 12.5000 ug | Freq: Once | INTRAMUSCULAR | Status: DC | PRN

## 2023-01-29 MED ORDER — SODIUM CHLORIDE 0.9 % IV SOLN: INTRAVENOUS | Status: DC

## 2023-01-29 MED ORDER — MIDAZOLAM HCL 2 MG/2ML IJ SOLN
INTRAMUSCULAR | Status: DC | PRN
  Administered 2023-01-29: 1 mg via INTRAVENOUS

## 2023-01-29 MED ORDER — METHYLPREDNISOLONE SODIUM SUCC 125 MG IJ SOLR: 125.0000 mg | Freq: Once | INTRAMUSCULAR | Status: DC | PRN

## 2023-01-29 MED ORDER — DIPHENHYDRAMINE HCL 50 MG/ML IJ SOLN: 50.0000 mg | Freq: Once | INTRAMUSCULAR | Status: DC | PRN

## 2023-01-29 MED ORDER — FENTANYL CITRATE PF 50 MCG/ML IJ SOSY
PREFILLED_SYRINGE | INTRAMUSCULAR | Status: AC
  Filled 2023-01-29: qty 1

## 2023-01-29 MED ORDER — CEFAZOLIN SODIUM-DEXTROSE 2-4 GM/100ML-% IV SOLN
2.0000 g | INTRAVENOUS | Status: AC
  Administered 2023-01-29: 2 g via INTRAVENOUS

## 2023-01-29 MED ORDER — BOOST / RESOURCE BREEZE PO LIQD CUSTOM: 1.0000 | Freq: Three times a day (TID) | ORAL | Status: DC

## 2023-01-29 MED ORDER — FAMOTIDINE 20 MG PO TABS: 40.0000 mg | ORAL_TABLET | Freq: Once | ORAL | Status: DC | PRN

## 2023-01-29 SURGICAL SUPPLY — 19 items
CATH ANGIO 5F PIGTAIL 65CM (CATHETERS) IMPLANT
CATH MICROCATH PRGRT 2.8F 110 (CATHETERS) IMPLANT
CATH VS15FR (CATHETERS) IMPLANT
COIL 400 COMPLEX STD 3X20CM (Vascular Products) IMPLANT
COIL 400 COMPLEX STD 4X20CM (Vascular Products) IMPLANT
COIL 400 COMPLEX STD 4X30CM (Vascular Products) IMPLANT
COVER PROBE ULTRASOUND 5X96 (MISCELLANEOUS) IMPLANT
DEVICE OCCLUSION PODJ30 (Vascular Products) IMPLANT
DEVICE STARCLOSE SE CLOSURE (Vascular Products) IMPLANT
GLIDEWIRE STIFF .35X180X3 HYDR (WIRE) IMPLANT
GOWN STRL REUS W/TWL 2XL LVL3 (GOWN DISPOSABLE) IMPLANT
HANDLE DETACHMENT COIL (MISCELLANEOUS) IMPLANT
MICROCATH PROGREAT 2.8F 110 CM (CATHETERS) ×1
OCCLUSION DEVICE PODJ30 (Vascular Products) ×1 IMPLANT
PACK ANGIOGRAPHY (CUSTOM PROCEDURE TRAY) ×1 IMPLANT
SHEATH BRITE TIP 5FRX11 (SHEATH) IMPLANT
SYR MEDRAD MARK 7 150ML (SYRINGE) IMPLANT
TUBING CONTRAST HIGH PRESS 72 (TUBING) IMPLANT
WIRE GUIDERIGHT .035X150 (WIRE) IMPLANT

## 2023-01-29 NOTE — Op Note (Addendum)
[facility]  Percutaneous Study/Intervention Procedural Note     Surgeon(s): [employer]   Assistants: none  Pre-operative Diagnosis: 1. Bleeding duodenal ulcer  Post-operative diagnosis:  Same  Procedure(s) Performed:             1.  Ultrasound guidance for vascular access right femoral artery             2.  Catheter placement into gastroduodenal artery/pancreaticoduodenal artery             3.  Aortogram and selective angiogram of the celiac artery and gastroduodenal artery              4.  Coil embolization of the pancreaticoduodenal artery and gastroduodenal artery with a total of 5 Ruby coils             5.  StarClose closure device right femoral artery  Anesthesia: Moderate conscious sedation for approximately 23 minutes using 1 mg of Versed and 25 mcg of Fentanyl              EBL: 10 cc  Fluoro Time: 4.7 minutes  Contrast: 45 cc              Indications:  Patient is a 82 y.o.female with brisk upper GI bleeding from a duodenal ulcer.  She has had previous attempted endoscopic treatment but has had continued drop in her hemoglobin and we are asked to perform coil embolization. The patient is brought in for angiography for further evaluation and potential treatment. Risks and benefits are discussed and informed consent is obtained  Procedure:  The patient was identified and appropriate procedural time out was performed.  The patient was then placed supine on the table and prepped and draped in the usual sterile fashion. Moderate conscious sedation was administered during a face to face encounter with the patient throughout the procedure with my supervision of the RN administering medicines and monitoring the patient's vital signs, pulse oximetry, telemetry and mental status throughout from the start of the procedure until the patient was taken to the recovery room.  Ultrasound was used to evaluate the right common femoral artery.  It was patent .  A digital  ultrasound image was acquired.  A Seldinger needle was used to access the right common femoral artery under direct ultrasound guidance and a permanent image was performed.  A 0.035 J wire was advanced without resistance and a 5Fr sheath was placed.  Pigtail catheter was placed into the aorta and an AP aortogram was performed. This demonstrated a paucity of atherosclerosis with normal aorta and iliac arteries and normal renal arteries.  The double densities from the origins of the celiac and SMA could also be seen with typical flow through these vessels. A VS 1 catheter was used to selectively cannulate the celiac artery and selective imaging was performed.  This demonstrated atypical trifurcation of the celiac artery with a gastroduodenal artery coming off of the hepatic artery and its typical course.  There was definitely hyperemia and potentially a blush in the gastroduodenal artery that then continued onto the pancreaticoduodenal artery. Based on her continued bleeding I elected to treat this area with embolization. I initially advanced the Pro-Great microcatheter out the celiac artery and then down into the gastroduodenal artery and perform selective imaging.  There did appear to be a blush in this location from the duodenal ulcer.  I then used the prograde microcatheter and advanced around the bend of the gastroduodenal artery and  out into the pancreaticoduodenal artery some 8 to 10 cm beyond the curve.  Selective imaging was performed of the pancreaticoduodenal artery and then I began coil embolization at this point and then all the way back to the proximal portion of the gastroduodenal artery to avoid both typical inflow and backbleeding into the duodenal ulcer.  The first 2 coils were 3 mm diameter by 20 cm length standard coil starting in the pancreaticoduodenal artery and coming back to the distal gastroduodenal artery.  A 30 cm packing coil in the 4 millimeter diameter by 20 cm length and a 4 mm diameter  by 30 cm length standard coil was then deployed through the gastroduodenal artery and complete embolization was demonstrated on completion imaging.  With the prograde microcatheter removed, imaging through the V S1 catheter showed no flow in the gastroduodenal artery at this point.  I elected to terminate the procedure. The diagnostic catheter was removed. StarClose closure device was deployed in usual fashion with excellent hemostatic result. The patient was taken to the recovery room in stable condition having tolerated the procedure well.     Disposition: Patient was taken to the recovery room in stable condition having tolerated the procedure well.  Complications:  None  Festus Barren 01/29/2023 2:06 PM   This note was created with Dragon Medical transcription system. Any errors in dictation are purely unintentional.

## 2023-01-29 NOTE — H&P (View-Only) (Signed)
Hospital Consult    Reason for Consult:  GI Bleed Requesting Physician:  Dr Tina Lai MD MRN #:  [id number]  History of Present Illness: This is a 81 y.o. 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 She presented to [facility] ER on 05/7 with fevers, tachycardia, and hypoxia. At baseline pt wears 2L O2 via nasal canula. She has been followed by oncology and received her first dose of keytruda on 01/22/23. She was recently taken off her antihypertensives due to hypotension and s/p dex taper.   On 01/25/2023 patient underwent an upper endoscopy.  She was found to have a large ulcer just past the pylorus and the duodenum and another ulcer in the pylorus.  The actual bleeding ulcer was found in the duodenal which was treated with epinephrine anticoagulation gel according to the patient's sister.  Vascular surgery was consulted this morning for continued GI bleeding.  Patient was noted to have a bowel movement at midnight that was bloody and another bowel movement early this morning that was bloody as well.  Her hemoglobin continues to trend down to 7.4 this morning.  Past Medical History:  Diagnosis Date   Actinic keratosis    Basal cell carcinoma 06/14/2020   posterior neck, EDC   Breast cancer (HCC) 2008   left   Cancer (HCC)    melanoma   Complication of anesthesia    slow to wake up after surgery   GERD (gastroesophageal reflux disease)    Hypertension    Melanoma (HCC) 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 Malignant melanoma, lentigo maligna type. Clark's level II, Breslow's 0.3mm   Personal history of radiation therapy    Left breast   Personal history of tobacco use, presenting hazards to health 05/31/2015   Tobacco abuse 01/27/2015   Tobacco abuse 01/27/2015    Past Surgical History:  Procedure Laterality Date   BREAST EXCISIONAL BIOPSY Left 2008   + radation   BREAST EXCISIONAL BIOPSY Right 2001  2010   neg surgical bx's   BREAST LUMPECTOMY Left 2008   w/radiation   CATARACT EXTRACTION W/PHACO Right 04/12/2016   Procedure: Cataract extraction phaco and intraocular lens placement right eye;  Surgeon: Chadwick Brasington, MD;  Location: [facility];  Service: Ophthalmology;  Laterality: Right;   CATARACT EXTRACTION W/PHACO Left 01/24/2017   Procedure: CATARACT EXTRACTION PHACO AND INTRAOCULAR LENS PLACEMENT (IOC) Complicated left;  Surgeon: Brasington, Chadwick, MD;  Location: [facility];  Service: Ophthalmology;  Laterality: Left;  Complicated Malyugin   CHOLECYSTECTOMY     D&C and hysteroscopy  1996   LEEP  1996   TUBAL LIGATION      Allergies  Allergen Reactions   Acetaminophen-Pamabrom Rash and Swelling    Other reaction(s): Other (See Comments)  Other Reaction: RASH & SWELLING   Codeine Nausea And Vomiting   Midol Pm [Diphenhydramine-Apap (Sleep)] Swelling and Rash         Prior to Admission medications   Medication Sig Start Date End Date Taking? Authorizing Provider  acetaminophen (TYLENOL) 650 MG CR tablet Take 650 mg by mouth every 8 (eight) hours as needed for pain.   Yes [provider]  aspirin EC 325 MG tablet Take 325 mg by mouth daily.   Yes [provider]  atorvastatin (LIPITOR) 20 MG tablet Take 20 mg by mouth daily.   Yes [provider]  calcium carbonate (OSCAL)   1500 (600 CA) MG TABS tablet Take 600 mg of elemental calcium by mouth 2 (two) times daily with a meal.   Yes [provider]  citalopram (CELEXA) 10 MG tablet Take 10 mg by mouth daily.   Yes [provider]  gabapentin (NEURONTIN) 300 MG capsule Take 300 mg by mouth in the morning, at noon, and at bedtime. 11/27/22 01/23/23 Yes [provider]  levofloxacin (LEVAQUIN) 500 MG tablet Take 1 tablet (500 mg total) by mouth daily for 7 days. 01/23/23 01/30/23  Allen, Lauren G, NP  meloxicam (MOBIC) 15 MG tablet Take 1 tablet (15 mg  total) by mouth daily. 12/22/22  Yes Brahmanday, Govinda R, MD  morphine (MSIR) 15 MG tablet 1/2 -1 pill every 8- 12 hours as needed. Patient taking differently: Take 7.5-15 mg by mouth every 8 (eight) hours as needed for moderate pain. 1/2 -1 pill every 8- 12 hours as needed. 01/19/23  Yes Brahmanday, Govinda R, MD  ondansetron (ZOFRAN) 8 MG tablet One pill every 8 hours as needed for nausea/vomitting. Patient taking differently: Take 8 mg by mouth every 8 (eight) hours as needed for nausea or vomiting. One pill every 8 hours as needed for nausea/vomitting. 12/07/22  Yes Brahmanday, Govinda R, MD  sucralfate (CARAFATE) 1 g tablet Take 1 tablet (1 g total) by mouth 3 (three) times daily. Dissolve tablet in 3to 4 tables spoons warm water swish and swallow 12/22/22  Yes Chrystal, Glenn, MD  amLODipine (NORVASC) 2.5 MG tablet Take 2.5 mg by mouth daily.  Patient not taking: Reported on 01/19/2023 01/14/16   [provider]  lansoprazole (PREVACID) 30 MG capsule Take 1 capsule (30 mg total) by mouth daily at 12 noon. Patient not taking: Reported on 01/23/2023 12/22/22   Chrystal, Glenn, MD  triamterene-hydrochlorothiazide (DYAZIDE) 37.5-25 MG capsule Take 1 capsule by mouth daily.  Patient not taking: Reported on 01/19/2023 06/10/15   [provider]    Social History   Socioeconomic History   Marital status: Married    Spouse name: Not on file   Number of children: Not on file   Years of education: Not on file   Highest education level: Not on file  Occupational History   Not on file  Tobacco Use   Smoking status: Every Day    Packs/day: 0.25    Years: 32.00    Additional pack years: 0.00    Total pack years: 8.00    Types: Cigarettes   Smokeless tobacco: Never  Substance and Sexual Activity   Alcohol use: No    Alcohol/week: 0.0 standard drinks of alcohol    Comment: occassional   Drug use: No   Sexual activity: Not Currently  Other Topics Concern   Not on file  Social History  Narrative   Lives by self; husband in assisted living; Smoker since 1950s [college]; rare alcohol. 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 2 children- [place]/ and [place].    Social Determinants of Health   Financial Resource Strain: Low Risk  (01/12/2023)   Overall Financial Resource Strain (CARDIA)    Difficulty of Paying Living Expenses: Not hard at all  Food Insecurity: No Food Insecurity (01/23/2023)   Hunger Vital Sign    Worried About Running Out of Food in the Last Year: Never true    Ran Out of Food in the Last Year: Never true  Transportation Needs: No Transportation Needs (01/23/2023)   PRAPARE - Transportation    Lack of Transportation (Medical): No      Lack of Transportation (Non-Medical): No  Recent Concern: Transportation Needs - Unmet Transportation Needs (01/16/2023)   PRAPARE - Transportation    Lack of Transportation (Medical): Yes    Lack of Transportation (Non-Medical): Yes  Physical Activity: Inactive (01/12/2023)   Exercise Vital Sign    Days of Exercise per Week: 0 days    Minutes of Exercise per Session: 0 min  Stress: No Stress Concern Present (01/12/2023)   [employer] - Occupational Stress Questionnaire    Feeling of Stress : Only a little  Social Connections: Moderately Integrated (01/12/2023)   Social Connection and Isolation Panel [NHANES]    Frequency of Communication with Friends and Family: Twice a week    Frequency of Social Gatherings with Friends and Family: Once a week    Attends Religious Services: 1 to 4 times per year    Active Member of Clubs or Organizations: No    Attends Club or Organization Meetings: Never    Marital Status: Married  Recent Concern: Social Connections - Moderately Isolated (01/12/2023)   Social Connection and Isolation Panel [NHANES]    Frequency of Communication with Friends and Family: Once a week    Frequency of Social Gatherings with Friends and Family: Once a week    Attends Religious Services: 1  to 4 times per year    Active Member of Clubs or Organizations: No    Attends Club or Organization Meetings: Never    Marital Status: Married  Intimate Partner Violence: Not At Risk (01/23/2023)   Humiliation, Afraid, Rape, and Kick questionnaire    Fear of Current or Ex-Partner: No    Emotionally Abused: No    Physically Abused: No    Sexually Abused: No     Family History  Problem Relation Age of Onset   Pancreatic cancer Father    Breast cancer Neg Hx     ROS: Otherwise negative unless mentioned in HPI  Physical Examination  Vitals:   01/29/23 1016 01/29/23 1054  BP: (!) 132/58 (!) 120/57  Pulse:  (!) 103  Resp:  18  Temp:  98.3 F (36.8 C)  SpO2: 91%    Body mass index is 25.1 kg/m.  General:  WDWN in NAD Gait: Not observed HENT: WNL, normocephalic Pulmonary: normal non-labored breathing, without Rales, rhonchi,  wheezing Cardiac: regular, without  Murmurs, rubs or gallops; without carotid bruits Abdomen: Hyperactive Bowel sounds,  soft, NT/ND, no masses Skin: without rashes Vascular Exam/Pulses: Pwithoutalpable Pulses throughout Extremities: without ischemic changes, without Gangrene , without cellulitis; without open wounds;  Musculoskeletal: no muscle wasting or atrophy  Neurologic: A&O X 3;  No focal weakness or paresthesias are detected; speech is fluent/normal Psychiatric:  The pt has Normal affect. Lymph:  Unremarkable  CBC    Component Value Date/Time   WBC 9.4 01/29/2023 0429   RBC 2.46 (L) 01/29/2023 0429   HGB 7.4 (L) 01/29/2023 0429   HGB 9.7 (L) 01/19/2023 1502   HGB 14.2 12/30/2014 1033   HCT 23.1 (L) 01/29/2023 0429   HCT 42.6 12/30/2014 1033   PLT 78 (L) 01/29/2023 0429   PLT 293 01/19/2023 1502   PLT 266 12/30/2014 1033   MCV 93.9 01/29/2023 0429   MCV 92 12/30/2014 1033   MCH 30.1 01/29/2023 0429   MCHC 32.0 01/29/2023 0429   RDW 16.5 (H) 01/29/2023 0429   RDW 13.3 12/30/2014 1033   LYMPHSABS 0.7 01/25/2023 0552   LYMPHSABS 1.8  12/30/2014 1033   MONOABS 0.2 01/25/2023 0552     MONOABS 0.5 12/30/2014 1033   EOSABS 0.0 01/25/2023 0552   EOSABS 0.0 12/30/2014 1033   BASOSABS 0.0 01/25/2023 0552   BASOSABS 0.1 12/30/2014 1033    BMET    Component Value Date/Time   NA 148 (H) 01/29/2023 0429   NA 136 12/30/2014 1033   K 3.0 (L) 01/29/2023 0429   K 3.0 (L) 12/30/2014 1033   CL 115 (H) 01/29/2023 0429   CL 100 (L) 12/30/2014 1033   CO2 25 01/29/2023 0429   CO2 30 12/30/2014 1033   GLUCOSE 94 01/29/2023 0429   GLUCOSE 117 (H) 12/30/2014 1033   BUN 36 (H) 01/29/2023 0429   BUN 21 (H) 12/30/2014 1033   CREATININE 0.78 01/29/2023 0429   CREATININE 1.14 (H) 01/19/2023 1502   CREATININE 0.99 12/30/2014 1033   CALCIUM 6.5 (L) 01/29/2023 0429   CALCIUM 9.2 12/30/2014 1033   GFRNONAA >60 01/29/2023 0429   GFRNONAA 48 (L) 01/19/2023 1502   GFRNONAA 56 (L) 12/30/2014 1033   GFRAA >60 02/01/2017 0830   GFRAA >60 12/30/2014 1033    COAGS: Lab Results  Component Value Date   INR 1.1 01/23/2023   INR 0.9 11/29/2022     Non-Invasive Vascular Imaging:   NONE  Statin:  Yes.   Beta Blocker:  No. Aspirin:  Yes.   ACEI:  No. ARB:  No. CCB use:  Yes Other antiplatelets/anticoagulants:  No.    ASSESSMENT/PLAN: This is a 81 y.o. 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 She presented to [facility] ER on 05/7 with fevers, tachycardia, and hypoxia.  Upon workup patient was found to have a GI bleed.  She had an upper endoscopy on 01/25/2023 that revealed multiple duodenal ulcers.  Were treated at this time but the patient continues to bleed.  Patient was ordered by the hospitalist 2 units of packed red blood cells for hemoglobin of 7.4 this morning.  PLAN: Vascular surgery plans on taking the patient to the vascular lab this afternoon for an gastroduodenal embolization.  I discussed in detail with the patient and family at the bedside  the procedure, benefits, risks, and complications.  Patient verbalized her understanding.  I answered all there questions.  Patient has had nothing but clear liquids since midnight last night.  Patient's vitals are remained stable.   -I discussed the plan in detail with Dr. Jason Dew MD and he is in agreement with the plan   Devin Foskey R Jennette [facility] 01/29/2023 11:20 AM  

## 2023-01-29 NOTE — Progress Notes (Signed)
       CROSS COVER NOTE  NAME: Crystal Mcmahon MRN: [id number] DOB : April 03, 1941 ATTENDING PHYSICIAN: Darlin Priestly, MD    Date of Service   01/29/2023   HPI/Events of Note   Report/Request "Noticed that she had oliguria upon obtaining last set of vitals and performed bladder scan on her. She has 408 mls left in bladder according to bladder scan but only 10 mls is in external cath container. She is currently on 1/2 NS @ an hour scheduled to complete at 0514 this morning. Looks like she had great output beforehand. ICU nurse reported that a foley was just removed on day shift but I cannot find any documentation of removal. In and out cath her? " Rn reports M(r)s Weissenborn is too weak to stand and pivot to use bedside commode.   Interventions   Assessment/Plan: In and out Cath x1--> 800 mL output      To reach the provider On-Call:   7AM- 7PM see care teams to locate the attending and reach out to them via [email or web address] Password: [email or web address] 7PM-7AM contact night-coverage If you still have difficulty reaching the appropriate provider, please page the [facility] (Director on Call) for [facility] on amion for assistance  This document was prepared using Conservation officer, historic buildings and may include unintentional dictation errors.  Bishop Limbo DNP, MBA, FNP-BC, PMHNP-BC Nurse Practitioner [facility] Pager [phone]

## 2023-01-29 NOTE — Consult Note (Signed)
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   MIVF 0.45 % sodium chloride at 50 mL/hr - order expired.   Duiretics: furosemide 40 mg IV daily  Goal of Therapy:  Electrolytes with normal limits  Plan:  KCL 40 mEq PO BID and Mg 2 g IV x 1.  F/u with AM labs.   Paschal Dopp, PharmD, BCPS, 01/29/2023 7:54 AM

## 2023-01-29 NOTE — Progress Notes (Signed)
Nutrition Follow-up  DOCUMENTATION CODES:   Non-severe (moderate) malnutrition in context of chronic illness  INTERVENTION:   -D/c Ensure Enlive po BID, each supplement provides 350 kcal and 20 grams of protein.  -MVI with minerals daily -Boost Breeze po TID, each supplement provides 250 kcal and 9 grams of protein  -30 ml Prosource Plus TID, each supplement provides 100 kcals and 15 grams protein -RD will follow for diet advancement and adjust supplement regimen as appropriate  NUTRITION DIAGNOSIS:   Moderate Malnutrition related to chronic illness (stage IV lung cancer with mets to bone) as evidenced by mild fat depletion, moderate fat depletion, mild muscle depletion, moderate muscle depletion.  Ongoing  GOAL:   Patient will meet greater than or equal to 90% of their needs  Unmet  MONITOR:   PO intake, Supplement acceptance, Diet advancement  REASON FOR ASSESSMENT:   Consult Assessment of nutrition requirement/status  ASSESSMENT:   82 y/o female with h/o thyroid cancer s/p rt hemithyroidectomy 03/2018, stage IV lung cancer with bone mets s/p chemo/radiation, breast cancer s/p lumpectomy/radiation, stroke, HTN, GERD, IDA and anxiety who is admited with PNA, sepsis and AKI.  5/8- s/p thoracentesis (330 ml removed) 5/9- s/p EGD- revealed hiatal hernia, non-bleeding gastric ulcer, hematin in stomach, moderately severe radiation esophagitis with no bleeding, non-bleeding duodenal ulcers with adherent clot (injected, hemostatic spray applied); advanced to clear liquid diet   Reviewed I/O's: -514 ml x 24 hours and +3.2 L since admission  UOP: 1.8 L x 24 hours   Per MD notes, if bleeding continues to occur, will need embolization. Per nursing notes, pt with continues with blood pooling from her rectum.   Pt very weak, unable to stand on her own per nursing notes.   Pt remains on clear liquid diet. Pt with very poor oral intake. Noted meal completions 10-25%.   Wt has  been stable since admission.   Palliative consult for goals of care; pt would like to continue full scope care at this time.   Medications reviewed and include vitamin C, lasix, potassium chloride, prednisone, and magnesium sulfate.   Labs reviewed: Na: 148, K: 3.0 (on PO supplementation), CBGS: 138.   Diet Order:   Diet Order             Diet clear liquid Room service appropriate? Yes; Fluid consistency: Thin  Diet effective now                   EDUCATION NEEDS:   No education needs have been identified at this time  Skin:  Skin Assessment: Skin Integrity Issues: Skin Integrity Issues:: Stage II Stage II: lt arm, rt elbow  Last BM:  01/29/23 (type 7)  Height:   Ht Readings from Last 1 Encounters:  01/25/23 5' (1.524 m)    Weight:   Wt Readings from Last 1 Encounters:  01/29/23 58.3 kg    Ideal Body Weight:  45.5 kg  BMI:  Body mass index is 25.1 kg/m.  Estimated Nutritional Needs:   Kcal:  1550-1750  Protein:  75-90 grams  Fluid:  > 1.5 L    Levada Schilling, RD, LDN, CDCES Registered Dietitian II Certified Diabetes Care and Education Specialist Please refer to [facility] for RD and/or RD on-call/weekend/after hours pager

## 2023-01-29 NOTE — Progress Notes (Signed)
PT Cancellation Note  Patient Details Name: Crystal Mcmahon MRN: [id number] DOB: 1941/07/03   Cancelled Treatment:    Reason Eval/Treat Not Completed: Other (comment). Upon arrival, pt with low sats on 3L of O2 (83%). Increased to 3.5L with sats at 89%- RN notified. Pt mouth breathing and demonstrates inconsistent ability to perform PLB. Upon removal of covers, pt noted to have copious bleeding from rectum from mid calf up to waist. RN notified. Will hold off on PT session at this time.   Kimbrely Buckel 01/29/2023, 10:45 AM Elizabeth Palau, PT, DPT, GCS [phone]

## 2023-01-29 NOTE — Progress Notes (Signed)
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  She presented to [facility] ER on 05/7 with fevers, tachycardia, and hypoxia.  At baseline pt wears 2L O2 via nasal canula.  She has been followed by oncology and received her first dose of keytruda on 01/22/23.  She was recently taken off her antihypertensives due to hypotension and s/p dex taper.    Upon arrival to the ER pts O2 sats were 84% on baseline O2@2L  via nasal canula requiring increase in O2 to 4L with improvement in oxygenation.  CXR concerning for pneumonia with trace pleural effusions and unchanged left apical mass.  Sepsis protocol initiated and pt received 1,750 ml iv fluid bolus, metronidazole, vancomycin, and cefepime.  Pt remained hypotensive requiring low dose levophed gtt.  PCCM team contacted for ICU admission.  Pt was off pressor and transferred to hospitalist service on 01/24/23.  #Septic and hypovolemic shock  --off pressor  #Mildly elevated troponin likely secondary to demand ischemia in the setting of sepsis  Hypotension --BP low on presentation, now improved.  midodrine d/c'ed.   # AKI, ruled out --did not meet criteria.  #Sepsis secondary to pneumonia  --started on vanc and cefepime --d/c Vanc today due to neg MRSA screen --CXR 5/11 showed worsening opacities.  started azithromycin to cover legionella, per Dr. Aundria Rud rec, even though legionella antigen neg on presentation, since the antigen test is not sensitive. Plan: --cont cefepime for 7-day course --cont azithromycin --steroid taper over 4 weeks per Dr. Aundria Rud (40 mg for 2 weeks, then 30 mg for 5 days, then 20 mg for 5 days, then 10 mg for 5 days)   # acute blood loss anemia #Iron deficiency  anemia  --coffee ground emesis and large melanotic stool on 5/9 --Hgb dropped from 8.8 on presentation to 6.7, received 2u pRBC.   --2u pRBC today for Hgb drop in the setting of active GI bleed.  # Upper GI bleed 2/2 duodenal ulcer # gastritis --coffee ground emesis and large melanotic stool on 5/9 --EGD 5/9 found large ulcer just past the pylorus in the duodenum. Another ulcer in the pylorus. The culprit lesion was the duodenal ulcer which was treated.  --cont IV PPI BID --found re-bleeding this morning, vascular consulted for embolization.   #Acute on chronic hypoxic respiratory failure  Chronic O2 @2L  O2 and Current Everyday Smoker --2/2 PNA in the setting of lung cancer --increased O2 requirement on 5/11 to 7L.  Did get IVF in the past days.  O2 down to 4L next day after diuresis.  Though should consider pembro induced lung toxicity at this point, per pulm. Plan: --cont IV lasix 40 mg daily --Continue supplemental O2 to keep sats between 88-92%, wean as tolerated  #Right pleural effusion 2/2 parapneumonic effusion --thoracentesis on 5/8, with 330 ml removed --fluid studies consistent with parapneumonic effusion   #Stage IV lung cancer with mets to the bone --onc palliative consult  Hypokalemia --monitor and replete with IV potassim  Hypernatremia  --may be due to reduced oral hydration. --cont 1/2 NS@50  while diuresing    DVT prophylaxis: Lovenox SQ Code Status: DNR  Family Communication: family and friend updated at bedside today Level of care: Stepdown Dispo:  The patient is from: home Anticipated d/c is to: home Anticipated d/c date is: undetermined   Subjective and Interval History:  This morning, RN reported finding pooled blood from rectum in bed.  Pt was ordered 2u pRBC emergently and taken by vascular surgery for embolization.   Objective: Vitals:   01/29/23 1515 01/29/23 1615 01/29/23 1630 01/29/23 1645  BP:  134/84 121/84 125/73  Pulse:  100 95 91   Resp:  18 20 20   Temp: (!) 97.1 F (36.2 C)     TempSrc: Oral     SpO2:  100% 96% 97%  Weight:      Height:        Intake/Output Summary (Last 24 hours) at 01/29/2023 1712 Last data filed at 01/29/2023 1449 Gross per 24 hour  Intake 1209.42 ml  Output 1000 ml  Net 209.42 ml   Filed Weights   01/28/23 0412 01/29/23 0404 01/29/23 0435  Weight: 53.6 kg 58.4 kg 58.3 kg    Examination:   Constitutional: NAD, AAOx3 HEENT: conjunctivae and lids normal, EOMI CV: No cyanosis.   RESP: normal respiratory effort, on 3L   Data Reviewed: I have personally reviewed labs and imaging studies  Time spent: 50 minutes   Darlin Priestly, MD [facility] If 7PM-7AM, please contact night-coverage 01/29/2023, 5:12 PM

## 2023-01-29 NOTE — Progress Notes (Signed)
Patient does not talk much and has flat affect. Voices that she feels weak. Has blood that pools from her rectum but looking at previous charting from ICU, this is not a new finding. Patient has had multiple units of blood since admission and her H/H is being monitored. Noticed that patient is not urinating. Previous nurse stated that patient had a foley removed yesterday but I did not see documentation of removal. Bladder scanned and patient had over 408 mls in bladder. Contacted on call provider and received an order for in/out cath. 800 mls  of urine came out during in/out cath. Hemoglobin has decreased from 8.6-7.4

## 2023-01-29 NOTE — Consult Note (Signed)
Hospital Consult    Reason for Consult:  GI Bleed Requesting Physician:  Dr Darlin Priestly MD MRN #:  [id number]  History of Present Illness: This is a 82 y.o. 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 She presented to [facility] ER on 05/7 with fevers, tachycardia, and hypoxia. At baseline pt wears 2L O2 via nasal canula. She has been followed by oncology and received her first dose of keytruda on 01/22/23. She was recently taken off her antihypertensives due to hypotension and s/p dex taper.   On 01/25/2023 patient underwent an upper endoscopy.  She was found to have a large ulcer just past the pylorus and the duodenum and another ulcer in the pylorus.  The actual bleeding ulcer was found in the duodenal which was treated with epinephrine anticoagulation gel according to the patient's sister.  Vascular surgery was consulted this morning for continued GI bleeding.  Patient was noted to have a bowel movement at midnight that was bloody and another bowel movement early this morning that was bloody as well.  Her hemoglobin continues to trend down to 7.4 this morning.  Past Medical History:  Diagnosis Date   Actinic keratosis    Basal cell carcinoma 06/14/2020   posterior neck, EDC   Breast cancer (HCC) 2008   left   Cancer (HCC)    melanoma   Complication of anesthesia    slow to wake up after surgery   GERD (gastroesophageal reflux disease)    Hypertension    Melanoma (HCC) 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 Malignant melanoma, lentigo maligna type. Clark's level II, Breslow's 0.55mm   Personal history of radiation therapy    Left breast   Personal history of tobacco use, presenting hazards to health 05/31/2015   Tobacco abuse 01/27/2015   Tobacco abuse 01/27/2015    Past Surgical History:  Procedure Laterality Date   BREAST EXCISIONAL BIOPSY Left 2008   + radation   BREAST EXCISIONAL BIOPSY Right 2001  2010   neg surgical bx's   BREAST LUMPECTOMY Left 2008   w/radiation   CATARACT EXTRACTION W/PHACO Right 04/12/2016   Procedure: Cataract extraction phaco and intraocular lens placement right eye;  Surgeon: Lockie Mola, MD;  Location: [facility];  Service: Ophthalmology;  Laterality: Right;   CATARACT EXTRACTION W/PHACO Left 01/24/2017   Procedure: CATARACT EXTRACTION PHACO AND INTRAOCULAR LENS PLACEMENT (IOC) Complicated left;  Surgeon: Lockie Mola, MD;  Location: [facility];  Service: Ophthalmology;  Laterality: Left;  Complicated Malyugin   CHOLECYSTECTOMY     D&C and hysteroscopy  1996   LEEP  1996   TUBAL LIGATION      Allergies  Allergen Reactions   Acetaminophen-Pamabrom Rash and Swelling    Other reaction(s): Other (See Comments)  Other Reaction: RASH & SWELLING   Codeine Nausea And Vomiting   Midol Pm [Diphenhydramine-Apap (Sleep)] Swelling and Rash         Prior to Admission medications   Medication Sig Start Date End Date Taking? Authorizing Provider  acetaminophen (TYLENOL) 650 MG CR tablet Take 650 mg by mouth every 8 (eight) hours as needed for pain.   Yes [provider]  aspirin EC 325 MG tablet Take 325 mg by mouth daily.   Yes [provider]  atorvastatin (LIPITOR) 20 MG tablet Take 20 mg by mouth daily.   Yes [provider]  calcium carbonate (OSCAL)  1500 (600 CA) MG TABS tablet Take 600 mg of elemental calcium by mouth 2 (two) times daily with a meal.   Yes [provider]  citalopram (CELEXA) 10 MG tablet Take 10 mg by mouth daily.   Yes [provider]  gabapentin (NEURONTIN) 300 MG capsule Take 300 mg by mouth in the morning, at noon, and at bedtime. 11/27/22 01/23/23 Yes [provider]  levofloxacin (LEVAQUIN) 500 MG tablet Take 1 tablet (500 mg total) by mouth daily for 7 days. 01/23/23 01/30/23  Alinda Dooms, NP  meloxicam (MOBIC) 15 MG tablet Take 1 tablet (15 mg  total) by mouth daily. 12/22/22  Yes Earna Coder, MD  morphine (MSIR) 15 MG tablet 1/2 -1 pill every 8- 12 hours as needed. Patient taking differently: Take 7.5-15 mg by mouth every 8 (eight) hours as needed for moderate pain. 1/2 -1 pill every 8- 12 hours as needed. 01/19/23  Yes Earna Coder, MD  ondansetron (ZOFRAN) 8 MG tablet One pill every 8 hours as needed for nausea/vomitting. Patient taking differently: Take 8 mg by mouth every 8 (eight) hours as needed for nausea or vomiting. One pill every 8 hours as needed for nausea/vomitting. 12/07/22  Yes Brahmanday, Worthy Flank, MD  sucralfate (CARAFATE) 1 g tablet Take 1 tablet (1 g total) by mouth 3 (three) times daily. Dissolve tablet in 3to 4 tables spoons warm water swish and swallow 12/22/22  Yes Chrystal, Sherrine Maples, MD  amLODipine (NORVASC) 2.5 MG tablet Take 2.5 mg by mouth daily.  Patient not taking: Reported on 01/19/2023 01/14/16   [provider]  lansoprazole (PREVACID) 30 MG capsule Take 1 capsule (30 mg total) by mouth daily at 12 noon. Patient not taking: Reported on 01/23/2023 12/22/22   Carmina Miller, MD  triamterene-hydrochlorothiazide (DYAZIDE) 37.5-25 MG capsule Take 1 capsule by mouth daily.  Patient not taking: Reported on 01/19/2023 06/10/15   [provider]    Social History   Socioeconomic History   Marital status: Married    Spouse name: Not on file   Number of children: Not on file   Years of education: Not on file   Highest education level: Not on file  Occupational History   Not on file  Tobacco Use   Smoking status: Every Day    Packs/day: 0.25    Years: 32.00    Additional pack years: 0.00    Total pack years: 8.00    Types: Cigarettes   Smokeless tobacco: Never  Substance and Sexual Activity   Alcohol use: No    Alcohol/week: 0.0 standard drinks of alcohol    Comment: occassional   Drug use: No   Sexual activity: Not Currently  Other Topics Concern   Not on file  Social History  Narrative   Lives by self; husband in assisted living; Smoker since 102s [college]; rare alcohol. 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 2 children- [place]/ and [place].    Social Determinants of Health   Financial Resource Strain: Low Risk  (01/12/2023)   Overall Financial Resource Strain (CARDIA)    Difficulty of Paying Living Expenses: Not hard at all  Food Insecurity: No Food Insecurity (01/23/2023)   Hunger Vital Sign    Worried About Running Out of Food in the Last Year: Never true    Ran Out of Food in the Last Year: Never true  Transportation Needs: No Transportation Needs (01/23/2023)   PRAPARE - Administrator, Civil Service (Medical): No  Lack of Transportation (Non-Medical): No  Recent Concern: Transportation Needs - Unmet Transportation Needs (01/16/2023)   PRAPARE - Transportation    Lack of Transportation (Medical): Yes    Lack of Transportation (Non-Medical): Yes  Physical Activity: Inactive (01/12/2023)   Exercise Vital Sign    Days of Exercise per Week: 0 days    Minutes of Exercise per Session: 0 min  Stress: No Stress Concern Present (01/12/2023)   [employer] of Occupational Health - Occupational Stress Questionnaire    Feeling of Stress : Only a little  Social Connections: Moderately Integrated (01/12/2023)   Social Connection and Isolation Panel [NHANES]    Frequency of Communication with Friends and Family: Twice a week    Frequency of Social Gatherings with Friends and Family: Once a week    Attends Religious Services: 1 to 4 times per year    Active Member of [employer] or Organizations: No    Attends Banker Meetings: Never    Marital Status: Married  Recent Concern: Social Connections - Moderately Isolated (01/12/2023)   Social Connection and Isolation Panel [NHANES]    Frequency of Communication with Friends and Family: Once a week    Frequency of Social Gatherings with Friends and Family: Once a week    Attends Religious Services: 1  to 4 times per year    Active Member of [employer] or Organizations: No    Attends Banker Meetings: Never    Marital Status: Married  Catering manager Violence: Not At Risk (01/23/2023)   Humiliation, Afraid, Rape, and Kick questionnaire    Fear of Current or Ex-Partner: No    Emotionally Abused: No    Physically Abused: No    Sexually Abused: No     Family History  Problem Relation Age of Onset   Pancreatic cancer Father    Breast cancer Neg Hx     ROS: Otherwise negative unless mentioned in HPI  Physical Examination  Vitals:   01/29/23 1016 01/29/23 1054  BP: (!) 132/58 (!) 120/57  Pulse:  (!) 103  Resp:  18  Temp:  98.3 F (36.8 C)  SpO2: 91%    Body mass index is 25.1 kg/m.  General:  WDWN in NAD Gait: Not observed HENT: WNL, normocephalic Pulmonary: normal non-labored breathing, without Rales, rhonchi,  wheezing Cardiac: regular, without  Murmurs, rubs or gallops; without carotid bruits Abdomen: Hyperactive Bowel sounds,  soft, NT/ND, no masses Skin: without rashes Vascular Exam/Pulses: Pwithoutalpable Pulses throughout Extremities: without ischemic changes, without Gangrene , without cellulitis; without open wounds;  Musculoskeletal: no muscle wasting or atrophy  Neurologic: A&O X 3;  No focal weakness or paresthesias are detected; speech is fluent/normal Psychiatric:  The pt has Normal affect. Lymph:  Unremarkable  CBC    Component Value Date/Time   WBC 9.4 01/29/2023 0429   RBC 2.46 (L) 01/29/2023 0429   HGB 7.4 (L) 01/29/2023 0429   HGB 9.7 (L) 01/19/2023 1502   HGB 14.2 12/30/2014 1033   HCT 23.1 (L) 01/29/2023 0429   HCT 42.6 12/30/2014 1033   PLT 78 (L) 01/29/2023 0429   PLT 293 01/19/2023 1502   PLT 266 12/30/2014 1033   MCV 93.9 01/29/2023 0429   MCV 92 12/30/2014 1033   MCH 30.1 01/29/2023 0429   MCHC 32.0 01/29/2023 0429   RDW 16.5 (H) 01/29/2023 0429   RDW 13.3 12/30/2014 1033   LYMPHSABS 0.7 01/25/2023 0552   LYMPHSABS 1.8  12/30/2014 1033   MONOABS 0.2 01/25/2023 0552  MONOABS 0.5 12/30/2014 1033   EOSABS 0.0 01/25/2023 0552   EOSABS 0.0 12/30/2014 1033   BASOSABS 0.0 01/25/2023 0552   BASOSABS 0.1 12/30/2014 1033    BMET    Component Value Date/Time   NA 148 (H) 01/29/2023 0429   NA 136 12/30/2014 1033   K 3.0 (L) 01/29/2023 0429   K 3.0 (L) 12/30/2014 1033   CL 115 (H) 01/29/2023 0429   CL 100 (L) 12/30/2014 1033   CO2 25 01/29/2023 0429   CO2 30 12/30/2014 1033   GLUCOSE 94 01/29/2023 0429   GLUCOSE 117 (H) 12/30/2014 1033   BUN 36 (H) 01/29/2023 0429   BUN 21 (H) 12/30/2014 1033   CREATININE 0.78 01/29/2023 0429   CREATININE 1.14 (H) 01/19/2023 1502   CREATININE 0.99 12/30/2014 1033   CALCIUM 6.5 (L) 01/29/2023 0429   CALCIUM 9.2 12/30/2014 1033   GFRNONAA >60 01/29/2023 0429   GFRNONAA 48 (L) 01/19/2023 1502   GFRNONAA 56 (L) 12/30/2014 1033   GFRAA >60 02/01/2017 0830   GFRAA >60 12/30/2014 1033    COAGS: Lab Results  Component Value Date   INR 1.1 01/23/2023   INR 0.9 11/29/2022     Non-Invasive Vascular Imaging:   NONE  Statin:  Yes.   Beta Blocker:  No. Aspirin:  Yes.   ACEI:  No. ARB:  No. CCB use:  Yes Other antiplatelets/anticoagulants:  No.    ASSESSMENT/PLAN: This is a 82 y.o. 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 She presented to [facility] ER on 05/7 with fevers, tachycardia, and hypoxia.  Upon workup patient was found to have a GI bleed.  She had an upper endoscopy on 01/25/2023 that revealed multiple duodenal ulcers.  Were treated at this time but the patient continues to bleed.  Patient was ordered by the hospitalist 2 units of packed red blood cells for hemoglobin of 7.4 this morning.  PLAN: Vascular surgery plans on taking the patient to the vascular lab this afternoon for an gastroduodenal embolization.  I discussed in detail with the patient and family at the bedside  the procedure, benefits, risks, and complications.  Patient verbalized her understanding.  I answered all there questions.  Patient has had nothing but clear liquids since midnight last night.  Patient's vitals are remained stable.   -I discussed the plan in detail with Dr. Festus Barren MD and he is in agreement with the plan   Marcie Bal [facility] 01/29/2023 11:20 AM

## 2023-01-29 NOTE — Progress Notes (Signed)
Patient had blood that was pooled in her bed coming from her rectum.  MD notified.

## 2023-01-29 NOTE — Care Management Important Message (Signed)
Important Message  Patient Details  Name: Crystal Mcmahon MRN: [id number] Date of Birth: 11-Oct-1940   Medicare Important Message Given:  N/A - LOS <3 / Initial given by admissions     Olegario Messier A Engelbert Sevin 01/29/2023, 9:36 AM

## 2023-01-29 NOTE — Interval H&P Note (Signed)
History and Physical Interval Note:  01/29/2023 1:08 PM  Crystal Mcmahon  has presented today for surgery, with the diagnosis of GI bleed.  The various methods of treatment have been discussed with the patient and family. After consideration of risks, benefits and other options for treatment, the patient has consented to  Procedure(s): EMBOLIZATION (N/A) as a surgical intervention.  The patient's history has been reviewed, patient examined, no change in status, stable for surgery.  I have reviewed the patient's chart and labs.  Questions were answered to the patient's satisfaction.     Festus Barren

## 2023-01-30 ENCOUNTER — Encounter: Payer: Self-pay | Admitting: Vascular Surgery

## 2023-01-30 DIAGNOSIS — A419 Sepsis, unspecified organism: Secondary | ICD-10-CM | POA: Diagnosis not present

## 2023-01-30 LAB — BASIC METABOLIC PANEL
Anion gap: 7 (ref 5–15)
BUN: 39 mg/dL — ABNORMAL HIGH (ref 8–23)
CO2: 22 mmol/L (ref 22–32)
Calcium: 6.6 mg/dL — ABNORMAL LOW (ref 8.9–10.3)
Chloride: 121 mmol/L — ABNORMAL HIGH (ref 98–111)
Creatinine, Ser: 0.78 mg/dL (ref 0.44–1.00)
GFR, Estimated: >60 mL/min (ref >60)
Glucose, Bld: 113 mg/dL — ABNORMAL HIGH (ref 70–99)
Potassium: 3.3 mmol/L — ABNORMAL LOW (ref 3.5–5.1)
Sodium: 150 mmol/L — ABNORMAL HIGH (ref 135–145)

## 2023-01-30 LAB — BPAM RBC
Blood Product Expiration Date: 202406162359
ISSUE DATE / TIME: 202405131050
ISSUE DATE / TIME: 202405131050
Unit Type and Rh: 5100
Unit Type and Rh: 5100

## 2023-01-30 LAB — TYPE AND SCREEN: Unit division: 0

## 2023-01-30 LAB — CBC
HCT: 35.6 % — ABNORMAL LOW (ref 36.0–46.0)
Hemoglobin: 12 g/dL (ref 12.0–15.0)
MCH: 29.3 pg (ref 26.0–34.0)
MCHC: 33.7 g/dL (ref 30.0–36.0)
MCV: 86.8 fL (ref 80.0–100.0)
Platelets: 75 10*3/uL — ABNORMAL LOW (ref 150–400)
RBC: 4.1 MIL/uL (ref 3.87–5.11)
RDW: 18.1 % — ABNORMAL HIGH (ref 11.5–15.5)
WBC: 10.3 10*3/uL (ref 4.0–10.5)
nRBC: 1.6 % — ABNORMAL HIGH (ref 0.0–0.2)

## 2023-01-30 LAB — MAGNESIUM: Magnesium: 2.7 mg/dL — ABNORMAL HIGH (ref 1.7–2.4)

## 2023-01-30 MED ORDER — ENSURE ENLIVE PO LIQD
237.0000 mL | Freq: Three times a day (TID) | ORAL | Status: DC
  Administered 2023-01-30 – 2023-02-04 (×14): 237 mL via ORAL
  Administered 2023-02-06: 20 mL via ORAL
  Administered 2023-02-07: 237 mL via ORAL

## 2023-01-30 MED ORDER — ALBUMIN HUMAN 25 % IV SOLN
12.5000 g | Freq: Every day | INTRAVENOUS | Status: AC
  Administered 2023-01-30 – 2023-02-01 (×3): 12.5 g via INTRAVENOUS
  Filled 2023-01-30 (×3): qty 50

## 2023-01-30 MED ORDER — NYSTATIN 100000 UNIT/ML MT SUSP
5.0000 mL | Freq: Four times a day (QID) | OROMUCOSAL | Status: DC
  Administered 2023-01-30 – 2023-02-07 (×28): 500000 [IU] via ORAL
  Filled 2023-01-30 (×30): qty 5

## 2023-01-30 MED ORDER — POTASSIUM CHLORIDE 2 MEQ/ML IV SOLN
INTRAVENOUS | Status: AC
  Filled 2023-01-30 (×2): qty 1000

## 2023-01-30 NOTE — TOC Initial Note (Signed)
Transition of Care [facility]) - Initial/Assessment Note    Patient Details  Name: Crystal Mcmahon MRN: [id number] Date of Birth: 1940-11-03  Transition of Care [facility] CM/SW Contact:    Darolyn Rua, LCSW Phone Number: 01/30/2023, 10:00 AM  Clinical Narrative:                  CSW met with patient and son Tommy at bedside. He reports patient is off to a slow start this morning having problems swallowing her pills etc. Reports she is currently facetiming her daughter in [place], patient observed to have limited interactions.   Tommy informed of PT/OT recommendations for SNF level of care at discharge. He inquired as to home health services, reports he would like to see how patient does during her hospital stay and depending on medical course, as he is hopeful she would be able to discharge home with home health.   Patient currently remains in ICU, TOC will follow for discharge planning needs and appropriate dispo. Family hopeful of improvements in mobility to be able to take patient home with home health services.     Barriers to Discharge: Continued Medical Work up   Patient Goals and CMS Choice Patient states their goals for this hospitalization and ongoing recovery are:: to go home CMS Medicare.gov Compare Post Acute Care list provided to:: Patient Represenative (must comment) (son Orvilla Fus) Choice offered to / list presented to : Adult Children      Expected Discharge Plan and Services       Living arrangements for the past 2 months: Single Family Home                                      Prior Living Arrangements/Services Living arrangements for the past 2 months: Single Family Home Lives with:: Self                   Activities of Daily Living Home Assistive Devices/Equipment: Dentures (specify type), Eyeglasses, Grab bars around toilet, Grab bars in shower, Oxygen, Raised toilet seat with rails, Shower chair with back, Environmental consultant (specify type), Wheelchair ADL  Screening (condition at time of admission) Patient's cognitive ability adequate to safely complete daily activities?: Yes Is the patient deaf or have difficulty hearing?: No Does the patient have difficulty seeing, even when wearing glasses/contacts?: No Does the patient have difficulty concentrating, remembering, or making decisions?: No Patient able to express need for assistance with ADLs?: Yes Does the patient have difficulty dressing or bathing?: Yes Independently performs ADLs?: No Communication: Appropriate for developmental age Dressing (OT): Needs assistance Is this a change from baseline?: Pre-admission baseline Grooming: Needs assistance Is this a change from baseline?: Pre-admission baseline Feeding: Independent Bathing: Needs assistance Is this a change from baseline?: Pre-admission baseline Toileting: Needs assistance Is this a change from baseline?: Pre-admission baseline In/Out Bed: Needs assistance Is this a change from baseline?: Pre-admission baseline Walks in Home: Needs assistance Is this a change from baseline?: Pre-admission baseline Does the patient have difficulty walking or climbing stairs?: Yes Weakness of Legs: Both Weakness of Arms/Hands: Left  Permission Sought/Granted                  Emotional Assessment Appearance:: Appears stated age            Admission diagnosis:  Sepsis (HCC) [A41.9] Pneumonia of right upper lobe due to infectious organism [J18.9] Sepsis with acute hypoxic  respiratory failure and septic shock, due to unspecified organism (HCC) [A41.9, R65.21, J96.01] Patient Active Problem List   Diagnosis Date Noted   Palliative care encounter 01/25/2023   Pressure injury of skin 01/25/2023   Malnutrition of moderate degree 01/25/2023   Sepsis (HCC) 01/23/2023   Pneumonia of right upper lobe due to infectious organism 01/23/2023   Primary cancer of left upper lobe of lung (HCC) 12/22/2022   Cancer, metastatic to bone (HCC)  11/28/2022   Arthritis 07/06/2015   Malignant neoplasm of breast (HCC) 07/06/2015   BP (high blood pressure) 07/06/2015   Headache, migraine 07/06/2015   Personal history of tobacco use, presenting hazards to health 05/31/2015   Tobacco abuse 01/27/2015   PCP:  [name], MD Pharmacy:   [facility] [phone] - [place], [place] - [address] AT [facility] OF [address] [place] [address] [place] [address] Phone: [phone] Fax: [phone]     Social Determinants of Health (SDOH) Social History: SDOH Screenings   Food Insecurity: No Food Insecurity (01/23/2023)  Housing: Low Risk  (01/23/2023)  Transportation Needs: No Transportation Needs (01/23/2023)  Recent Concern: Transportation Needs - Unmet Transportation Needs (01/16/2023)  Utilities: Not At Risk (01/23/2023)  Alcohol Screen: Low Risk  (01/12/2023)  Depression (PHQ2-9): Low Risk  (11/29/2022)  Financial Resource Strain: Low Risk  (01/12/2023)  Physical Activity: Inactive (01/12/2023)  Social Connections: Moderately Integrated (01/12/2023)  Recent Concern: Social Connections - Moderately Isolated (01/12/2023)  Stress: No Stress Concern Present (01/12/2023)  Tobacco Use: High Risk (01/30/2023)   SDOH Interventions:     Readmission Risk Interventions     No data to display

## 2023-01-30 NOTE — Progress Notes (Signed)
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  She presented to [facility] ER on 05/7 with fevers, tachycardia, and hypoxia.  At baseline pt wears 2L O2 via nasal canula.  She has been followed by oncology and received her first dose of keytruda on 01/22/23.  She was recently taken off her antihypertensives due to hypotension and s/p dex taper.    Upon arrival to the ER pts O2 sats were 84% on baseline O2@2L  via nasal canula requiring increase in O2 to 4L with improvement in oxygenation.  CXR concerning for pneumonia with trace pleural effusions and unchanged left apical mass.  Sepsis protocol initiated and pt received 1,750 ml iv fluid bolus, metronidazole, vancomycin, and cefepime.  Pt remained hypotensive requiring low dose levophed gtt.  PCCM team contacted for ICU admission.  Pt was off pressor and transferred to hospitalist service on 01/24/23.  #Septic and hypovolemic shock  --off pressor  #Mildly elevated troponin likely secondary to demand ischemia in the setting of sepsis  Hypotension --BP low on presentation, now improved.  midodrine d/c'ed.   # AKI, ruled out --did not meet criteria.  #Sepsis secondary to pneumonia  --started on vanc and cefepime --d/c Vanc due to neg MRSA screen --CXR 5/11 showed worsening opacities.  started azithromycin to cover legionella, per Dr. Aundria Rud rec, even though legionella antigen neg on presentation, since the antigen test is not sensitive. --completed 7 days of cefepime and 3 days of azithromycin --steroid taper over 4 weeks per Dr. Aundria Rud (40 mg for 2 weeks, then 30 mg for 5 days, then 20 mg for 5 days, then 10 mg for 5 days)   # acute blood loss anemia #Iron deficiency anemia   --coffee ground emesis and large melanotic stool on 5/9 --received 4u pRBC total so far  # Upper GI bleed 2/2 duodenal ulcer # gastritis --coffee ground emesis and large melanotic stool on 5/9 --EGD 5/9 found large ulcer just past the pylorus in the duodenum. Another ulcer in the pylorus. The culprit lesion was the duodenal ulcer which was treated.  --found re-bleeding morning of 5/13, vascular performed coil embolization of the pancreaticcoduodenal artery and gastroduodenal artery. --cont IV PPI BID for now   #Acute on chronic hypoxic respiratory failure  Chronic O2 @2L  O2 and Current Everyday Smoker --2/2 PNA in the setting of lung cancer --increased O2 requirement on 5/11 to 7L.  Did get IVF in the past days.  O2 down to 4L next day after diuresis.  Though should consider pembro induced lung toxicity at this point, per pulm. Plan: --cont IV lasix 40 mg daily --Add IV albumin 12.5 g daily while on IV lasix --Continue supplemental O2 to keep sats between 88-92%, wean as tolerated  #Right pleural effusion 2/2 parapneumonic effusion --thoracentesis on 5/8, with 330 ml removed --fluid studies consistent with parapneumonic effusion   #Stage IV lung cancer with mets to the bone --onc palliative consult  Hypokalemia --monitor and replete with IV potassim  Hypernatremia  --may be due to reduced oral hydration. --cont D5 with K+@50  with diuretics  Non-severe (moderate) malnutrition in context of chronic illness  --supplements per dietician     DVT prophylaxis: Lovenox SQ Code Status:  DNR  Family Communication: family and friend updated at bedside today Level of care: Stepdown Dispo:   The patient is from: home Anticipated d/c is to: SNF rehab recommended, son undecided  Anticipated d/c date is: undetermined.  Need hypernatremia to improve, and Hgb stable.   Subjective and Interval History:  Continue to have liquid melena, however, Hgb 12 after just 2u of  pRBC.   Objective: Vitals:   01/30/23 1200 01/30/23 1300 01/30/23 1400 01/30/23 1800  BP: (!) 122/53 129/66 (!) 112/58 123/62  Pulse: 75 92 86 85  Resp: (!) 26 20 (!) 23 15  Temp:      TempSrc:      SpO2: 96% 96% 95% 93%  Weight:      Height:        Intake/Output Summary (Last 24 hours) at 01/30/2023 1901 Last data filed at 01/30/2023 1800 Gross per 24 hour  Intake 1539.69 ml  Output 900 ml  Net 639.69 ml   Filed Weights   01/29/23 0404 01/29/23 0435 01/30/23 0500  Weight: 58.4 kg 58.3 kg 56.3 kg    Examination:   Constitutional: NAD, AAOx3 HEENT: conjunctivae and lids normal, EOMI CV: No cyanosis.   RESP: normal respiratory effort SKIN: warm, dry.  Numerus small brown round marks over both legs (chronic) Neuro: II - XII grossly intact.     Data Reviewed: I have personally reviewed labs and imaging studies  Time spent: 35 minutes   Darlin Priestly, MD [facility] If 7PM-7AM, please contact night-coverage 01/30/2023, 7:01 PM

## 2023-01-30 NOTE — Consult Note (Addendum)
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   MIVF 1/2 NS + 40 mEq K/L at 50 cc/hr (48 mEq K+ / day) Duiretics: Furosemide 40 mg IV daily  Goal of Therapy:  Electrolytes with normal limits  Plan:  --Na 150, trending up slightly. Defer management to primary team --K 3.3, continue MIVF as above --Patient care transitioned from PCCM to [facility]. Will discontinue electrolyte consult at this time. Defer further ordering of labs and electrolyte management to primary team --Pharmacy will continue to follow along peripherally  Tressie Ellis 01/30/2023 7:55 AM

## 2023-01-30 NOTE — Progress Notes (Signed)
Progress Note    01/30/2023 1:40 PM 1 Day Post-Op  Subjective:  This is a 82 y.o. 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 She presented to [facility] ER on 05/7 with fevers, tachycardia, and hypoxia. On 01/25/2023 patient underwent an upper endoscopy. She was found to have a large ulcer just past the pylorus and the duodenum and another ulcer in the pylorus. The actual bleeding ulcer was found in the duodenal which was treated with epinephrine anticoagulation gel according to the patient's sister.   Patient is now POD #1 from Aortogram and selective angiogram of the celiac artery and gastroduodenal artery with coil embolization of the pancreaticcoduodenal artery and gastroduodenal artery. On exam patient rests comfortably in ICU. Right groin is with dressing clean dry and intact. No hematoma or seroma to note. No complaints overnight and vitals all remain stable.    Vitals:   01/30/23 1100 01/30/23 1200  BP: (!) 103/58 (!) 122/53  Pulse:  75  Resp:  (!) 26  Temp:    SpO2:  96%   Physical Exam: Cardiac:  RRR Normal S1, S2  Lungs:  normal non-labored breathing, without Rales, rhonchi, wheezing  Incisions:  Right groin with dressing clean dry and intact. No hematoma or seroma to note.  Extremities:  Bilateral lower extremities with palpable PT pulses and warm to touch.  Abdomen:  Positive bowel sounds throughout, soft, flat, non tender and non distended.  Neurologic: AAOX3, follows commands.   CBC    Component Value Date/Time   WBC 10.3 01/30/2023 0442   RBC 4.10 01/30/2023 0442   HGB 12.0 01/30/2023 0442   HGB 9.7 (L) 01/19/2023 1502   HGB 14.2 12/30/2014 1033   HCT 35.6 (L) 01/30/2023 0442   HCT 42.6 12/30/2014 1033   PLT 75 (L) 01/30/2023 0442   PLT 293 01/19/2023 1502   PLT 266 12/30/2014 1033   MCV 86.8 01/30/2023 0442   MCV 92 12/30/2014 1033   MCH 29.3 01/30/2023 0442   MCHC 33.7  01/30/2023 0442   RDW 18.1 (H) 01/30/2023 0442   RDW 13.3 12/30/2014 1033   LYMPHSABS 0.7 01/25/2023 0552   LYMPHSABS 1.8 12/30/2014 1033   MONOABS 0.2 01/25/2023 0552   MONOABS 0.5 12/30/2014 1033   EOSABS 0.0 01/25/2023 0552   EOSABS 0.0 12/30/2014 1033   BASOSABS 0.0 01/25/2023 0552   BASOSABS 0.1 12/30/2014 1033    BMET    Component Value Date/Time   NA 150 (H) 01/30/2023 0442   NA 136 12/30/2014 1033   K 3.3 (L) 01/30/2023 0442   K 3.0 (L) 12/30/2014 1033   CL 121 (H) 01/30/2023 0442   CL 100 (L) 12/30/2014 1033   CO2 22 01/30/2023 0442   CO2 30 12/30/2014 1033   GLUCOSE 113 (H) 01/30/2023 0442   GLUCOSE 117 (H) 12/30/2014 1033   BUN 39 (H) 01/30/2023 0442   BUN 21 (H) 12/30/2014 1033   CREATININE 0.78 01/30/2023 0442   CREATININE 1.14 (H) 01/19/2023 1502   CREATININE 0.99 12/30/2014 1033   CALCIUM 6.6 (L) 01/30/2023 0442   CALCIUM 9.2 12/30/2014 1033   GFRNONAA >60 01/30/2023 0442   GFRNONAA 48 (L) 01/19/2023 1502   GFRNONAA 56 (L) 12/30/2014 1033   GFRAA >60 02/01/2017 0830   GFRAA >60 12/30/2014 1033    INR    Component Value Date/Time   INR 1.1 01/23/2023 1212  Intake/Output Summary (Last 24 hours) at 01/30/2023 1340 Last data filed at 01/30/2023 0500 Gross per 24 hour  Intake 1355.65 ml  Output 900 ml  Net 455.65 ml     Assessment/Plan:  82 y.o. female is s/p Aortogram and selective angiogram of the celiac artery and gastroduodenal artery with coil embolization 1 Day Post-Op Patient's hemoglobin has stabilized at 12.0 today. No noted bloody bowel movements today.   PLAN: Continue to monitor Hemoglobin and Hematocrit Advance diet as tolerated Recommend to start with soft and bland diet.  PT and OT Evaluation and treat   DVT prophylaxis:  None due to GI Bleed   Peggye Ley Deshea [facility]oley [facility] 01/30/2023 1:40 PM

## 2023-01-30 NOTE — Progress Notes (Signed)
Nutrition Follow-up  DOCUMENTATION CODES:   Non-severe (moderate) malnutrition in context of chronic illness  INTERVENTION:   Ensure Enlive po TID, each supplement provides 350 kcal and 20 grams of protein.  Magic cup TID with meals, each supplement provides 290 kcal and 9 grams of protein  MVI po daily   Vitamin C 500mg  po BID   Pt at high refeed risk; recommend monitor potassium, magnesium and phosphorus labs daily until stable  Daily weights   NUTRITION DIAGNOSIS:   Moderate Malnutrition related to chronic illness (stage IV lung cancer with mets to bone) as evidenced by mild fat depletion, moderate fat depletion, mild muscle depletion, moderate muscle depletion. -ongoing   GOAL:   Patient will meet greater than or equal to 90% of their needs -not met   MONITOR:   PO intake, Supplement acceptance, Diet advancement, Labs, Weight trends, I & O's, Skin  ASSESSMENT:   82 y/o female with h/o thyroid cancer s/p rt hemithyroidectomy 03/2018, stage IV lung cancer with bone mets s/p chemo/radiation, breast cancer s/p lumpectomy/radiation, stroke, HTN, GERD, IDA and anxiety who is admited with PNA, sepsis and AKI.  -Pt s/p EGD 5/9; pt found to have hiatal hernia, radiation esophagitis, gastric and duodenal ulcers  -Pt s/p coil embolization 5/13  Pt with recurrent bleeding yesterday requiring vascular intervention. Pt has continued to have poor appetite and oral intake throughout her admission; pt eating <25% of meals. Pt has remained on NPO/clear diet since 5/9 and is now without adequate nutrition for > 6 days. Pt advanced to a full liquid diet today. Pt does not like Boost Breeze and is requesting Ensure. Per RN report, pt vomited this morning. Spoke with MD regarding the recommendation for TPN if recurrent bleeding is noted requiring NPO. Pt remains at high refeed risk. Per chart, pt appears weight stable since admission. Palliative care is following.   Medications reviewed and  include: vitamin C, celexa, lasix, MVI, protonix, prednisone, azithromycin, cefepime, 5% dextrose w/ KCl @50ml /hr  Labs reviewed: Na 150(H), K 3.3(L), BUN 39(H), Mg 2.7(H) Hgb 12.0 wnl, Hct 35.6(L)  Diet Order:   Diet Order             Diet full liquid Room service appropriate? Yes; Fluid consistency: Thin  Diet effective now                  EDUCATION NEEDS:   No education needs have been identified at this time  Skin:  Skin Assessment: Skin Integrity Issues: Skin Integrity Issues:: Stage II Stage II: lt arm, rt elbow  Last BM:  5/14- type 7  Height:   Ht Readings from Last 1 Encounters:  01/25/23 5' (1.524 m)    Weight:   Wt Readings from Last 1 Encounters:  01/30/23 56.3 kg    Ideal Body Weight:  45.5 kg  BMI:  Body mass index is 24.24 kg/m.  Estimated Nutritional Needs:   Kcal:  1500-1700kcal/day  Protein:  75-85g/day  Fluid:  1.4-1.6L/day  Betsey Holiday MS, RD, LDN Please refer to [facility] for RD and/or RD on-call/weekend/after hours pager

## 2023-01-30 NOTE — Evaluation (Signed)
Physical Therapy Evaluation Patient Details Name: Crystal Mcmahon MRN: [id number] DOB: 17-Nov-1940 Today's Date: 01/30/2023  History of Present Illness  Pt admitted for sepsis due to PNA with complaints of fever, hypoxia, and tachy. 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  Clinical Impression  Patient sleeping upon arrival to room; son at bedside.  Opens eyes and attends to therapist with verbal cuing/light touch, but demonstrates difficulty maintaining level of alertness.  Oriented to self, able to state name; limited ability to participate with additional orientation questions at this time.  Demonstrates clinical indicators of pain to L UE with movement attempts (FACES 4/10); improved with repositioning.  Globally weak and deconditioned throughout all extremities, with strength grossly 2+ to 3-/5 and ROM WFL (act assist/passive ROM).  Currently total assist/dependant for all repositioning efforts; anticipate need for +2 with progressive mobility efforts. Despite movement attempts, requires constant cuing for alertness throughout session; opens eyes, but unable to maintain for functional participation with progressive mobility. Unchanged with efforts at therex or repositioning in bed.  Additional mobility efforts deferred at this time as result.  Will continue to assess/progress in subsequent sessions as medically appropriate. Would benefit from skilled PT to address above deficits and promote optimal return to PLOF; recommend moderate-intensity post-acute rehab services at discharge.     Recommendations for follow up therapy are one component of a multi-disciplinary discharge planning process, led by the attending physician.  Recommendations may be updated based on patient status, additional  functional criteria and insurance authorization.  Follow Up Recommendations Can patient physically be transported by private vehicle: No     Assistance Recommended at Discharge Frequent or constant Supervision/Assistance  Patient can return home with the following  Two people to help with walking and/or transfers;A lot of help with bathing/dressing/bathroom;Help with stairs or ramp for entrance;Assistance with cooking/housework;Assistance with feeding;Assist for transportation;Direct supervision/assist for medications management    Equipment Recommendations    Recommendations for Other Services       Functional Status Assessment Patient has had a recent decline in their functional status and demonstrates the ability to make significant improvements in function in a reasonable and predictable amount of time.     Precautions / Restrictions Precautions Precautions: Fall Restrictions Weight Bearing Restrictions: No      Mobility  Bed Mobility Overal bed mobility: Needs Assistance             General bed mobility comments: Dep/total assist for all repositioning in bed    Transfers                   General transfer comment: unsafe/unable    Ambulation/Gait               General Gait Details: unsafe/unable  Stairs            Wheelchair Mobility    Modified Rankin (Stroke Patients Only)       Balance                                             Pertinent Vitals/Pain Pain Assessment Pain Assessment: Faces Faces Pain Scale: Hurts little more Pain Location: L UE Pain Descriptors / Indicators: Grimacing, Moaning Pain  Intervention(s): Limited activity within patient's tolerance, Monitored during session, Repositioned    Home Living Family/patient expects to be discharged to:: Private residence Living Arrangements: Alone Available Help at Discharge: Family;Available PRN/intermittently Type of Home: House         Home  Layout: One level Home Equipment: Rollator (4 wheels);Shower seat;Transport chair;Grab bars - tub/shower;Other (comment)      Prior Function Prior Level of Function : History of Falls (last six months)             Mobility Comments: Normally Mod I using rollator for community distances, pt/son endorse recently using transport chair for home mobility. Reports 1 recent fall 4 weeks ago while ambulating outside ADLs Comments: Normally IND for ADLs. Pt endorsed her cousin has been assisting with "everything" recently (bathing, dressing, cooking, driving, household chores).     Hand Dominance   Dominant Hand: Right    Extremity/Trunk Assessment   Upper Extremity Assessment Upper Extremity Assessment: Generalized weakness (grossly 2+ to 3-/5 throughout; difficulty with command following and active effort due to lethargy)    Lower Extremity Assessment Lower Extremity Assessment: Generalized weakness (grossly 3-/5 throughout; difficulty with command following and active effort due to lethargy)       Communication   Communication: No difficulties  Cognition Arousal/Alertness: Lethargic Behavior During Therapy: Flat affect Overall Cognitive Status: Difficult to assess (due to lethargy)                                          General Comments      Exercises Other Exercises Other Exercises: Supine LE therex, 1x10, act assist/passive ROM to bilat LEs: ankle pumps, heel slides, hip abduct/adduct.  Constant cuing for alertness throughout session; opens eyes, but unable to maintain for functional participation with progressive mobility.  Unchanged with efforts at therex or repositioning in bed.   Assessment/Plan    PT Assessment Patient needs continued PT services  PT Problem List Decreased strength;Decreased activity tolerance;Decreased balance;Decreased mobility;Cardiopulmonary status limiting activity       PT Treatment Interventions DME instruction;Gait  training;Therapeutic exercise;Balance training    PT Goals (Current goals can be found in the Care Plan section)  Acute Rehab PT Goals Patient Stated Goal: per son, hopeful for improvement and return with home health PT Goal Formulation: With patient/family Time For Goal Achievement: 02/13/23 Potential to Achieve Goals: Good    Frequency Min 2X/week (plan to incresae as alertness improves)     Co-evaluation               AM-PAC PT "6 Clicks" Mobility  Outcome Measure Help needed turning from your back to your side while in a flat bed without using bedrails?: Total Help needed moving from lying on your back to sitting on the side of a flat bed without using bedrails?: Total Help needed moving to and from a bed to a chair (including a wheelchair)?: Total Help needed standing up from a chair using your arms (e.g., wheelchair or bedside chair)?: Total Help needed to walk in hospital room?: Total Help needed climbing 3-5 steps with a railing? : Total 6 Click Score: 6    End of Session Equipment Utilized During Treatment: Oxygen Activity Tolerance: Patient limited by lethargy Patient left: in bed;with call bell/phone within reach;with bed alarm set;with family/visitor present Nurse Communication: Mobility status PT Visit Diagnosis: Unsteadiness on feet (R26.81);Muscle weakness (generalized) (M62.81);History of falling (  Z91.81);Difficulty in walking, not elsewhere classified (R26.2)    Time: 1610-9604 PT Time Calculation (min) (ACUTE ONLY): 12 min   Charges:   PT Evaluation $PT Re-evaluation: 1 Re-eval          Terry Abila H. Manson Passey, PT, DPT, NCS 01/30/23, 10:42 AM [phone]

## 2023-01-31 ENCOUNTER — Inpatient Hospital Stay: Payer: Medicare Other

## 2023-01-31 ENCOUNTER — Encounter: Payer: Self-pay | Admitting: Internal Medicine

## 2023-01-31 DIAGNOSIS — K264 Chronic or unspecified duodenal ulcer with hemorrhage: Secondary | ICD-10-CM | POA: Diagnosis not present

## 2023-01-31 DIAGNOSIS — J189 Pneumonia, unspecified organism: Secondary | ICD-10-CM

## 2023-01-31 DIAGNOSIS — A419 Sepsis, unspecified organism: Secondary | ICD-10-CM | POA: Diagnosis not present

## 2023-01-31 DIAGNOSIS — E870 Hyperosmolality and hypernatremia: Secondary | ICD-10-CM | POA: Insufficient documentation

## 2023-01-31 DIAGNOSIS — D696 Thrombocytopenia, unspecified: Secondary | ICD-10-CM | POA: Insufficient documentation

## 2023-01-31 DIAGNOSIS — K922 Gastrointestinal hemorrhage, unspecified: Secondary | ICD-10-CM

## 2023-01-31 DIAGNOSIS — J9621 Acute and chronic respiratory failure with hypoxia: Secondary | ICD-10-CM | POA: Diagnosis not present

## 2023-01-31 LAB — CBC
HCT: 32.1 % — ABNORMAL LOW (ref 36.0–46.0)
Hemoglobin: 10.6 g/dL — ABNORMAL LOW (ref 12.0–15.0)
MCH: 28.9 pg (ref 26.0–34.0)
MCHC: 33 g/dL (ref 30.0–36.0)
MCV: 87.5 fL (ref 80.0–100.0)
Platelets: 81 10*3/uL — ABNORMAL LOW (ref 150–400)
RBC: 3.67 MIL/uL — ABNORMAL LOW (ref 3.87–5.11)
RDW: 18.2 % — ABNORMAL HIGH (ref 11.5–15.5)
WBC: 8.7 10*3/uL (ref 4.0–10.5)
nRBC: 1 % — ABNORMAL HIGH (ref 0.0–0.2)

## 2023-01-31 LAB — BASIC METABOLIC PANEL
Anion gap: 6 (ref 5–15)
BUN: 36 mg/dL — ABNORMAL HIGH (ref 8–23)
CO2: 23 mmol/L (ref 22–32)
Calcium: 6.1 mg/dL — CL (ref 8.9–10.3)
Chloride: 119 mmol/L — ABNORMAL HIGH (ref 98–111)
Creatinine, Ser: 0.84 mg/dL (ref 0.44–1.00)
GFR, Estimated: >60 mL/min (ref >60)
Glucose, Bld: 170 mg/dL — ABNORMAL HIGH (ref 70–99)
Potassium: 3.6 mmol/L (ref 3.5–5.1)
Sodium: 148 mmol/L — ABNORMAL HIGH (ref 135–145)

## 2023-01-31 LAB — HEPATIC FUNCTION PANEL
ALT: 12 U/L (ref 0–44)
AST: 38 U/L (ref 15–41)
Albumin: 1.9 g/dL — ABNORMAL LOW (ref 3.5–5.0)
Alkaline Phosphatase: 37 U/L — ABNORMAL LOW (ref 38–126)
Bilirubin, Direct: 0.2 mg/dL (ref 0.0–0.2)
Indirect Bilirubin: 0.6 mg/dL (ref 0.3–0.9)
Total Bilirubin: 0.8 mg/dL (ref 0.3–1.2)
Total Protein: 4.2 g/dL — ABNORMAL LOW (ref 6.5–8.1)

## 2023-01-31 LAB — PHOSPHORUS: Phosphorus: 1.4 mg/dL — ABNORMAL LOW (ref 2.5–4.6)

## 2023-01-31 LAB — MAGNESIUM: Magnesium: 2.3 mg/dL (ref 1.7–2.4)

## 2023-01-31 LAB — GLUCOSE, CAPILLARY: Glucose-Capillary: 139 mg/dL — ABNORMAL HIGH (ref 70–99)

## 2023-01-31 MED ORDER — POTASSIUM PHOSPHATES 15 MMOLE/5ML IV SOLN
30.0000 mmol | Freq: Once | INTRAVENOUS | Status: AC
  Administered 2023-01-31: 30 mmol via INTRAVENOUS
  Filled 2023-01-31: qty 10

## 2023-01-31 MED ORDER — PIPERACILLIN-TAZOBACTAM 4.5 G IVPB
4.5000 g | Freq: Three times a day (TID) | INTRAVENOUS | Status: DC
  Administered 2023-01-31 – 2023-02-01 (×3): 4.5 g via INTRAVENOUS
  Filled 2023-01-31 (×4): qty 100

## 2023-01-31 MED ORDER — SODIUM CHLORIDE 0.9 % IV BOLUS: 1000.0000 mL | Freq: Once | INTRAVENOUS | Status: DC

## 2023-01-31 MED ORDER — METOLAZONE 5 MG PO TABS
5.0000 mg | ORAL_TABLET | Freq: Once | ORAL | Status: AC
  Administered 2023-01-31: 5 mg via ORAL
  Filled 2023-01-31: qty 1

## 2023-01-31 NOTE — Hospital Course (Addendum)
82 yo female with stage 4 lung cancer with mets to the bone s/p neck radiation due to cervical mets (dx November 21, 2022) to the left neck.  She presented to [facility] ER on 05/7 with fevers, tachycardia, and hypoxia.  At baseline pt wears 2L O2 via nasal canula.  She has been followed by oncology and received her first dose of keytruda on 01/22/23.  She was recently taken off her antihypertensives due to hypotension and s/p dex taper.     Upon arrival to the ER pts O2 sats were 84% on baseline O2@2L  via nasal canula requiring increase in O2 to 4L with improvement in oxygenation.  CXR concerning for pneumonia with trace pleural effusions and unchanged left apical mass.  Sepsis protocol initiated and pt received 1,750 ml iv fluid bolus, metronidazole, vancomycin, and cefepime.  Pt remained hypotensive requiring low dose levophed gtt.  PCCM team contacted for ICU admission.  Pt was off pressor and transferred to hospitalist service on 01/24/23. Patient condition does not seem to be improving, CT chest performed on 5/15 showed bilateral upper lobe worsening pneumonia with cavitary lesion, consistent with aspiration pneumonia.  Zosyn was started. Condition improved, antibiotic changed to amoxicillin.  Pending nursing home placement.

## 2023-01-31 NOTE — Telephone Encounter (Signed)
Spoke to pt's son re: results of the CT scan. Will discuss with primary team/pulmonary for their recommendations.  GB

## 2023-01-31 NOTE — Progress Notes (Addendum)
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  She presented to [facility] on 05/7 with fevers, tachycardia, and hypoxia.  At baseline pt wears 2L O2 via nasal canula.  She has been followed by oncology and received her first dose of keytruda on 01/22/23.  She was recently taken off her antihypertensives due to hypotension and s/p dex taper.     Upon arrival to the ER pts O2 sats were 84% on baseline O2@2L  via nasal canula requiring increase in O2 to 4L with improvement in oxygenation.  CXR concerning for pneumonia with trace pleural effusions and unchanged left apical mass.  Sepsis protocol initiated and pt received 1,750 ml iv fluid bolus, metronidazole, vancomycin, and cefepime.  Pt remained hypotensive requiring low dose levophed gtt.  PCCM team contacted for ICU admission.  Pt was off pressor and transferred to hospitalist service on 01/24/23.   Principal Problem:   Septic shock (HCC) Active Problems:   Stage 4 lung cancer (HCC)   Pneumonia of right upper lobe due to infectious organism   Palliative care encounter   Pressure injury of skin   Malnutrition of moderate degree   Gastrointestinal hemorrhage   Hypernatremia   Hypophosphatemia   Thrombocytopenia (HCC)   Duodenal ulcer with hemorrhage   Acute on chronic respiratory failure with hypoxia (HCC)   Assessment and Plan: #Septic and hypovolemic shock  #Mildly elevated troponin likely secondary to demand ischemia in the setting of sepsis Bilateral upper lobe pneumonia. Patient condition appears to be improving, no longer has any hypotension.  She has completed course of antibiotics, still on steroid taper per recommendation from pulmonology. I repeated chest x-ray today, patient  appears to have bilateral upper lobe infiltrates, it appears to involve the whole right upper lobe.  I will obtain a CT scan to make sure this not a metabolic lung cancer.   # AKI secondary to septic shock.  Ruled in. Hyponatremia. Hypokalemia. Hypophosphatemia. Patient initial creatinine was 1.44, baseline 0.72, patient met criteria for acute kidney injury. Patient sodium level still elevated 148, potassium better.  Phosphorus level 1.4 today.  She received 30 mmol of potassium phosphate today. Patient does not have any dehydration, she will be given a dose of metolazone for hyponatremia.    # acute blood loss anemia #Iron deficiency anemia   # Upper GI bleed 2/2 duodenal ulcer # gastritis --coffee ground emesis and large melanotic stool on 5/9 --EGD 5/9 found large ulcer just past the pylorus in the duodenum. Another ulcer in the pylorus. The culprit lesion was the duodenal ulcer which was treated.  --found re-bleeding morning of 5/13, vascular performed coil embolization of the pancreaticcoduodenal artery and gastroduodenal artery. Patient still on PPI IV twice a day, hemoglobin seem to be better.   #Acute on chronic hypoxic respiratory failure  Chronic O2 @2L  O2 and Current Everyday Smoker --2/2 PNA in the setting of lung cancer --increased O2 requirement on 5/11 to 7L.  Did get IVF in the past days.  O2 down to 4L next day after diuresis.  Though should consider pembro induced lung toxicity at this point, per pulm. Patient still on 4 L oxygen, continue to follow.   #Right pleural effusion 2/2  parapneumonic effusion --thoracentesis on 5/8, with 330 ml removed Lab test from the thoracentesis showed exudates, but the majority of the white cells are lymphocytes, not infectious.  Cytology did not see any malignant cells.   #Stage IV lung cancer with mets to the bone .Chest CT scan today.   Non-severe (moderate) malnutrition in context of chronic illness  --supplements per dietician      Addendum: 1655.  Reviewed CT scan, worsening bilateral upper lobe pneumonia. I am worried about obstructive pneumonia from cancer. Will consult oncology and pulmonary. Check procal, restart abx with zosyn.      Subjective:  Patient has some confusion, does not feel short of breath.  Has a cough, nonproductive.  Physical Exam: Vitals:   01/31/23 0900 01/31/23 1000 01/31/23 1100 01/31/23 1200  BP: 105/64 102/78 117/60 (!) 104/53  Pulse: 85 (!) 103 90 76  Resp: (!) 29 17 16 15   Temp:      TempSrc:      SpO2: 95% 92% 94% 96%  Weight:      Height:       General exam: Appears calm and comfortable  Respiratory system: Decreased breath sounds. Respiratory effort normal. Cardiovascular system: S1 & S2 heard, RRR. No JVD, murmurs, rubs, gallops or clicks. No pedal edema. Gastrointestinal system: Abdomen is nondistended, soft and nontender. No organomegaly or masses felt. Normal bowel sounds heard. Central nervous system: Alert and oriented x2. No focal neurological deficits. Extremities: Symmetric 5 x 5 power. Skin: No rashes, lesions or ulcers Psychiatry: Judgement and insight appear normal. Mood & affect appropriate.    Data Reviewed:  Reviewed x-ray results, independently reviewed chest x-ray images.  Reviewed lab results.  Family Communication: Son updated at bedside.  Disposition: Status is: Inpatient Remains inpatient appropriate because: Severity of disease,     Time spent: 55 minutes  Author: Marrion Coy, MD 01/31/2023 1:26 PM  For on call review [email or web address].

## 2023-01-31 NOTE — Evaluation (Addendum)
Occupational Therapy Re-Evaluation Patient Details Name: Crystal Mcmahon MRN: [id number] DOB: March 05, 1941 Today's Date: 01/31/2023   History of Present Illness Pt admitted for sepsis due to PNA with complaints of fever, hypoxia, and tachy. 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   Clinical Impression   Patient received for OT re-evaluation; Now s/p emobolization of ulcer. See flowsheet below for details of function. Generally, patient requiring MOD Ax2 for bed mobility, unable to perform transfer or functional mobility today 2/2 weakness, and MIN-MAX A for ADLs.  Patient will benefit from continued OT while in acute care.     Recommendations for follow up therapy are one component of a multi-disciplinary discharge planning process, led by the attending physician.  Recommendations may be updated based on patient status, additional functional criteria and insurance authorization.   Assistance Recommended at Discharge Frequent or constant Supervision/Assistance  Patient can return home with the following Two people to help with walking and/or transfers;A lot of help with bathing/dressing/bathroom;Assistance with cooking/housework;Assist for transportation;Help with stairs or ramp for entrance    Functional Status Assessment  Patient has had a recent decline in their functional status and demonstrates the ability to make significant improvements in function in a reasonable and predictable amount of time.  Equipment Recommendations  Other (comment) (defer to next venue of care)    Recommendations for Other Services       Precautions / Restrictions Precautions Precautions: Fall Restrictions Weight Bearing Restrictions: No      Mobility Bed Mobility Overal bed mobility: Needs  Assistance Bed Mobility: Supine to Sit, Sit to Supine     Supine to sit: Mod assist, +2 for physical assistance Sit to supine: Mod assist, +2 for physical assistance   General bed mobility comments: dependent for repositioning once in bed. Pt trying to assist with bed mobility with UE, but too weak to reach hand rails.    Transfers                   General transfer comment: Not safe to t/f to standing today. Unable to scoot at EOB 2/2 weakness.      Balance Overall balance assessment: Needs assistance, History of Falls Sitting-balance support: Bilateral upper extremity supported, Feet unsupported (feet unable to reach the floor due to high ICU bed) Sitting balance-Leahy Scale: Fair Sitting balance - Comments: Pt intermittently leaning back 2/2 weakness; poor ability to control sitting posture; OT assistance to remain upright intermittently throughout session. Postural control: Posterior lean                                 ADL either performed or assessed with clinical judgement   ADL Overall ADL's : Needs assistance/impaired Eating/Feeding: Minimal assistance Eating/Feeding Details (indicate cue type and reason): Pt requiring assistance at EOB to hold coffee cup due to BIL UE weakness; pt able to reach out to hold handle of mug, but unable to support full weight of full coffee cup without assistance. Pt able to hold spoon in R hand and eat a few bites of magic cup that OT placed on spoon; pt lethargic and needing cues to stay awake. Pt with deep coughs after some bites. O2 rising after pt coughing.  General ADL Comments: Pt will require extensive assist for all UB/LB dressing, bathing (bed level only at this time), grooming (set up-MIN A bed level due to fatigue). Not yet safe to t/f to toilet; depedent bed level toileting.      Pt requiring MAX A for rolling at end of session to change bed pads; pt very  fatigued.   Vision Baseline Vision/History: 1 Wears glasses Ability to See in Adequate Light: 0 Adequate (has glasses on during session) Patient Visual Report: No change from baseline       Perception     Praxis      Pertinent Vitals/Pain Pain Assessment Pain Assessment: No/denies pain     Hand Dominance Right   Extremity/Trunk Assessment Upper Extremity Assessment Upper Extremity Assessment: Generalized weakness (Grossly 3-/5.)   Lower Extremity Assessment Lower Extremity Assessment: Generalized weakness;Defer to PT evaluation       Communication Communication Communication: No difficulties   Cognition Arousal/Alertness: Lethargic Behavior During Therapy: Flat affect Overall Cognitive Status: Difficult to assess                                 General Comments: Pt fatigued; intermittently not answering OT's questions. Decreased processing speed. Reports year as 2034.     General Comments  On 3L O2; sats 96% at rest; decreasing slightly with sitting up, but improved after pt coughing.    Exercises     Shoulder Instructions      Home Living Family/patient expects to be discharged to:: Private residence Living Arrangements: Alone Available Help at Discharge: Family;Available PRN/intermittently Type of Home: House       Home Layout: One level     Bathroom Shower/Tub: Producer, television/film/video: Handicapped height     Home Equipment: Rollator (4 wheels);Shower seat;Transport chair;Grab bars - tub/shower;Other (comment)   Additional Comments: Son lives in [place].      Prior Functioning/Environment Prior Level of Function : History of Falls (last six months)             Mobility Comments: Normally Mod I using rollator for community distances, pt/son endorse recently using transport chair for home mobility. Reports 1 recent fall 4 weeks ago while ambulating outside ADLs Comments: Normally IND for ADLs. Pt endorsed her cousin has  been assisting with "everything" recently (bathing, dressing, cooking, driving, household chores).        OT Problem List: Decreased strength;Decreased activity tolerance;Impaired balance (sitting and/or standing);Cardiopulmonary status limiting activity      OT Treatment/Interventions: Self-care/ADL training;Therapeutic exercise;Energy conservation;DME and/or AE instruction;Therapeutic activities;Patient/family education;Balance training    OT Goals(Current goals can be found in the care plan section) Acute Rehab OT Goals Patient Stated Goal: Get better OT Goal Formulation: With patient/family Time For Goal Achievement: 02/14/23 Potential to Achieve Goals: Good  OT Frequency: Min 3X/week    Co-evaluation PT/OT/SLP Co-Evaluation/Treatment: Yes Reason for Co-Treatment: Complexity of the patient's impairments (multi-system involvement);For patient/therapist safety PT goals addressed during session: Mobility/safety with mobility OT goals addressed during session: ADL's and self-care      AM-PAC OT "6 Clicks" Daily Activity     Outcome Measure Help from another person eating meals?: A Little Help from another person taking care of personal grooming?: A Little Help from another person toileting, which includes using toliet, bedpan, or urinal?: Total Help from another person bathing (including washing, rinsing, drying)?: Total Help from another person to put on and taking off  regular upper body clothing?: A Lot Help from another person to put on and taking off regular lower body clothing?: Total 6 Click Score: 11   End of Session Equipment Utilized During Treatment: Oxygen Nurse Communication: Mobility status  Activity Tolerance: Patient limited by fatigue Patient left: in bed;with call bell/phone within reach;with family/visitor present (college roommate and cousin in room with pt; set up to eat lunch with supervision (due to lethargy); instruction provided to famiy/friend to ensure  pt awake during eating.)  OT Visit Diagnosis: Other abnormalities of gait and mobility (R26.89);Muscle weakness (generalized) (M62.81);History of falling (Z91.81)                Time: 0935-1001 OT Time Calculation (min): 26 min Charges:  OT General Charges $OT Visit: 1 Visit OT Evaluation $OT Re-eval: 1 Re-eval  Linward Foster, MS, OTR/L  Alvester Morin 01/31/2023, 11:15 AM

## 2023-01-31 NOTE — Progress Notes (Signed)
Physical Therapy Treatment Patient Details Name: Crystal Mcmahon MRN: [id number] DOB: Jul 07, 1941 Today's Date: 01/31/2023   History of Present Illness Pt admitted for sepsis due to PNA with complaints of fever, hypoxia, and tachy. 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    PT Comments    Co-treat performed with OT this date. Pt sleeping upon arrival, however able to awaken and participate. Does fatigue quickly while seated at EOB with increasing post lean and constant assist to maintain upright posture. Productive coughing noted while seated. Able to participate with ADLs. Noted BM smear on chux, assisted in rolling with +2 assist for linen replacement. Will continue to progress as able.  Recommendations for follow up therapy are one component of a multi-disciplinary discharge planning process, led by the attending physician.  Recommendations may be updated based on patient status, additional functional criteria and insurance authorization.  Follow Up Recommendations  Can patient physically be transported by private vehicle: No    Assistance Recommended at Discharge Frequent or constant Supervision/Assistance  Patient can return home with the following Two people to help with walking and/or transfers;A lot of help with bathing/dressing/bathroom;Help with stairs or ramp for entrance;Assistance with cooking/housework;Assistance with feeding;Assist for transportation;Direct supervision/assist for medications management   Equipment Recommendations  Rolling walker (2 wheels)    Recommendations for Other Services       Precautions / Restrictions Precautions Precautions: Fall Restrictions Weight Bearing Restrictions: No     Mobility  Bed Mobility Overal bed mobility: Needs  Assistance Bed Mobility: Supine to Sit, Sit to Supine, Rolling Rolling: Max assist, +2 for physical assistance   Supine to sit: Mod assist, +2 for physical assistance Sit to supine: Mod assist, +2 for physical assistance   General bed mobility comments: needs assist for rolling for linen replacement. Once seated at EOB, all mobility performed on 3L of O2 with O2 sats ranging from 85-94%. Able to sit at EOB for prolonged time    Transfers                   General transfer comment: not safe due to inconsistent arousal level    Ambulation/Gait                   Stairs             Wheelchair Mobility    Modified Rankin (Stroke Patients Only)       Balance Overall balance assessment: Needs assistance, History of Falls Sitting-balance support: Bilateral upper extremity supported, Feet unsupported Sitting balance-Leahy Scale: Fair Sitting balance - Comments: Pt intermittently leaning back 2/2 weakness; poor ability to control sitting posture; OT assistance to remain upright intermittently throughout session. Postural control: Posterior lean                                  Cognition Arousal/Alertness: Lethargic Behavior During Therapy: Flat affect Overall Cognitive Status: Difficult to assess                                 General Comments: Pt reports year is 2034, however appears oriented to situation. Able to state name and place  Exercises Other Exercises Other Exercises: once seated at EOB, able to perform LAQ x 3-4 reps with supervision Other Exercises: Assisted in drinking water, coffee, and eating ice cream. Needs mod assist for advancing cup to mouth. Fatigues quickly    General Comments        Pertinent Vitals/Pain Pain Assessment Pain Assessment: No/denies pain    Home Living Family/patient expects to be discharged to:: Private residence Living Arrangements: Alone Available Help at Discharge:  Family;Available PRN/intermittently Type of Home: House         Home Layout: One level Home Equipment: Rollator (4 wheels);Shower seat;Transport chair;Grab bars - tub/shower;Other (comment) Additional Comments: Son lives in [place].    Prior Function            PT Goals (current goals can now be found in the care plan section) Acute Rehab PT Goals Patient Stated Goal: per son, hopeful for improvement and return with home health PT Goal Formulation: With patient/family Time For Goal Achievement: 02/13/23 Potential to Achieve Goals: Good Progress towards PT goals: Progressing toward goals    Frequency    Min 2X/week      PT Plan Current plan remains appropriate    Co-evaluation PT/OT/SLP Co-Evaluation/Treatment: Yes Reason for Co-Treatment: Complexity of the patient's impairments (multi-system involvement);For patient/therapist safety PT goals addressed during session: Mobility/safety with mobility OT goals addressed during session: ADL's and self-care      AM-PAC PT "6 Clicks" Mobility   Outcome Measure  Help needed turning from your back to your side while in a flat bed without using bedrails?: Total Help needed moving from lying on your back to sitting on the side of a flat bed without using bedrails?: A Lot Help needed moving to and from a bed to a chair (including a wheelchair)?: Total Help needed standing up from a chair using your arms (e.g., wheelchair or bedside chair)?: Total Help needed to walk in hospital room?: Total Help needed climbing 3-5 steps with a railing? : Total 6 Click Score: 7    End of Session Equipment Utilized During Treatment: Oxygen Activity Tolerance: Patient limited by lethargy Patient left: in bed;with call bell/phone within reach;with bed alarm set;with family/visitor present Nurse Communication: Mobility status PT Visit Diagnosis: Unsteadiness on feet (R26.81);Muscle weakness (generalized) (M62.81);History of falling  (Z91.81);Difficulty in walking, not elsewhere classified (R26.2)     Time: 1610-9604 PT Time Calculation (min) (ACUTE ONLY): 27 min  Charges:  $Therapeutic Activity: 8-22 mins                     Elizabeth Palau, PT, DPT, GCS [phone]    Crystal Mcmahon 01/31/2023, 10:36 AM

## 2023-02-01 ENCOUNTER — Encounter: Payer: Self-pay | Admitting: Internal Medicine

## 2023-02-01 ENCOUNTER — Other Ambulatory Visit: Payer: Self-pay | Admitting: Internal Medicine

## 2023-02-01 DIAGNOSIS — J690 Pneumonitis due to inhalation of food and vomit: Secondary | ICD-10-CM | POA: Diagnosis not present

## 2023-02-01 DIAGNOSIS — R6521 Severe sepsis with septic shock: Secondary | ICD-10-CM | POA: Diagnosis not present

## 2023-02-01 DIAGNOSIS — C3490 Malignant neoplasm of unspecified part of unspecified bronchus or lung: Secondary | ICD-10-CM | POA: Diagnosis not present

## 2023-02-01 DIAGNOSIS — A419 Sepsis, unspecified organism: Secondary | ICD-10-CM | POA: Diagnosis not present

## 2023-02-01 DIAGNOSIS — J9621 Acute and chronic respiratory failure with hypoxia: Secondary | ICD-10-CM | POA: Diagnosis not present

## 2023-02-01 LAB — BASIC METABOLIC PANEL
Anion gap: 10 (ref 5–15)
BUN: 34 mg/dL — ABNORMAL HIGH (ref 8–23)
CO2: 23 mmol/L (ref 22–32)
Calcium: 6.4 mg/dL — CL (ref 8.9–10.3)
Chloride: 114 mmol/L — ABNORMAL HIGH (ref 98–111)
Creatinine, Ser: 0.92 mg/dL (ref 0.44–1.00)
GFR, Estimated: >60 mL/min (ref >60)
Glucose, Bld: 85 mg/dL (ref 70–99)
Potassium: 3.4 mmol/L — ABNORMAL LOW (ref 3.5–5.1)
Sodium: 147 mmol/L — ABNORMAL HIGH (ref 135–145)

## 2023-02-01 LAB — MAGNESIUM: Magnesium: 2.1 mg/dL (ref 1.7–2.4)

## 2023-02-01 LAB — EXPECTORATED SPUTUM ASSESSMENT W GRAM STAIN, RFLX TO RESP C

## 2023-02-01 LAB — CBC
HCT: 32.4 % — ABNORMAL LOW (ref 36.0–46.0)
Hemoglobin: 10.3 g/dL — ABNORMAL LOW (ref 12.0–15.0)
MCH: 28.6 pg (ref 26.0–34.0)
MCHC: 31.8 g/dL (ref 30.0–36.0)
MCV: 90 fL (ref 80.0–100.0)
Platelets: 103 10*3/uL — ABNORMAL LOW (ref 150–400)
RBC: 3.6 MIL/uL — ABNORMAL LOW (ref 3.87–5.11)
RDW: 18.7 % — ABNORMAL HIGH (ref 11.5–15.5)
WBC: 8 10*3/uL (ref 4.0–10.5)
nRBC: 0.5 % — ABNORMAL HIGH (ref 0.0–0.2)

## 2023-02-01 LAB — PHOSPHORUS: Phosphorus: 2.1 mg/dL — ABNORMAL LOW (ref 2.5–4.6)

## 2023-02-01 LAB — HEMOGLOBIN: Hemoglobin: 10.7 g/dL — ABNORMAL LOW (ref 12.0–15.0)

## 2023-02-01 LAB — PROCALCITONIN: Procalcitonin: 0.2 ng/mL

## 2023-02-01 MED ORDER — PIPERACILLIN-TAZOBACTAM 3.375 G IVPB
3.3750 g | Freq: Three times a day (TID) | INTRAVENOUS | Status: DC
  Administered 2023-02-01 – 2023-02-06 (×15): 3.375 g via INTRAVENOUS
  Filled 2023-02-01 (×15): qty 50

## 2023-02-01 MED ORDER — ARFORMOTEROL TARTRATE 15 MCG/2ML IN NEBU
15.0000 ug | INHALATION_SOLUTION | Freq: Two times a day (BID) | RESPIRATORY_TRACT | Status: DC
  Administered 2023-02-01 – 2023-02-07 (×11): 15 ug via RESPIRATORY_TRACT
  Filled 2023-02-01 (×13): qty 2

## 2023-02-01 MED ORDER — POTASSIUM CHLORIDE 20 MEQ PO PACK
40.0000 meq | PACK | Freq: Once | ORAL | Status: AC
  Administered 2023-02-01: 40 meq via ORAL
  Filled 2023-02-01: qty 2

## 2023-02-01 MED ORDER — REVEFENACIN 175 MCG/3ML IN SOLN
175.0000 ug | Freq: Every day | RESPIRATORY_TRACT | Status: DC
  Administered 2023-02-02 – 2023-02-07 (×6): 175 ug via RESPIRATORY_TRACT
  Filled 2023-02-01 (×7): qty 3

## 2023-02-01 MED ORDER — POTASSIUM PHOSPHATES 15 MMOLE/5ML IV SOLN
15.0000 mmol | Freq: Once | INTRAVENOUS | Status: AC
  Administered 2023-02-01: 15 mmol via INTRAVENOUS
  Filled 2023-02-01: qty 5

## 2023-02-01 MED ORDER — METOLAZONE 5 MG PO TABS
5.0000 mg | ORAL_TABLET | Freq: Once | ORAL | Status: AC
  Administered 2023-02-01: 5 mg via ORAL
  Filled 2023-02-01: qty 1

## 2023-02-01 MED ORDER — ALBUTEROL SULFATE (2.5 MG/3ML) 0.083% IN NEBU: 2.5000 mg | INHALATION_SOLUTION | Freq: Four times a day (QID) | RESPIRATORY_TRACT | Status: DC | PRN

## 2023-02-01 NOTE — Progress Notes (Signed)
Physical Therapy Treatment Patient Details Name: Crystal Mcmahon MRN: [id number] DOB: 11/24/1940 Today's Date: 02/01/2023   History of Present Illness Pt admitted for sepsis due to PNA with complaints of fever, hypoxia, and tachy. 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    PT Comments    PT/OT co-treatment performed.  During session pt 2 assist with bed mobility (pt incontinent of BM requiring assist for clean-up and bed linen change); close SBA to mod assist for sitting balance (d/t posterior lean at times); and unable to stand up (x2 trials) with 2 assist (pt able to just clear her bottom from bed on 2nd trial).  Pt fatigues with activity requiring pacing and rest breaks.  Will continue to focus on strengthening, balance, and progressive functional mobility during hospitalization.    Recommendations for follow up therapy are one component of a multi-disciplinary discharge planning process, led by the attending physician.  Recommendations may be updated based on patient status, additional functional criteria and insurance authorization.  Follow Up Recommendations  Can patient physically be transported by private vehicle: No    Assistance Recommended at Discharge Frequent or constant Supervision/Assistance  Patient can return home with the following Two people to help with walking and/or transfers;A lot of help with bathing/dressing/bathroom;Help with stairs or ramp for entrance;Assistance with cooking/housework;Assistance with feeding;Assist for transportation;Direct supervision/assist for medications management   Equipment Recommendations  Other (comment) (TBD at next facility)    Recommendations for Other Services       Precautions / Restrictions Precautions Precautions:  Fall Restrictions Weight Bearing Restrictions: No     Mobility  Bed Mobility Overal bed mobility: Needs Assistance Bed Mobility: Supine to Sit, Sit to Supine, Rolling Rolling: Mod assist, +2 for safety/equipment (pt assisting with bending knees for logrolling)   Supine to sit: Min assist, Mod assist, +2 for physical assistance Sit to supine: Total assist, +2 for physical assistance   General bed mobility comments: logrolling in bed for clean-up and to place new bed linens d/t pt incontinent of BM; assist for trunk and B LE's; vc's for technique    Transfers Overall transfer level: Needs assistance Equipment used: 2 person hand held assist (use of chuck pad under pt's bottom to assist with standing) Transfers: Sit to/from Stand Sit to Stand: Max assist, +2 physical assistance           General transfer comment: x2 standing attempts: 1st trial pt's bottom did not clear bed but 2nd trial pt's bottom just cleared bed when attempting to stand; vc's for technique    Ambulation/Gait               General Gait Details: unable to stand to attempt   Stairs             Wheelchair Mobility    Modified Rankin (Stroke Patients Only)       Balance Overall balance assessment: Needs assistance, History of Falls Sitting-balance support: Feet supported Sitting balance-Leahy Scale: Fair Sitting balance - Comments: steady static sitting at times but pt intermittently leaning back 2/2 weakness requiring assist for upright balance Postural control: Posterior lean     Standing balance comment: unable to fully stand today  Cognition Arousal/Alertness: Lethargic Behavior During Therapy: Flat affect Overall Cognitive Status: Difficult to assess                                 General Comments: Pt with waxing/waning alertness (less alert end of session with pt fatigued but pt would answer therapist with extra cues)         Exercises      General Comments      Pertinent Vitals/Pain Pain Assessment Pain Assessment: No/denies pain Pain Intervention(s): Limited activity within patient's tolerance, Monitored during session, Repositioned Vitals (HR and O2 on 1.5 L supplemental O2 via nasal cannula) stable and WFL throughout treatment session.    Home Living                          Prior Function            PT Goals (current goals can now be found in the care plan section) Acute Rehab PT Goals Patient Stated Goal: per son, hopeful for improvement and return with home health PT Goal Formulation: With patient/family Time For Goal Achievement: 02/13/23 Potential to Achieve Goals: Good Progress towards PT goals: Progressing toward goals    Frequency    Min 2X/week      PT Plan Current plan remains appropriate    Co-evaluation PT/OT/SLP Co-Evaluation/Treatment: Yes Reason for Co-Treatment: Complexity of the patient's impairments (multi-system involvement);For patient/therapist safety PT goals addressed during session: Mobility/safety with mobility;Balance OT goals addressed during session: ADL's and self-care;Strengthening/ROM      AM-PAC PT "6 Clicks" Mobility   Outcome Measure  Help needed turning from your back to your side while in a flat bed without using bedrails?: A Lot Help needed moving from lying on your back to sitting on the side of a flat bed without using bedrails?: Total Help needed moving to and from a bed to a chair (including a wheelchair)?: Total Help needed standing up from a chair using your arms (e.g., wheelchair or bedside chair)?: Total Help needed to walk in hospital room?: Total Help needed climbing 3-5 steps with a railing? : Total 6 Click Score: 7    End of Session Equipment Utilized During Treatment: Oxygen (1.5 L via nasal cannula) Activity Tolerance: Patient limited by fatigue Patient left: in bed;with call bell/phone within reach;with bed  alarm set;with family/visitor present Nurse Communication: Mobility status;Precautions;Other (comment) (nurse came to assess pt's BM (darker in color)) PT Visit Diagnosis: Unsteadiness on feet (R26.81);Muscle weakness (generalized) (M62.81);History of falling (Z91.81);Difficulty in walking, not elsewhere classified (R26.2)     Time: 1610-9604 PT Time Calculation (min) (ACUTE ONLY): 44 min  Charges:  $Therapeutic Activity: 23-37 mins                     Hendricks Limes, PT 02/01/23, 4:54 PM

## 2023-02-01 NOTE — Evaluation (Signed)
Clinical/Bedside Swallow Evaluation Patient Details  Name: Crystal Mcmahon MRN: [id number] Date of Birth: Sep 06, 1941  Today's Date: 02/01/2023 Time: SLP Start Time (ACUTE ONLY): 1155 SLP Stop Time (ACUTE ONLY): 1215 SLP Time Calculation (min) (ACUTE ONLY): 20 min  Past Medical History:  Past Medical History:  Diagnosis Date   Actinic keratosis    Basal cell carcinoma 06/14/2020   posterior neck, EDC   Breast cancer (HCC) 2008   left   Cancer (HCC)    melanoma   Complication of anesthesia    slow to wake up after surgery   GERD (gastroesophageal reflux disease)    Hypertension    Melanoma (HCC) 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 Malignant melanoma, lentigo maligna type. Clark's level II, Breslow's 0.40mm   Personal history of radiation therapy    Left breast   Personal history of tobacco use, presenting hazards to health 05/31/2015   Tobacco abuse 01/27/2015   Tobacco abuse 01/27/2015   Past Surgical History:  Past Surgical History:  Procedure Laterality Date   BREAST EXCISIONAL BIOPSY Left 2008   + radation   BREAST EXCISIONAL BIOPSY Right 2001 2010   neg surgical bx's   BREAST LUMPECTOMY Left 2008   w/radiation   CATARACT EXTRACTION W/PHACO Right 04/12/2016   Procedure: Cataract extraction phaco and intraocular lens placement right eye;  Surgeon: Lockie Mola, MD;  Location: [facility];  Service: Ophthalmology;  Laterality: Right;   CATARACT EXTRACTION W/PHACO Left 01/24/2017   Procedure: CATARACT EXTRACTION PHACO AND INTRAOCULAR LENS PLACEMENT (IOC) Complicated left;  Surgeon: Lockie Mola, MD;  Location: [facility];  Service: Ophthalmology;  Laterality: Left;  Complicated [place]ugin   CHOLECYSTECTOMY     D&C and hysteroscopy  1996   EMBOLIZATION N/A 01/29/2023   Procedure: EMBOLIZATION;  Surgeon: Annice Needy, MD;  Location: [facility] INVASIVE [facility];  Service: Cardiovascular;  Laterality: N/A;   LEEP  1996   TUBAL LIGATION     HPI:  Per  H&P: 82 y.o. 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 She presented to [facility] ER on 05/7 with fevers, tachycardia, and hypoxia. At baseline pt wears 2L O2 via nasal canula. She has been followed by oncology and received her first dose of keytruda on 01/22/23. She was recently taken off her antihypertensives due to hypotension and s/p dex taper. Pt currently on Dys 2 and thin liquids. Pt on baseline 2L O2. CT Chest 01/31/23: IMPRESSION: Interval progression of dense airspace consolidation within the right upper lobe. New dense airspace consolidation within the left upper lobe compared with 01/23/2023. Imaging findings are concerning for worsening multi focal pneumonia. Interval cavitation of previously noted lung nodules which in the setting of septicemia may reflect areas of septic emboli. Metastatic disease is considered less favored but would be difficult to exclude with a high degree of certainty. Follow-up imaging recommended to ensure resolution. Similar appearance of enlarged upper abdominal lymph nodes Aortic Atherosclerosis (ICD10-I70.0). Coronary artery calcifications. CXR 01/31/23: Mild worsening of bilateral upper lobe consolidation consistent with pneumonia.    Assessment / Plan / Recommendation  Clinical Impression  Pt seen for bedside swallow assessment in the setting of current PNA with medical hx relevant for stage 4 lung cancer with mets to the bone s/p neck radiation due to cervical mets (dx November 21, 2022) to the left neck. Pt sitting upright in bed with family (cousin) and friend present  for duration of evaluation. Pt with fatigue requiring verbal cues for maintained alertness t/o. Dry cough noted in the absence of PO intake, with family endorsing productive cough in the last few days. Pt/family denied subjective report of change to swallow function and voice in the setting of completion of recent neck  radiation.       Pt seen with trials of regular solids, thin liquids, and pureed solids. No overt or subtle s/sx pharyngeal dysphagia noted. No change to vocal quality across trials. Oral phases mildly prolonged for mastication and clearance of regular solids, with pt benefiting from liquid wash to facilitate oral clearance. Increased efficiency for oral manipulation noted with softer solids.       Based on current pulmonary status, debility, and hx of neck radiation, pt is at increased risk for aspiration. Education shared to pt/family regarding risk factors and aspiration precautions. Recommend implementation of slow rate, small bites, elevates HOB, alert for intake, monitor for endurance for PO intake, and upright for PO intake. Advance to Dys 2 with minced meat/extra sauces/gravies. SLP to follow up with skilled ST intervention. MD aware of recommendations for diet advancement.    SLP Visit Diagnosis: Dysphagia, unspecified (R13.10)    Aspiration Risk  Moderate aspiration risk    Diet Recommendation   Dys 3 (mech soft) Thin Liquids   Medication Administration: Whole meds with liquid    Other  Recommendations Oral Care Recommendations: Oral care BID    Recommendations for follow up therapy are one component of a multi-disciplinary discharge planning process, led by the attending physician.  Recommendations may be updated based on patient status, additional functional criteria and insurance authorization.  Follow up Recommendations Outpatient SLP         Functional Status Assessment Patient has had a recent decline in their functional status and demonstrates the ability to make significant improvements in function in a reasonable and predictable amount of time.  Frequency and Duration min 2x/week  2 weeks       Prognosis Prognosis for improved oropharyngeal function: Fair Barriers to Reach Goals:  (medical history)      Swallow Study   General Date of Onset: 02/01/23 HPI: Per  H&P: 82 y.o. 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 She presented to [facility] ER on 05/7 with fevers, tachycardia, and hypoxia. At baseline pt wears 2L O2 via nasal canula. She has been followed by oncology and received her first dose of keytruda on 01/22/23. She was recently taken off her antihypertensives due to hypotension and s/p dex taper. Pt currently on Dys 2 and thin liquids. Pt on baseline 2L O2. CT Chest 01/31/23: IMPRESSION: Interval progression of dense airspace consolidation within the right upper lobe. New dense airspace consolidation within the left upper lobe compared with 01/23/2023. Imaging findings are concerning for worsening multi focal pneumonia. Interval cavitation of previously noted lung nodules which in the setting of septicemia may reflect areas of septic emboli. Metastatic disease is considered less favored but would be difficult to exclude with a high degree of certainty. Follow-up imaging recommended to ensure resolution. Similar appearance of enlarged upper abdominal lymph nodes Aortic Atherosclerosis (ICD10-I70.0). Coronary artery calcifications. CXR 01/31/23: Mild worsening of bilateral upper lobe consolidation consistent with pneumonia. Type of Study: Bedside Swallow Evaluation Previous Swallow Assessment: none in chart Diet Prior to this Study: Dysphagia 2 (finely chopped);Thin liquids (Level 0) Temperature Spikes Noted:  No (Temp 98.4 (WBC 8.0)) Respiratory Status: Nasal cannula (baseline 2L nasal canula) History of Recent Intubation: No Behavior/Cognition: Cooperative;Lethargic/Drowsy Oral Cavity Assessment: Within Functional Limits Oral Care Completed by SLP: Recent completion by staff Vision: Functional for self-feeding Self-Feeding Abilities: Needs assist (weakness- needed assistance for managing cup) Patient Positioning: Upright in bed Baseline Vocal Quality: Normal Volitional  Cough: Strong Volitional Swallow: Able to elicit    Oral/Motor/Sensory Function Overall Oral Motor/Sensory Function: Within functional limits   Ice Chips Ice chips: Not tested   Thin Liquid Thin Liquid: Within functional limits    Nectar Thick Nectar Thick Liquid: Not tested   Honey Thick Honey Thick Liquid: Not tested   Puree Puree: Within functional limits   Solid     Solid: Impaired Presentation: Self Fed Oral Phase Impairments: Impaired mastication Oral Phase Functional Implications: Impaired mastication;Prolonged oral transit Pharyngeal Phase Impairments:  (none)     [place] Trishelle Devora Clapp  MS [facility] SLP   [place] J Clapp 02/01/2023,2:11 PM

## 2023-02-01 NOTE — Progress Notes (Addendum)
Progress Note   Patient: Crystal Mcmahon ZOX:[id number] DOB: 10-21-1940 DOA: 01/23/2023     9 DOS: the patient was seen and examined on 02/01/2023   Brief hospital course: 82 yo female with stage 4 lung cancer with mets to the bone s/p neck radiation due to cervical mets (dx November 21, 2022) to the left neck.  She presented to [facility] ER on 05/7 with fevers, tachycardia, and hypoxia.  At baseline pt wears 2L O2 via nasal canula.  She has been followed by oncology and received her first dose of keytruda on 01/22/23.  She was recently taken off her antihypertensives due to hypotension and s/p dex taper.     Upon arrival to the ER pts O2 sats were 84% on baseline O2@2L  via nasal canula requiring increase in O2 to 4L with improvement in oxygenation.  CXR concerning for pneumonia with trace pleural effusions and unchanged left apical mass.  Sepsis protocol initiated and pt received 1,750 ml iv fluid bolus, metronidazole, vancomycin, and cefepime.  Pt remained hypotensive requiring low dose levophed gtt.  PCCM team contacted for ICU admission.  Pt was off pressor and transferred to hospitalist service on 01/24/23. Patient condition does not seem to be improving, CT chest performed on 5/15 showed bilateral upper lobe worsening pneumonia with cavitary lesion, consistent with aspiration pneumonia.  Zosyn was started.   Principal Problem:   Septic shock (HCC) Active Problems:   Stage 4 lung cancer (HCC)   Pneumonia of right upper lobe due to infectious organism   Palliative care encounter   Pressure injury of skin   Malnutrition of moderate degree   Gastrointestinal hemorrhage   Hypernatremia   Hypophosphatemia   Thrombocytopenia (HCC)   Duodenal ulcer with hemorrhage   Acute on chronic respiratory failure with hypoxia (HCC)   Aspiration pneumonia (HCC)   Assessment and Plan: #Septic and hypovolemic shock  #Mildly elevated troponin likely secondary to demand ischemia in the setting of sepsis Bilateral  upper lobe pneumonia, likely aspiration pneumonia. Patient condition appears to be improving, no longer has any hypotension.  She has completed course of antibiotics, still on steroid taper per recommendation from pulmonology. Repeated chest images static with chest CT scan on 5/15 showed bilateral upper lobe pneumonia, with some cavitary lesion.  Discussed with pulmonology, this most likely aspiration pneumonia with Pseudomonas risk.  Started on Zosyn, will change to oral antibiotics with Pseudomonas coverage at discharge for total course of 21 days.  Echocardiogram will be repeated to make sure patient does not have valvular involvement. Sputum culture will be also obtained after nebulization treatment.   # AKI secondary to septic shock.  Ruled in. Hypernatremia. Hypokalemia. Hypophosphatemia. Patient initial creatinine was 1.44, baseline 0.72, patient met criteria for acute kidney injury. Patient renal function has recovered.  Will give additional potassium phosphate.  Patient also receiving another dose of metolazone as sodium level is still 147.     # acute blood loss anemia #Iron deficiency anemia  Thrombocytopenia.  # Upper GI bleed 2/2 duodenal ulcer # gastritis --coffee ground emesis and large melanotic stool on 5/9 --EGD 5/9 found large ulcer just past the pylorus in the duodenum. Another ulcer in the pylorus. The culprit lesion was the duodenal ulcer which was treated.  --found re-bleeding morning of 5/13, vascular performed coil embolization of the pancreaticcoduodenal artery and gastroduodenal artery. Patient still on PPI IV twice a day, hemoglobin seem to be better.   #Acute on chronic hypoxic respiratory failure  Chronic O2 @2L  O2 and  Current Everyday Smoker --2/2 PNA in the setting of lung cancer --increased O2 requirement on 5/11 to 7L.  Did get IVF in the past days.  O2 down to 4L next day after diuresis.  Though should consider pembro induced lung toxicity at this point,  per pulm. Oxygenation much improved after antibiotics changes, currently on 2 L oxygen.   #Right pleural effusion 2/2 parapneumonic effusion --thoracentesis on 5/8, with 330 ml removed Lab test from the thoracentesis showed exudates, but the majority of the white cells are lymphocytes, not infectious.  Cytology did not see any malignant cells.   #Stage IV lung cancer with mets to the bone Chest CT scan did not show worsening lung cancer.   Non-severe (moderate) malnutrition in context of chronic illness  --supplements per dietician    1517, Patient had another black stool, will monitor Hb. Still on protonix iv bid. Will notify GI.     Subjective:  Patient doing better today, more awake.  Short of breath better.  Oxygenation is better.  Physical Exam: Vitals:   01/31/23 2025 02/01/23 0608 02/01/23 0630 02/01/23 0739  BP:  134/68  113/78  Pulse:  83  72  Resp:  20  18  Temp:  97.9 F (36.6 C)  97.6 F (36.4 C)  TempSrc:  Oral    SpO2: 93% 91%  92%  Weight:   57.4 kg   Height:       General exam: Appears calm and comfortable  Respiratory system: Clear to auscultation. Respiratory effort normal. Cardiovascular system: S1 & S2 heard, RRR. No JVD, murmurs, rubs, gallops or clicks. No pedal edema. Gastrointestinal system: Abdomen is nondistended, soft and nontender. No organomegaly or masses felt. Normal bowel sounds heard. Central nervous system: Alert and oriented x2. No focal neurological deficits. Extremities: Symmetric 5 x 5 power. Skin: No rashes, lesions or ulcers Psychiatry:  Mood & affect appropriate.    Data Reviewed:  CT scan results reviewed, lab results reviewed.  Family Communication: Son updated at bedside.  Disposition: Status is: Inpatient Remains inpatient appropriate because: Severity of disease, IV treatment.     Time spent: 55 minutes  Author: Marrion Coy, MD 02/01/2023 1:41 PM  For on call review [email or web address].

## 2023-02-01 NOTE — Progress Notes (Signed)
OT Cancellation Note  Patient Details Name: Crystal Mcmahon MRN: [id number] DOB: August 07, 1941   Cancelled Treatment:    Reason Eval/Treat Not Completed: Fatigue/lethargy limiting ability to participate. Pt appearing very sleepy. Able to answer OT request for mobility to EOB, stating "no" and "I'll do it this afternoon". OT will return later as able. Supportive cousin at bedside encouraging pt to participate.   Alvester Morin 02/01/2023, 10:32 AM

## 2023-02-01 NOTE — Consult Note (Signed)
[facility] CONSULT NOTE  Patient Care Team: [name], MD as PCP - General (Family Medicine) Glory Buff, RN as Oncology Nurse Navigator  CHIEF COMPLAINTS/PURPOSE OF CONSULTATION: Lung cancer  HISTORY OF PRESENTING ILLNESS:  Lissete Rooker 82 y.o.  female with multiple medical problems-including metastatic lung cancer s/p recent radiation is currently admitted to the hospital for bilateral pneumonia/septic shock.   Patient initially on admission needed IV pressors/oxygen support and also antibiotics and ICU stay.  Patient states subsequently developed GI bleed s/p EGD noted to have a duodenal ulcer status post hemostatic spray.  Patient is currently transferred to the regular floor.  Patient continues to need oxygen; noted to have coughing/sputum production without hemoptysis.  Recent chest x-ray-showed bilateral pneumonia; followed by CT scan-that showed bilateral pneumonia septic emboli and also left upper lobe lung mass.    With regards to diagnosis of lung cancer patient received first cycle of immunotherapy/Keytruda approximately 1 week ago.  Oncology has been consulted for further recommendations.  Review of Systems  Unable to perform ROS: Other    MEDICAL HISTORY:  Past Medical History:  Diagnosis Date   Actinic keratosis    Basal cell carcinoma 06/14/2020   posterior neck, EDC   Breast cancer (HCC) 2008   left   Cancer (HCC)    melanoma   Complication of anesthesia    slow to wake up after surgery   GERD (gastroesophageal reflux disease)    Hypertension    Melanoma (HCC) 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 Malignant melanoma, lentigo maligna type. Clark's level II, Breslow's 0.34mm   Personal history of radiation therapy    Left breast   Personal history of tobacco use, presenting hazards to health 05/31/2015   Tobacco abuse 01/27/2015   Tobacco abuse 01/27/2015    SURGICAL HISTORY: Past Surgical History:  Procedure Laterality Date    BREAST EXCISIONAL BIOPSY Left 2008   + radation   BREAST EXCISIONAL BIOPSY Right 2001 2010   neg surgical bx's   BREAST LUMPECTOMY Left 2008   w/radiation   CATARACT EXTRACTION W/PHACO Right 04/12/2016   Procedure: Cataract extraction phaco and intraocular lens placement right eye;  Surgeon: Lockie Mola, MD;  Location: [facility];  Service: Ophthalmology;  Laterality: Right;   CATARACT EXTRACTION W/PHACO Left 01/24/2017   Procedure: CATARACT EXTRACTION PHACO AND INTRAOCULAR LENS PLACEMENT (IOC) Complicated left;  Surgeon: Lockie Mola, MD;  Location: [facility];  Service: Ophthalmology;  Laterality: Left;  Complicated Malyugin   CHOLECYSTECTOMY     D&C and hysteroscopy  1996   EMBOLIZATION N/A 01/29/2023   Procedure: EMBOLIZATION;  Surgeon: Annice Needy, MD;  Location: [facility] INVASIVE CV LAB;  Service: Cardiovascular;  Laterality: N/A;   LEEP  1996   TUBAL LIGATION      SOCIAL HISTORY: Social History   Socioeconomic History   Marital status: Married    Spouse name: Not on file   Number of children: Not on file   Years of education: Not on file   Highest education level: Not on file  Occupational History   Not on file  Tobacco Use   Smoking status: Every Day    Packs/day: 0.25    Years: 32.00    Additional pack years: 0.00    Total pack years: 8.00    Types: Cigarettes   Smokeless tobacco: Never  Substance and Sexual Activity   Alcohol use: No    Alcohol/week: 0.0 standard drinks of alcohol    Comment: occassional  Drug use: No   Sexual activity: Not Currently  Other Topics Concern   Not on file  Social History Narrative   Lives by self; husband in assisted living; Smoker since 54s [college]; rare alcohol. 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 2 children- [place]/ and [place].    Social Determinants of Health   Financial Resource Strain: Low Risk  (01/12/2023)   Overall Financial Resource Strain (CARDIA)    Difficulty of Paying Living  Expenses: Not hard at all  Food Insecurity: No Food Insecurity (01/23/2023)   Hunger Vital Sign    Worried About Running Out of Food in the Last Year: Never true    Ran Out of Food in the Last Year: Never true  Transportation Needs: No Transportation Needs (01/23/2023)   PRAPARE - Administrator, Civil Service (Medical): No    Lack of Transportation (Non-Medical): No  Recent Concern: Transportation Needs - Unmet Transportation Needs (01/16/2023)   PRAPARE - Transportation    Lack of Transportation (Medical): Yes    Lack of Transportation (Non-Medical): Yes  Physical Activity: Inactive (01/12/2023)   Exercise Vital Sign    Days of Exercise per Week: 0 days    Minutes of Exercise per Session: 0 min  Stress: No Stress Concern Present (01/12/2023)   [employer] of Occupational Health - Occupational Stress Questionnaire    Feeling of Stress : Only a little  Social Connections: Moderately Integrated (01/12/2023)   Social Connection and Isolation Panel [NHANES]    Frequency of Communication with Friends and Family: Twice a week    Frequency of Social Gatherings with Friends and Family: Once a week    Attends Religious Services: 1 to 4 times per year    Active Member of [employer] or Organizations: No    Attends Banker Meetings: Never    Marital Status: Married  Recent Concern: Social Connections - Moderately Isolated (01/12/2023)   Social Connection and Isolation Panel [NHANES]    Frequency of Communication with Friends and Family: Once a week    Frequency of Social Gatherings with Friends and Family: Once a week    Attends Religious Services: 1 to 4 times per year    Active Member of [employer] or Organizations: No    Attends Banker Meetings: Never    Marital Status: Married  Catering manager Violence: Not At Risk (01/23/2023)   Humiliation, Afraid, Rape, and Kick questionnaire    Fear of Current or Ex-Partner: No    Emotionally Abused: No    Physically  Abused: No    Sexually Abused: No    FAMILY HISTORY: Family History  Problem Relation Age of Onset   Pancreatic cancer Father    Breast cancer Neg Hx     ALLERGIES:  is allergic to acetaminophen-pamabrom, codeine, and midol pm [diphenhydramine-apap (sleep)].  MEDICATIONS:  Current Facility-Administered Medications  Medication Dose Route Frequency Provider Last Rate Last Admin   0.9 %  sodium chloride infusion (Manually program via Guardrails IV Fluids)   Intravenous Once Annice Needy, MD       0.9 %  sodium chloride infusion  250 mL Intravenous Continuous Annice Needy, MD   Stopped at 01/28/23 0906   albuterol (PROVENTIL) (2.5 MG/3ML) 0.083% nebulizer solution 2.5 mg  2.5 mg Nebulization Q6H PRN Salena Saner, MD       arformoterol [facility]) nebulizer solution 15 mcg  15 mcg Nebulization BID Salena Saner, MD   15 mcg at 02/01/23 1959  ascorbic acid (VITAMIN C) tablet 500 mg  500 mg Oral BID Annice Needy, MD   500 mg at 02/01/23 1610   Chlorhexidine Gluconate Cloth 2 % PADS 6 each  6 each Topical QHS Annice Needy, MD   6 each at 01/30/23 1200   citalopram (CELEXA) tablet 10 mg  10 mg Oral Daily Annice Needy, MD   10 mg at 02/01/23 0854   docusate sodium (COLACE) capsule 100 mg  100 mg Oral BID PRN Annice Needy, MD       feeding supplement (ENSURE ENLIVE / ENSURE PLUS) liquid 237 mL  237 mL Oral TID BM Darlin Priestly, MD   237 mL at 02/01/23 1356   fentaNYL (SUBLIMAZE) injection 12.5 mcg  12.5 mcg Intravenous Once PRN Annice Needy, MD       gabapentin (NEURONTIN) capsule 300 mg  300 mg Oral TID Annice Needy, MD   300 mg at 02/01/23 1712   HYDROmorphone (DILAUDID) injection 1 mg  1 mg Intravenous Once PRN Annice Needy, MD       morphine (MSIR) tablet 7.5 mg  7.5 mg Oral Q8H PRN Annice Needy, MD   7.5 mg at 01/27/23 0116   multivitamin with minerals tablet 1 tablet  1 tablet Oral Daily Annice Needy, MD   1 tablet at 02/01/23 0854   nystatin (MYCOSTATIN) 100000 UNIT/ML suspension  500,000 Units  5 mL Oral QID Foust, Katy L, NP   500,000 Units at 02/01/23 1712   ondansetron (ZOFRAN) injection 4 mg  4 mg Intravenous Q6H PRN Annice Needy, MD   4 mg at 01/30/23 0940   ondansetron (ZOFRAN) injection 4 mg  4 mg Intravenous Q6H PRN Annice Needy, MD       pantoprazole (PROTONIX) injection 40 mg  40 mg Intravenous Q12H Annice Needy, MD   40 mg at 02/01/23 2031   piperacillin-tazobactam (ZOSYN) IVPB 3.375 g  3.375 g Intravenous Tobe Sos, MD 12.5 mL/hr at 02/01/23 2035 Infusion Verify at 02/01/23 2035   polyethylene glycol (MIRALAX / GLYCOLAX) packet 17 g  17 g Oral Daily PRN Annice Needy, MD       revefenacin (YUPELRI) nebulizer solution 175 mcg  175 mcg Nebulization Daily Salena Saner, MD       sodium chloride 0.9 % bolus 1,000 mL  1,000 mL Intravenous Once Andris Baumann, MD        PHYSICAL EXAMINATION:   Vitals:   02/01/23 1639 02/01/23 2041  BP: 121/68 (!) 116/59  Pulse: 77 86  Resp: 18 16  Temp: 98.3 F (36.8 C) 98.6 F (37 C)  SpO2: 95% 93%   Filed Weights   01/29/23 0435 01/30/23 0500 02/01/23 0630  Weight: 128 lb 8.5 oz (58.3 kg) 124 lb 1.9 oz (56.3 kg) 126 lb 8.7 oz (57.4 kg)   Frail-appearing Caucasian female patient.  She is on 3 L of oxygen.  Accompanied by her son and sister.  Physical Exam Vitals and nursing note reviewed.  HENT:     Head: Normocephalic and atraumatic.     Mouth/Throat:     Pharynx: Oropharynx is clear.  Eyes:     Extraocular Movements: Extraocular movements intact.     Pupils: Pupils are equal, round, and reactive to light.  Cardiovascular:     Rate and Rhythm: Normal rate and regular rhythm.  Pulmonary:     Comments: Decreased breath sounds bilaterally.  Abdominal:  Palpations: Abdomen is soft.  Musculoskeletal:        General: Normal range of motion.     Cervical back: Normal range of motion.  Skin:    General: Skin is warm.  Neurological:     General: No focal deficit present.     Mental Status: She  is alert and oriented to person, place, and time.  Psychiatric:        Behavior: Behavior normal.        Judgment: Judgment normal.     LABORATORY DATA:  I have reviewed the data as listed Lab Results  Component Value Date   WBC 8.0 02/01/2023   HGB 10.7 (L) 02/01/2023   HCT 32.4 (L) 02/01/2023   MCV 90.0 02/01/2023   PLT 103 (L) 02/01/2023   Recent Labs    01/24/23 0855 01/25/23 0552 01/26/23 0500 01/30/23 0442 01/31/23 0237 02/01/23 0657  NA  --  142   < > 150* 148* 147*  K  --  2.8*   < > 3.3* 3.6 3.4*  CL  --  110   < > 121* 119* 114*  CO2  --  19*   < > 22 23 23   GLUCOSE  --  160*   < > 113* 170* 85  BUN  --  33*   < > 39* 36* 34*  CREATININE  --  0.84   < > 0.78 0.84 0.92  CALCIUM  --  7.4*   < > 6.6* 6.1* 6.4*  GFRNONAA  --  >60   < > >60 >60 >60  PROT 5.2* 4.8*  --   --  4.2*  --   ALBUMIN 2.0* 1.8*  --   --  1.9*  --   AST 41 46*  --   --  38  --   ALT 21 19  --   --  12  --   ALKPHOS 50 49  --   --  37*  --   BILITOT 1.2 0.9  --   --  0.8  --   BILIDIR 0.2  --   --   --  0.2  --   IBILI 1.0*  --   --   --  0.6  --    < > = values in this interval not displayed.    RADIOGRAPHIC STUDIES: I have personally reviewed the radiological images as listed and agreed with the findings in the report. DG Chest Port 1 View  Result Date: 01/31/2023 CLINICAL DATA:  Pneumonia. EXAM: PORTABLE CHEST 1 VIEW COMPARISON:  Chest radiograph 01/27/2023 FINDINGS: The cardiomediastinal silhouette is unchanged. Dense airspace consolidation in the right upper lobe is stable to mildly increased compared to the prior study. Less extensive consolidation in the left upper lobe has mildly progressed. There are hazy densities in the right greater than left lung bases with pleural effusions demonstrated on today's subsequent CT. Nodular lung densities are again noted and more completely characterized on the subsequent CT. No pneumothorax is identified. Embolization coils are noted in the upper  abdomen. IMPRESSION: Mild worsening of bilateral upper lobe consolidation consistent with pneumonia. Electronically Signed   By: Sebastian Ache M.D.   On: 01/31/2023 13:55   CT CHEST WO CONTRAST  Result Date: 01/31/2023 CLINICAL DATA:  History of stage IV lung cancer. Patient presents with fever, tachycardia and hypoxia. * Tracking Code: BO *. EXAM: CT CHEST WITHOUT CONTRAST TECHNIQUE: Multidetector CT imaging of the chest was performed following the standard protocol without  IV contrast. RADIATION DOSE REDUCTION: This exam was performed according to the departmental dose-optimization program which includes automated exposure control, adjustment of the mA and/or kV according to patient size and/or use of iterative reconstruction technique. COMPARISON:  01/23/2023 FINDINGS: Cardiovascular: Heart size is normal. Aortic atherosclerosis. Coronary artery calcification. No pericardial effusion. Mediastinum/Nodes: Right lobe of thyroid gland is not seen. Left lobe appears normal. Trachea appears patent and midline. Unremarkable appearance of the esophagus. The index left supraclavicular lymph node measures 8 mm, image 22/2. Stable from previous exam. Index left pre vascular lymph node measures 0.9 cm, image 34/2. Previously 1.2 cm. Index AP window lymph node measures 2.1 x 1.6 cm, image 51/2. Unchanged from previous exam. Lungs/Pleura: Moderate right pleural effusion and small left pleural effusion identified. Progressive scratch again seen is extensive airspace consolidation within the right upper lobe which appears progressive when compared with the exam from 01/23/2023. Interval development of extensive airspace disease within the left upper lobe and left apex, similar to the right upper lobe. Multiple pulmonary nodules are again seen. The reference nodule within the left lower lobe now appears cavitary measuring 1.9 x 1.2 cm, image 63/3. Previously 1.7 x 1.3 cm. Previous reference nodule in the superior segment of  the right lower lobe has undergone central cavitation and decompression. This now has a flattened, discoid configuration measuring 0.6 x 0.3 cm. Previously 0.8 by 0.6 cm. Previous nodule in the superior segment of the left lower lobe now appears cavitary measuring 1.1 x 0.7 cm. Previously 1.8 x 1.3 cm. Upper Abdomen: Fat containing right posterior diaphragmatic hernia is again noted which contains fat only, image 110/2. Small hiatal hernia identified. 3.4 cm simple appearing cyst arises off the upper pole of right kidney. No follow-up imaging recommended.Status post cholecystectomy. Embolization coils identified within the upper abdomen. Musculoskeletal: Chronic anterolateral right rib fractures. No acute or suspicious osseous findings. IMPRESSION: 1. Interval progression of dense airspace consolidation within the right upper lobe. New dense airspace consolidation within the left upper lobe compared with 01/23/2023. Imaging findings are concerning for worsening multi focal pneumonia. 2. Interval cavitation of previously noted lung nodules which in the setting of septicemia may reflect areas of septic emboli. Metastatic disease is considered less favored but would be difficult to exclude with a high degree of certainty. Follow-up imaging recommended to ensure resolution. 3. Similar appearance of enlarged upper abdominal lymph nodes 4. Aortic Atherosclerosis (ICD10-I70.0). Coronary artery calcifications. Electronically Signed   By: Signa Kell M.D.   On: 01/31/2023 13:23   PERIPHERAL VASCULAR CATHETERIZATION  Result Date: 01/29/2023 See surgical note for result.  DG Chest Port 1 View  Result Date: 01/27/2023 CLINICAL DATA:  Hypoxia EXAM: PORTABLE CHEST 1 VIEW COMPARISON:  01/25/2023 a CT from 01/23/2023 FINDINGS: Cardiac shadow is stable. Lungs are well aerated bilaterally. Bilateral upper lobe infiltrates are seen right greater than left slightly increased when compared with the prior exam. Linear  atelectatic changes are noted along the left cardiac border inferiorly. Some nodularity is seen as well particularly in the left lung. These changes are similar to that seen on prior CT examination. IMPRESSION: Increasing upper lobe consolidation bilaterally. Nodularity similar to that seen on recent CT examination. Electronically Signed   By: Alcide Clever M.D.   On: 01/27/2023 17:25   ECHOCARDIOGRAM COMPLETE  Result Date: 01/26/2023    ECHOCARDIOGRAM REPORT   Patient Name:   SEELA SCHILZ Date of Exam: 01/26/2023 Medical Rec #:  [id number]       Height:  60.0 in Accession #:    [id number]      Weight:       127.6 lb Date of Birth:  Feb 04, 1941       BSA:          1.542 m Patient Age:    81 years        BP:           127/56 mmHg Patient Gender: F               HR:           94 bpm. Exam Location:  [facility] Procedure: 2D Echo, Cardiac Doppler and Color Doppler Indications:     Stroke I63.9  History:         Patient has no prior history of Echocardiogram examinations.                  Risk Factors:Hypertension. Tobacco abuse, breast cancer.  Sonographer:     Cristela Blue Referring Phys:  [id number] Earna Coder Diagnosing Phys: Marcina Millard MD  Sonographer Comments: Technically challenging study due to limited acoustic windows, no apical window and no subcostal window. IMPRESSIONS  1. Left ventricular ejection fraction, by estimation, is 55 to 60%. The left ventricle has normal function. The left ventricle has no regional wall motion abnormalities. Left ventricular diastolic parameters are indeterminate.  2. Right ventricular systolic function is normal. The right ventricular size is normal.  3. The mitral valve is normal in structure. Mild mitral valve regurgitation. No evidence of mitral stenosis.  4. The aortic valve is normal in structure. Aortic valve regurgitation is not visualized. No aortic stenosis is present.  5. The inferior vena cava is normal in size with greater than 50% respiratory  variability, suggesting right atrial pressure of 3 mmHg. FINDINGS  Left Ventricle: Left ventricular ejection fraction, by estimation, is 55 to 60%. The left ventricle has normal function. The left ventricle has no regional wall motion abnormalities. The left ventricular internal cavity size was normal in size. There is  no left ventricular hypertrophy. Left ventricular diastolic parameters are indeterminate. Right Ventricle: The right ventricular size is normal. No increase in right ventricular wall thickness. Right ventricular systolic function is normal. Left Atrium: Left atrial size was normal in size. Right Atrium: Right atrial size was normal in size. Pericardium: There is no evidence of pericardial effusion. Mitral Valve: The mitral valve is normal in structure. Mild mitral valve regurgitation. No evidence of mitral valve stenosis. Tricuspid Valve: The tricuspid valve is normal in structure. Tricuspid valve regurgitation is mild . No evidence of tricuspid stenosis. Aortic Valve: The aortic valve is normal in structure. Aortic valve regurgitation is not visualized. No aortic stenosis is present. Pulmonic Valve: The pulmonic valve was normal in structure. Pulmonic valve regurgitation is not visualized. No evidence of pulmonic stenosis. Aorta: The aortic root is normal in size and structure. Venous: The inferior vena cava is normal in size with greater than 50% respiratory variability, suggesting right atrial pressure of 3 mmHg. IAS/Shunts: No atrial level shunt detected by color flow Doppler.  LEFT VENTRICLE PLAX 2D LVIDd:         3.80 cm LVIDs:         2.70 cm LV PW:         1.20 cm LV IVS:        1.20 cm LVOT diam:     2.00 cm LVOT Area:     3.14 cm  LEFT ATRIUM  Index LA diam:    3.40 cm 2.20 cm/m   AORTA Ao Root diam: 2.60 cm  SHUNTS Systemic Diam: 2.00 cm Marcina Millard MD Electronically signed by Marcina Millard MD Signature Date/Time: 01/26/2023/1:03:43 PM    Final    DG Chest Port 1  View  Result Date: 01/25/2023 CLINICAL DATA:  Tachycardia EXAM: PORTABLE CHEST 1 VIEW COMPARISON:  Yesterday FINDINGS: Airspace disease at the upper lobes greater on the right. Haziness at the right base is from pleural fluid by CT. Normal heart size. Artifact from EKG leads. IMPRESSION: Biapical pneumonia that is worse on the right, stable from prior. Electronically Signed   By: Tiburcio Pea M.D.   On: 01/25/2023 05:03   US THORACENTESIS ASP PLEURAL SPACE W/IMG GUIDE  Result Date: 01/24/2023 INDICATION: Parapneumonic pleural effusion EXAM: ULTRASOUND GUIDED RIGHT THORACENTESIS MEDICATIONS: None. COMPLICATIONS: None immediate. PROCEDURE: An ultrasound guided thoracentesis was thoroughly discussed with the patient and questions answered. The benefits, risks, alternatives and complications were also discussed. The patient understands and wishes to proceed with the procedure. Written consent was obtained. Ultrasound was performed to localize and mark an adequate pocket of fluid in the right chest. The area was then prepped and draped in the normal sterile fashion. 1% Lidocaine was used for local anesthesia. Under ultrasound guidance a 19 gauge, 7-cm, Yueh catheter was introduced. Thoracentesis was performed. The catheter was removed and a dressing applied. FINDINGS: A total of approximately 330 ml of clear yellow fluid was removed. Samples were sent to the laboratory as requested by the clinical team. IMPRESSION: Successful ultrasound guided right thoracentesis yielding 330 mL of pleural fluid. Electronically Signed   By: Olive Bass M.D.   On: 01/24/2023 14:23   DG Chest Port 1 View  Result Date: 01/24/2023 CLINICAL DATA:  Pleural effusion post right thoracentesis. EXAM: PORTABLE CHEST 1 VIEW COMPARISON:  01/23/2023 FINDINGS: Heart size and pulmonary vascularity are normal. No significant pleural effusion or pneumothorax identified. Bilateral upper lobe airspace consolidation with volume loss. Changes may  represent pneumonia or postobstructive process. Left upper lung infiltrates are progressing since the previous study. Surgical clips in the right upper quadrant. IMPRESSION: 1. No significant pleural effusion or pneumothorax identified. 2. Bilateral upper lobe airspace consolidation, progressing on the left since prior study. Electronically Signed   By: Burman Nieves M.D.   On: 01/24/2023 12:05   CT Chest W Contrast  Result Date: 01/23/2023 CLINICAL DATA:  Sepsis; stage IV lung malignancy; * Tracking Code: BO * EXAM: CT CHEST WITH CONTRAST TECHNIQUE: Multidetector CT imaging of the chest was performed during intravenous contrast administration. RADIATION DOSE REDUCTION: This exam was performed according to the departmental dose-optimization program which includes automated exposure control, adjustment of the mA and/or kV according to patient size and/or use of iterative reconstruction technique. CONTRAST:  75mL OMNIPAQUE IOHEXOL 300 MG/ML  SOLN COMPARISON:  PET-CT dated Feb 03, 2023 FINDINGS: Cardiovascular: Normal heart size. No pericardial effusion. Normal caliber thoracic aorta with severe atherosclerotic disease. Mediastinum/Nodes: Small to moderate hiatal hernia. Enlarged mediastinal and left supraclavicular lymph nodes, unchanged when compared with the prior exam. Reference AP window lymph node measuring 2.1 x 1.8 cm on series 2, image 48, unchanged when compared with the prior exam. Lungs/Pleura: Central airways are patent. Similar left apical mass with associated pleural thickening. New large right upper lobe consolidation and left upper lobe ground-glass opacities. New multiple solid pulmonary nodules. Reference new solid pulmonary nodule of the left lower lobe measuring 1.7 x 1.3 cm. Similar nodular  ground-glass opacities of the left upper lobe. New small right-greater-than-left pleural effusions and bibasilar atelectasis. 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 Prior  cholecystectomy. Partially visualized simple appearing cyst of the right kidney. Musculoskeletal: Old anterolateral right-sided rib fractures. No aggressive appearing osseous lesions. IMPRESSION: 1. New large right upper lobe consolidation and left upper lobe ground-glass opacities, findings are likely due to pneumonia. 2. New small right-greater-than-left pleural effusions and bibasilar atelectasis. 3. Similar left apical mass with associated pleural thickening. 4. Multiple new large solid pulmonary nodules, concerning for progressive metastatic disease, although findings could also be infectious. Recommend short-term follow-up 1-2 months for further evaluation. 5. Stable mediastinal and left supraclavicular lymphadenopathy. 6. Aortic Atherosclerosis (ICD10-I70.0). Electronically Signed   By: Allegra Lai M.D.   On: 01/23/2023 16:03   DG Chest Portable 1 View  Result Date: 01/23/2023 CLINICAL DATA:  Fever EXAM: PORTABLE CHEST 1 VIEW COMPARISON:  Radiograph 01/13/2023 FINDINGS: Unchanged cardiomediastinal silhouette. There is confluent right upper lobe airspace disease and patchy airspace opacities in the mid to lower lungs. Trace pleural effusions. Unchanged left apical mass and pleural thickening. No evidence of pneumothorax. No acute osseous abnormality. IMPRESSION: Confluent right upper lobe airspace disease and patchy airspace opacities in the mid to lower lungs, concerning for multifocal pneumonia. Trace pleural effusions. Unchanged left apical mass and pleural thickening. Electronically Signed   By: Caprice Renshaw M.D.   On: 01/23/2023 13:20   DG Chest 2 View  Result Date: 01/13/2023 CLINICAL DATA:  Hypotensive, immunocompromise. 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 There are likely small bilateral pleural effusions. There is no focal lung infiltrate. The cardiomediastinal silhouette is within normal limits.  Bones are osteopenic. No acute fractures are seen. There surgical clips in the right abdomen. IMPRESSION: 1. Small bilateral pleural effusions. 2. 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 Electronically Signed   By: Darliss Cheney M.D.   On: 01/13/2023 18:29    No problem-specific Assessment & Plan notes found for this encounter.  # 82 year old female patient with multiple medical problems including COPD/history of smoking metastatic lung cancer-currently admitted to hospital for pneumonia/septic shock.  # Metastatic left upper lobe lung cancer-s/p cycle #1 of Keytruda 05/06.   # Bilateral pneumonia with septic emboli/sepsis-question complicated by aspiration  # Upper GI bleed/duodenal ulcer-radiation esophagitis-s/p EGD  # Pain secondary to underlying tumor involving left cervical region.  # CODE STATUS DNR/DNI:  # Recommendations/Plan:  # Agree with continued scope of treatment-with antibiotics and evaluation for risk of aspiration.  Also await pulmonary reevaluation.  # Had a long discussion with the patient's family [son and sister by the bedside]-regarding the difficult course of patient's illness and the challenges of overcoming pneumonia with the pre-existing COPD/immuno compromised status underlying lung cancer etc.  Discussed that we will continue current dose scope of care for the next few days.  However if patient does not improve/or unfortunately continued decline-hospice would be a reasonable option.  Again for now we will continue current scope of care.   Thank you Dr. Chipper Herb for allowing me to participate in the care of your pleasant patient. Please do not hesitate to contact me with questions or concerns in the interim. Discussed with Dr.Zhang.   Above plan of care was discussed with patient/family in detail.  My contact information was given to the patient/family.     Earna Coder, MD 02/01/2023 9:57 PM

## 2023-02-01 NOTE — Plan of Care (Signed)

## 2023-02-01 NOTE — Care Management Important Message (Signed)
Important Message  Patient Details  Name: Shernice Mizerak MRN: [id number] Date of Birth: Oct 27, 1940   Medicare Important Message Given:  Yes     Johnell Comings 02/01/2023, 1:01 PM

## 2023-02-01 NOTE — Progress Notes (Signed)
Progress Note    02/01/2023 10:06 AM 3 Days Post-Op  Subjective:  This is a 82 y.o. 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 She presented to [facility] ER on 05/7 with fevers, tachycardia, and hypoxia. On 01/25/2023 patient underwent an upper endoscopy. She was found to have a large ulcer just past the pylorus and the duodenum and another ulcer in the pylorus. The actual bleeding ulcer was found in the duodenal which was treated with epinephrine anticoagulation gel according to the patient's sister.   Patient is now POD #3 from Aortogram and selective angiogram of the celiac artery and gastroduodenal artery with coil embolization of the pancreaticcoduodenal artery and gastroduodenal artery. On exam patient rests comfortably in ICU. Right groin is with dressing clean dry and intact. No hematoma or seroma to note. No complaints overnight and vitals all remain stable.    Vitals:   02/01/23 0608 02/01/23 0739  BP: 134/68 113/78  Pulse: 83 72  Resp: 20 18  Temp: 97.9 F (36.6 C) 97.6 F (36.4 C)  SpO2: 91% 92%   Physical Exam: Cardiac:  RRR Normal S1, S2  Lungs:  normal non-labored breathing, without Rales, rhonchi, wheezing  Incisions:  Right groin with dressing clean dry and intact. No hematoma or seroma to note.  Extremities:  Bilateral lower extremities with palpable PT pulses and warm to touch.  Abdomen:  Positive bowel sounds throughout, soft, flat, non tender and non distended.  Neurologic: AAOX3, follows commands.   CBC    Component Value Date/Time   WBC 8.0 02/01/2023 0712   RBC 3.60 (L) 02/01/2023 0712   HGB 10.3 (L) 02/01/2023 0712   HGB 9.7 (L) 01/19/2023 1502   HGB 14.2 12/30/2014 1033   HCT 32.4 (L) 02/01/2023 0712   HCT 42.6 12/30/2014 1033   PLT 103 (L) 02/01/2023 0712   PLT 293 01/19/2023 1502   PLT 266 12/30/2014 1033   MCV 90.0 02/01/2023 0712   MCV 92 12/30/2014 1033   MCH  28.6 02/01/2023 0712   MCHC 31.8 02/01/2023 0712   RDW 18.7 (H) 02/01/2023 0712   RDW 13.3 12/30/2014 1033   LYMPHSABS 0.7 01/25/2023 0552   LYMPHSABS 1.8 12/30/2014 1033   MONOABS 0.2 01/25/2023 0552   MONOABS 0.5 12/30/2014 1033   EOSABS 0.0 01/25/2023 0552   EOSABS 0.0 12/30/2014 1033   BASOSABS 0.0 01/25/2023 0552   BASOSABS 0.1 12/30/2014 1033    BMET    Component Value Date/Time   NA 147 (H) 02/01/2023 0657   NA 136 12/30/2014 1033   K 3.4 (L) 02/01/2023 0657   K 3.0 (L) 12/30/2014 1033   CL 114 (H) 02/01/2023 0657   CL 100 (L) 12/30/2014 1033   CO2 23 02/01/2023 0657   CO2 30 12/30/2014 1033   GLUCOSE 85 02/01/2023 0657   GLUCOSE 117 (H) 12/30/2014 1033   BUN 34 (H) 02/01/2023 0657   BUN 21 (H) 12/30/2014 1033   CREATININE 0.92 02/01/2023 0657   CREATININE 1.14 (H) 01/19/2023 1502   CREATININE 0.99 12/30/2014 1033   CALCIUM 6.4 (LL) 02/01/2023 0657   CALCIUM 9.2 12/30/2014 1033   GFRNONAA >60 02/01/2023 0657   GFRNONAA 48 (L) 01/19/2023 1502   GFRNONAA 56 (L) 12/30/2014 1033   GFRAA >60 02/01/2017 0830   GFRAA >60 12/30/2014 1033    INR    Component Value Date/Time   INR 1.1 01/23/2023  1212     Intake/Output Summary (Last 24 hours) at 02/01/2023 1006 Last data filed at 02/01/2023 1610 Gross per 24 hour  Intake 394.48 ml  Output 500 ml  Net -105.52 ml     Assessment/Plan:  82 y.o. female is s/p Aortogram and selective angiogram of the celiac artery and gastroduodenal artery with coil embolization  3 Days Post-Op No further bloody bowel movements noted.   PLAN: Hemoglobin and Hematocrit  has stabilized.  Continue PT and OT for strengthening  Per vascular surgery okay to discharge to home.   DVT prophylaxis:  NONE   Ewart Carrera R [facility] 02/01/2023 10:06 AM

## 2023-02-01 NOTE — Progress Notes (Addendum)
NAME:  Crystal Mcmahon, MRN:  [id number], DOB:  Nov 15, 1940, LOS: 9 ADMISSION DATE:  01/23/2023, INITIAL CONSULTATION DATE: 01/23/2023 FOLLOW-UP REQUESTED: 02/01/2023 REFERRING MD: Dr. 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  She presented to [facility] ER on 05/7 with fevers, tachycardia, and hypoxia.  At baseline pt wears 2L O2 via nasal canula.  She has been followed by oncology and received her first dose of keytruda on 01/22/23.  She was recently taken off her antihypertensives due to hypotension and s/p dex taper.  She endorses poor po intake due to decreased appetite.    Oncology hx: Primary cancer of left upper lobe of lung  12/22/2022 Initial Diagnosis    Primary cancer of left upper lobe of lung    12/22/2022 Cancer Staging    Staging form: Lung, AJCC 8th Edition - Clinical: Stage IVB (cT2, cN2, cM1c) - Signed by Earna Coder, MD on 12/22/2022    01/19/2023 -  Chemotherapy    Patient is on Treatment Plan : LUNG NSCLC Pembrolizumab (200) q21d      ED Course Upon arrival to the ER pts O2 sats were 84% on baseline O2@2L  via nasal canula requiring increase in O2 to 4L with improvement in oxygenation.  ER vital signs were: temp 101.1 F/sbp 98/hr 118/resp rate 15/O2 sats 96% on 4L.  ER lab results: BUN 29/creatinine 1.10/calcium 8.6/albumin 1.9/AST 44/troponin 30/pct 0.83/hgb 8.8.  CXR concerning for pneumonia with trace pleural effusions and unchanged left apical mass.  Sepsis protocol initiated and pt received 1,750 ml iv fluid bolus, metronidazole, vancomycin, and cefepime.  Pt remained hypotensive requiring low dose levophed gtt.  PCCM team contacted for ICU admission.   Hospital course thus far: Patient was transferred to the hospitalist service for ongoing management of  her pneumonia and pleural effusions.  Her hospital course has been complicated by upper GI bleed necessitating EGD on 25 Jan 2023 showing gastric ulcers, esophagitis and a hiatal hernia.  She then had to undergo embolization by vascular surgery due to ongoing bleeding from a duodenal ulcer.  She underwent a CT yesterday, 15 May, for follow-up of her pneumonia.  This showed extensive airspace disease within the left upper lobe and left apex and same on the right upper lobe.  Multiple pulmonary nodules.  Some areas of cavitation.  She has required conscious sedation on a number of these procedures.   We are asked to assess the patient for potential need for bronchoscopy.  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 New small          right-greater-than-left pleural effusions and bibasilar atelectasis. Similar left apical mass        with associated  pleural thickening. Multiple new large solid pulmonary nodules,        concerning for progressive metastatic disease, although findings could also be infectious.        Recommend short-term follow-up 1-2 months for further evaluation. Stable mediastinal         and left supraclavicular lymphadenopathy. Aortic Atherosclerosis (ICD10-I70.0) 05/08: Right thoracentesis 05/09: EGD for upper GI bleed 05/13: Embolization due to persistent duodenal ulcer bleed performed by vascular  surgery 05/15: CT chest: Interval progression of dense airspace consolidation within the right upper lobe, new dense airspace consolidation within the left upper lobe, worsening multifocal pneumonia suspected.  Interval cavitation of previously noted lung nodules query septic emboli, enlarged upper abdominal lymph nodes  Micro Data:  COVID-19 05/07>>negative  Influenza A&B 05/07>>negative  RSV 05/07>>negative  Blood x2 05/07>>   Anti-infectives (From admission, onward)    Start     Dose/Rate Route Frequency Ordered Stop   02/01/23 2200  piperacillin-tazobactam (ZOSYN) IVPB 3.375 g        3.375 g 12.5 mL/hr over 240 Minutes Intravenous Every 8 hours 02/01/23 1503     01/31/23 2100  piperacillin-tazobactam (ZOSYN) IVPB 4.5 g  Status:  Discontinued        4.5 g 25 mL/hr over 240 Minutes Intravenous Every 8 hours 01/31/23 1658 02/01/23 1503   01/29/23 1251  ceFAZolin (ANCEF) IVPB 2g/100 mL premix        2 g 200 mL/hr over 30 Minutes Intravenous 30 min pre-op 01/29/23 1252 01/29/23 1405   01/28/23 1200  azithromycin (ZITHROMAX) 500 mg in sodium chloride 0.9 % 250 mL IVPB        500 mg 250 mL/hr over 60 Minutes Intravenous Every 24 hours 01/28/23 0904 01/30/23 1300   01/25/23 1300  vancomycin (VANCOREADY) IVPB 1250 mg/250 mL  Status:  Discontinued        1,250 mg 166.7 mL/hr over 90 Minutes Intravenous Every 48 hours 01/24/23 0115 01/24/23 1343   01/25/23 1230  erythromycin 250 mg in sodium chloride 0.9 % 100 mL IVPB        250 mg 100 mL/hr over 60 Minutes Intravenous NOW 01/25/23 1136 01/25/23 1831   01/24/23 1600  vancomycin (VANCOREADY) IVPB 750 mg/150 mL  Status:  Discontinued        750 mg 150 mL/hr over 60 Minutes Intravenous Every 24 hours 01/24/23 1343 01/26/23 1519   01/24/23 0145  vancomycin (VANCOREADY) IVPB 1250 mg/250 mL  Status:  Discontinued        1,250 mg 166.7 mL/hr over 90 Minutes Intravenous Every 24 hours 01/24/23 0045 01/24/23 0115   01/24/23 0100  ceFEPIme  (MAXIPIME) 2 g in sodium chloride 0.9 % 100 mL IVPB  Status:  Discontinued        2 g 200 mL/hr over 30 Minutes Intravenous Every 12 hours 01/24/23 0045 01/30/23 1915   01/23/23 1230  ceFEPIme (MAXIPIME) 2 g in sodium chloride 0.9 % 100 mL IVPB        2 g 200 mL/hr over 30 Minutes Intravenous  Once 01/23/23 1220 01/23/23 1305   01/23/23 1230  metroNIDAZOLE (FLAGYL) IVPB 500 mg        500 mg 100 mL/hr over 60 Minutes Intravenous  Once 01/23/23 1220 01/23/23 1422   01/23/23 1230  vancomycin (VANCOCIN) IVPB 1000 mg/200 mL premix  Status:  Discontinued        1,000 mg 200 mL/hr over 60 Minutes Intravenous  Once 01/23/23 1220 01/23/23 1223   01/23/23 1230  vancomycin (VANCOREADY) IVPB 1250 mg/250 mL        1,250 mg 166.7 mL/hr over 90 Minutes Intravenous  Once 01/23/23 1223 01/23/23 1650      Interim History / Subjective:  Patient was started on Zosyn yesterday after discussion with hospitalist.  Patient noted to be be more animated today according to family who is at bedside.  The patient appears occasionally lethargic but does respond and states she feels better.  She does have a congested cough.  Objective   Blood pressure 121/68, pulse 77, temperature 98.3 F (36.8 C), temperature source Oral, resp. rate 18, height 5' (1.524 m), weight 57.4 kg, SpO2 95 %.    SpO2: 95 % O2 Flow Rate (L/min): 1.5 L/min FiO2 (%): 32 %   Intake/Output Summary (Last 24 hours) at 02/01/2023 1645 Last data filed at 02/01/2023 1021 Gross per 24 hour  Intake 421.39 ml  Output 200 ml  Net 221.39 ml    Filed Weights   01/29/23 0435 01/30/23 0500 02/01/23 0630  Weight: 58.3 kg 56.3 kg 57.4 kg   Examination: General: Acute on chronically-ill female resting in bed, NAD on 1.5 L O2 via nasal canula  HENT: Supple, no JVD  Lungs: Faint rhonchi throughout, even, non labored  Cardiovascular: NSR, s1s2, rrr, no m/r/g, 2+ radial/2+ distal pulses, no edema  Abdomen: +BS x4, obese, soft, non tender   Extremities: Significant sarcopenia, moves all extremities, significant debility/frailty Neuro: Lethargic but oriented, following commands, PERRLA  GU: Has external urinary catheter in place  Imaging Reviewed  Representative images from CT performed 31 Jan 2023, independently reviewed:        Assessment & Plan:  Multifocal necrotizing pneumonia Query aspiration Zosyn resumed, clinically improved per family Pulmonary hygiene Sputum for culture Currently no need for bronchoscopy no central obstructing lesion, multifocal process Patient would not tolerate bronchoscopy well Discontinue steroids, no clear-cut indication and potential increased morbidity  Acute on chronic hypoxic respiratory failure  Pneumonia  Pleural effusions  Stage IV lung cancer with mets to the bone Hx: Chronic O2 @2L  O2 and Current Everyday Smoker  - Supplemental O2 for dyspnea and/or hypoxia  - Scheduled and prn bronchodilator therapy (Brovana/Yupelri/albuterol) - O2 requirements have decreased - Need to consider Palliative Care consultation  Protein calorie malnutrition  Cancer cachexia - Continue regular diet  - Dietitian consulted appreciate input   Debility/frailty PT OT as tolerated This issue adds complexity to her management   Best Practice (right click and "Reselect all SmartList Selections" daily)   Diet/type: Regular consistency (see orders) DVT prophylaxis: LMWH GI prophylaxis: Protonix Lines: N/A Foley:  N/A, has external urinary catheter Code Status:  DNR Last date of multidisciplinary goals of care discussion [N/A]   Labs   CBC: Recent Labs  Lab 01/28/23 0348 01/29/23 0429 01/29/23 1728 01/30/23 0442 01/31/23 0237 02/01/23 0712 02/01/23 1547  WBC 9.8 9.4  --  10.3 8.7 8.0  --   HGB 8.6* 7.4* 12.1 12.0 10.6* 10.3* 10.7*  HCT 25.5* 23.1* 35.8* 35.6* 32.1* 32.4*  --   MCV 93.1 93.9  --  86.8 87.5 90.0  --   PLT 78* 78*  --  75* 81* 103*  --      Basic Metabolic  Panel: Recent Labs  Lab 01/28/23 0348 01/29/23 0429 01/30/23 0442 01/31/23 0237 02/01/23 0657  NA 150* 148* 150* 148* 147*  K 3.6 3.0* 3.3* 3.6 3.4*  CL 120* 115* 121* 119* 114*  CO2 24 25 22 23 23   GLUCOSE  116* 94 113* 170* 85  BUN 34* 36* 39* 36* 34*  CREATININE 0.75 0.78 0.78 0.84 0.92  CALCIUM 7.2* 6.5* 6.6* 6.1* 6.4*  MG 2.0 1.9 2.7* 2.3 2.1  PHOS  --   --   --  1.4* 2.1*    GFR: Estimated Creatinine Clearance: 36.8 mL/min (by C-G formula based on SCr of 0.92 mg/dL). Recent Labs  Lab 01/29/23 0429 01/30/23 0442 01/31/23 0237 02/01/23 0657 02/01/23 0712  PROCALCITON  --   --   --  0.20  --   WBC 9.4 10.3 8.7  --  8.0     Liver Function Tests: Recent Labs  Lab 01/31/23 0237  AST 38  ALT 12  ALKPHOS 37*  BILITOT 0.8  PROT 4.2*  ALBUMIN 1.9*    No results for input(s): "LIPASE", "AMYLASE" in the last 168 hours. No results for input(s): "AMMONIA" in the last 168 hours.  ABG    Component Value Date/Time   HCO3 21.1 01/25/2023 0557   ACIDBASEDEF 3.0 (H) 01/25/2023 0557   O2SAT 34.3 01/25/2023 0557     Coagulation Profile: No results for input(s): "INR", "PROTIME" in the last 168 hours.   Cardiac Enzymes: No results for input(s): "CKTOTAL", "CKMB", "CKMBINDEX", "TROPONINI" in the last 168 hours.  HbA1C: No results found for: "HGBA1C"  CBG: Recent Labs  Lab 01/29/23 2327 01/31/23 0439  GLUCAP 113* 139*    Review of Systems: Positives in BOLD   Gen: poor po intake, fever, chills, weight change, fatigue, night sweats HEENT: Denies blurred vision, double vision, hearing loss, tinnitus, sinus congestion, rhinorrhea, sore throat, neck stiffness, dysphagia PULM: shortness of breath, cough, sputum production, hemoptysis, wheezing CV: Denies chest pain, edema, orthopnea, paroxysmal nocturnal dyspnea, palpitations GI: Denies abdominal pain, nausea, vomiting, diarrhea, hematochezia, melena, constipation, change in bowel habits GU: Denies dysuria,  hematuria, polyuria, oliguria, urethral discharge Endocrine: Denies hot or cold intolerance, polyuria, polyphagia or appetite change Derm: Denies rash, dry skin, scaling or peeling skin change Heme: Denies easy bruising, bleeding, bleeding gums Neuro: Denies headache, numbness, weakness, slurred speech, loss of memory or consciousness  Past Medical History:  She,  has a past medical history of Actinic keratosis, Basal cell carcinoma (06/14/2020), Breast cancer (HCC) (2008), Cancer [facility]), Complication of anesthesia, GERD (gastroesophageal reflux disease), Hypertension, Melanoma (HCC) (02/01/2010), Personal history of radiation therapy, Personal history of tobacco use, presenting hazards to health (05/31/2015), Tobacco abuse (01/27/2015), and Tobacco abuse (01/27/2015).   Surgical History:   Past Surgical History:  Procedure Laterality Date   BREAST EXCISIONAL BIOPSY Left 2008   + radation   BREAST EXCISIONAL BIOPSY Right 2001 2010   neg surgical bx's   BREAST LUMPECTOMY Left 2008   w/radiation   CATARACT EXTRACTION W/PHACO Right 04/12/2016   Procedure: Cataract extraction phaco and intraocular lens placement right eye;  Surgeon: Lockie Mola, MD;  Location: [facility];  Service: Ophthalmology;  Laterality: Right;   CATARACT EXTRACTION W/PHACO Left 01/24/2017   Procedure: CATARACT EXTRACTION PHACO AND INTRAOCULAR LENS PLACEMENT (IOC) Complicated left;  Surgeon: Lockie Mola, MD;  Location: [facility];  Service: Ophthalmology;  Laterality: Left;  Complicated [place]ugin   CHOLECYSTECTOMY     D&C and hysteroscopy  1996   EMBOLIZATION N/A 01/29/2023   Procedure: EMBOLIZATION;  Surgeon: Annice Needy, MD;  Location: [facility] INVASIVE CV LAB;  Service: Cardiovascular;  Laterality: N/A;   LEEP  1996   TUBAL LIGATION       Social History:   reports that she has  been smoking cigarettes. She has a 8.00 pack-year smoking history. She has never used smokeless tobacco. She  reports that she does not drink alcohol and does not use drugs.   Family History:  Her family history includes Pancreatic cancer in her father. There is no history of Breast cancer.   Allergies Allergies  Allergen Reactions   Acetaminophen-Pamabrom Rash and Swelling    Other reaction(s): Other (See Comments)  Other Reaction: RASH & SWELLING   Codeine Nausea And Vomiting   Midol Pm [Diphenhydramine-Apap (Sleep)] Swelling and Rash          Home Medications  Prior to Admission medications   Medication Sig Start Date End Date Taking? Authorizing Provider  levofloxacin (LEVAQUIN) 500 MG tablet Take 1 tablet (500 mg total) by mouth daily for 7 days. 01/23/23 01/30/23  Alinda Dooms, NP  acetaminophen (TYLENOL) 650 MG CR tablet Take 650 mg by mouth every 8 (eight) hours as needed for pain.    [provider]  amLODipine (NORVASC) 2.5 MG tablet Take 2.5 mg by mouth daily.  Patient not taking: Reported on 01/19/2023 01/14/16   [provider]  atorvastatin (LIPITOR) 20 MG tablet Take 20 mg by mouth daily.    [provider]  calcium carbonate (OSCAL) 1500 (600 CA) MG TABS tablet Take 600 mg of elemental calcium by mouth 2 (two) times daily with a meal.    [provider]  citalopram (CELEXA) 10 MG tablet Take 10 mg by mouth daily.    [provider]  gabapentin (NEURONTIN) 300 MG capsule Take 300 mg by mouth in the morning, at noon, and at bedtime. 11/27/22 12/27/22  [provider]  lansoprazole (PREVACID) 30 MG capsule Take 1 capsule (30 mg total) by mouth daily at 12 noon. 12/22/22   Carmina Miller, MD  meloxicam (MOBIC) 15 MG tablet Take 1 tablet (15 mg total) by mouth daily. 12/22/22   Earna Coder, MD  morphine (MSIR) 15 MG tablet 1/2 -1 pill every 8- 12 hours as needed. 01/19/23   Earna Coder, MD  ondansetron (ZOFRAN) 8 MG tablet One pill every 8 hours as needed for nausea/vomitting. 12/07/22   Earna Coder, MD   sucralfate (CARAFATE) 1 g tablet Take 1 tablet (1 g total) by mouth 3 (three) times daily. Dissolve tablet in 3to 4 tables spoons warm water swish and swallow 12/22/22   Chrystal, Sherrine Maples, MD  triamterene-hydrochlorothiazide (DYAZIDE) 37.5-25 MG capsule Take 1 capsule by mouth daily.  Patient not taking: Reported on 01/19/2023 06/10/15   [provider]   Scheduled Meds:  sodium chloride   Intravenous Once   arformoterol  15 mcg Nebulization BID   vitamin C  500 mg Oral BID   Chlorhexidine Gluconate Cloth  6 each Topical QHS   citalopram  10 mg Oral Daily   feeding supplement  237 mL Oral TID BM   gabapentin  300 mg Oral TID   multivitamin with minerals  1 tablet Oral Daily   nystatin  5 mL Oral QID   pantoprazole (PROTONIX) IV  40 mg Intravenous Q12H   revefenacin  175 mcg Nebulization Daily   Continuous Infusions:  sodium chloride Stopped (01/28/23 0906)   piperacillin-tazobactam (ZOSYN)  IV     potassium PHOSPHATE IVPB (in mmol) 15 mmol (02/01/23 1224)   sodium chloride     PRN Meds:.albuterol, docusate sodium, fentaNYL (SUBLIMAZE) injection, HYDROmorphone (DILAUDID) injection, morphine, ondansetron (ZOFRAN) IV, ondansetron (ZOFRAN) IV, polyethylene glycol    Total  visit time 55 minutes    C. Danice Goltz, MD Advanced Bronchoscopy PCCM [facility]    *This note was dictated using voice recognition software/Dragon.  Despite best efforts to proofread, errors can occur which can change the meaning. Any transcriptional errors that result from this process are unintentional and may not be fully corrected at the time of dictation.

## 2023-02-01 NOTE — Evaluation (Signed)
Occupational Therapy Evaluation Patient Details Name: Crystal Mcmahon MRN: [id number] DOB: 14-Jul-1941 Today's Date: 02/01/2023   History of Present Illness Pt admitted for sepsis due to PNA with complaints of fever, hypoxia, and tachy. 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   Clinical Impression   Pt received semi-reclined in bed; cousin and three friends in room (cousin stayed for session; three friends left). Appearing lethargic, but alert; willing to work with OT on bed mobility; requiring +2 assist today; incontinent BM (pt assisted with hygiene before mobilizing to EOB). See flowsheet below for further details of session. Left semi-reclined, very fatigued, cousin in room, bed alarm on, with all needs in reach.  Patient will benefit from continued OT while in acute care.       Recommendations for follow up therapy are one component of a multi-disciplinary discharge planning process, led by the attending physician.  Recommendations may be updated based on patient status, additional functional criteria and insurance authorization.   Assistance Recommended at Discharge Frequent or constant Supervision/Assistance  Patient can return home with the following Two people to help with walking and/or transfers;A lot of help with bathing/dressing/bathroom;Assistance with cooking/housework;Assist for transportation;Help with stairs or ramp for entrance    Functional Status Assessment     Equipment Recommendations  Other (comment) (defer to next venue of care)    Recommendations for Other Services       Precautions / Restrictions Precautions Precautions: Fall Restrictions Weight Bearing Restrictions: No      Mobility Bed Mobility Overal bed mobility: Needs Assistance Bed  Mobility: Supine to Sit, Sit to Supine, Rolling Rolling: Mod assist, +2 for physical assistance   Supine to sit: Min assist, Mod assist, +2 for physical assistance Sit to supine: Total assist, +2 for physical assistance   General bed mobility comments: rolled at beginning of session for new bed linens 2/2 incontinent BM, then t/f to EOB.    Transfers Overall transfer level: Needs assistance Equipment used: 2 person hand held assist (and chuck pad) Transfers: Sit to/from Stand Sit to Stand: Max assist, +2 physical assistance (attempted 2 stands; first did not clear the bed; second barely cleared bed, but not much.)                  Balance Overall balance assessment: Needs assistance, History of Falls Sitting-balance support: Feet supported Sitting balance-Leahy Scale: Fair         Standing balance comment: unable to fully stand today                           ADL either performed or assessed with clinical judgement   ADL     Eating/Feeding Details (indicate cue type and reason): Cousin assisting pt to eat some magic cup when OT arrived. Pt only able to eat a few bites.                                   General ADL Comments: Pt will require extensive assist for all UB/LB dressing, bathing (bed level only at this time), grooming (set up-MIN A bed level due to fatigue). Not yet safe to t/f to toilet; depedent bed level toileting. Dependent today to change bed  sheets and chucks; pt able to help some with cleaning vaginal area at bed level with wet wipe; still required MAX A for hygiene of loose bowel movement; RN called into room, as BM appeared to have blood as well. RN noted.     Vision   Additional Comments: Pt with glasses on during session     Perception     Praxis      Pertinent Vitals/Pain Pain Assessment Pain Assessment: No/denies pain     Hand Dominance     Extremity/Trunk Assessment Upper Extremity Assessment Upper Extremity  Assessment: Generalized weakness (weak, needing assist to reach across body for bed rails)   Lower Extremity Assessment Lower Extremity Assessment: Generalized weakness       Communication     Cognition Arousal/Alertness: Lethargic Behavior During Therapy: Flat affect Overall Cognitive Status: Difficult to assess                                 General Comments: Pt with waxing and waning alertness, worse at end of session when pt very fatigued. intermittently not answering OT's questions.     General Comments  HR 87 BMP, O2 on 1.5L 93%. Stable throughout session.    Exercises     Shoulder Instructions      Home Living                                          Prior Functioning/Environment                          OT Problem List:        OT Treatment/Interventions:      OT Goals(Current goals can be found in the care plan section) Acute Rehab OT Goals Patient Stated Goal: Get better OT Goal Formulation: With patient/family Time For Goal Achievement: 02/14/23 Potential to Achieve Goals: Good ADL Goals Pt Will Perform Grooming: with modified independence;standing Pt Will Perform Lower Body Dressing: with modified independence;sit to/from stand Pt Will Transfer to Toilet: with modified independence;ambulating Pt Will Perform Toileting - Clothing Manipulation and hygiene: with modified independence;sit to/from stand Additional ADL Goal #1: Pt will verbalize 2-3 Energy conservation techniques with Min VC for improved activity tolerance in ADL/IADL tasks  OT Frequency: Min 3X/week    Co-evaluation PT/OT/SLP Co-Evaluation/Treatment: Yes Reason for Co-Treatment: Complexity of the patient's impairments (multi-system involvement);For patient/therapist safety   OT goals addressed during session: ADL's and self-care;Strengthening/ROM      AM-PAC OT "6 Clicks" Daily Activity     Outcome Measure Help from another person eating  meals?: A Lot Help from another person taking care of personal grooming?: A Little Help from another person toileting, which includes using toliet, bedpan, or urinal?: Total Help from another person bathing (including washing, rinsing, drying)?: Total Help from another person to put on and taking off regular upper body clothing?: Total Help from another person to put on and taking off regular lower body clothing?: A Lot 6 Click Score: 10   End of Session Equipment Utilized During Treatment: Oxygen Nurse Communication: Mobility status  Activity Tolerance: Patient limited by fatigue Patient left: in bed;with call bell/phone within reach;with family/visitor present (cousin Doris in room)  OT Visit Diagnosis: Other abnormalities of gait and mobility (R26.89);Muscle weakness (generalized) (M62.81);History of falling (Z91.81)  Time: 1435-1520 OT Time Calculation (min): 45 min Charges:  OT General Charges $OT Visit: 1 Visit OT Treatments $Self Care/Home Management : 8-22 mins  Linward Foster, MS, OTR/L  Alvester Morin 02/01/2023, 3:49 PM

## 2023-02-02 ENCOUNTER — Inpatient Hospital Stay: Payer: Medicare Other

## 2023-02-02 ENCOUNTER — Inpatient Hospital Stay (HOSPITAL_COMMUNITY)
Admit: 2023-02-02 | Discharge: 2023-02-02 | Disposition: A | Discharge status: Home / Self Care | Payer: Medicare Other | Attending: Internal Medicine | Admitting: Internal Medicine

## 2023-02-02 DIAGNOSIS — I38 Endocarditis, valve unspecified: Secondary | ICD-10-CM | POA: Diagnosis not present

## 2023-02-02 DIAGNOSIS — R6521 Severe sepsis with septic shock: Secondary | ICD-10-CM | POA: Diagnosis not present

## 2023-02-02 DIAGNOSIS — Z515 Encounter for palliative care: Secondary | ICD-10-CM | POA: Diagnosis not present

## 2023-02-02 DIAGNOSIS — C3490 Malignant neoplasm of unspecified part of unspecified bronchus or lung: Secondary | ICD-10-CM | POA: Diagnosis not present

## 2023-02-02 DIAGNOSIS — J690 Pneumonitis due to inhalation of food and vomit: Secondary | ICD-10-CM | POA: Diagnosis not present

## 2023-02-02 DIAGNOSIS — A419 Sepsis, unspecified organism: Secondary | ICD-10-CM | POA: Diagnosis not present

## 2023-02-02 DIAGNOSIS — J9621 Acute and chronic respiratory failure with hypoxia: Secondary | ICD-10-CM | POA: Diagnosis not present

## 2023-02-02 LAB — ECHOCARDIOGRAM COMPLETE
AR max vel: 1.55 cm2
AV Area VTI: 1.9 cm2
AV Area mean vel: 1.6 cm2
AV Mean grad: 3 mmHg
AV Peak grad: 5.4 mmHg
Ao pk vel: 1.17 m/s
Area-P 1/2: 5.23 cm2
Height: 60 in
MV VTI: 1.73 cm2
S' Lateral: 2.2 cm
Weight: 2038.81 oz

## 2023-02-02 LAB — CBC
HCT: 31.1 % — ABNORMAL LOW (ref 36.0–46.0)
Hemoglobin: 9.9 g/dL — ABNORMAL LOW (ref 12.0–15.0)
MCH: 28.6 pg (ref 26.0–34.0)
MCHC: 31.8 g/dL (ref 30.0–36.0)
MCV: 89.9 fL (ref 80.0–100.0)
Platelets: 112 10*3/uL — ABNORMAL LOW (ref 150–400)
RBC: 3.46 MIL/uL — ABNORMAL LOW (ref 3.87–5.11)
RDW: 18.3 % — ABNORMAL HIGH (ref 11.5–15.5)
WBC: 7.7 10*3/uL (ref 4.0–10.5)
nRBC: 0 % (ref 0.0–0.2)

## 2023-02-02 LAB — BASIC METABOLIC PANEL
Anion gap: 9 (ref 5–15)
BUN: 32 mg/dL — ABNORMAL HIGH (ref 8–23)
CO2: 25 mmol/L (ref 22–32)
Calcium: 6.4 mg/dL — CL (ref 8.9–10.3)
Chloride: 112 mmol/L — ABNORMAL HIGH (ref 98–111)
Creatinine, Ser: 0.89 mg/dL (ref 0.44–1.00)
GFR, Estimated: >60 mL/min (ref >60)
Glucose, Bld: 80 mg/dL (ref 70–99)
Potassium: 2.8 mmol/L — ABNORMAL LOW (ref 3.5–5.1)
Sodium: 146 mmol/L — ABNORMAL HIGH (ref 135–145)

## 2023-02-02 LAB — CULTURE, RESPIRATORY W GRAM STAIN

## 2023-02-02 LAB — PHOSPHORUS: Phosphorus: 2.7 mg/dL (ref 2.5–4.6)

## 2023-02-02 LAB — MAGNESIUM: Magnesium: 1.8 mg/dL (ref 1.7–2.4)

## 2023-02-02 MED ORDER — POTASSIUM CHLORIDE 10 MEQ/100ML IV SOLN
10.0000 meq | INTRAVENOUS | Status: AC
  Administered 2023-02-02 (×3): 10 meq via INTRAVENOUS
  Filled 2023-02-02: qty 100

## 2023-02-02 MED ORDER — POTASSIUM CHLORIDE CRYS ER 20 MEQ PO TBCR
40.0000 meq | EXTENDED_RELEASE_TABLET | Freq: Two times a day (BID) | ORAL | Status: AC
  Administered 2023-02-02 (×2): 40 meq via ORAL
  Filled 2023-02-02 (×2): qty 2

## 2023-02-02 MED ORDER — OXYCODONE HCL 5 MG PO TABS
5.0000 mg | ORAL_TABLET | ORAL | Status: DC | PRN
  Administered 2023-02-07: 5 mg via ORAL
  Filled 2023-02-02: qty 1

## 2023-02-02 MED ORDER — GADOBUTROL 1 MMOL/ML IV SOLN
5.0000 mL | Freq: Once | INTRAVENOUS | Status: AC | PRN
  Administered 2023-02-02: 5 mL via INTRAVENOUS

## 2023-02-02 MED ORDER — CALCIUM GLUCONATE-NACL 2-0.675 GM/100ML-% IV SOLN
2.0000 g | Freq: Once | INTRAVENOUS | Status: AC
  Administered 2023-02-02: 2000 mg via INTRAVENOUS
  Filled 2023-02-02: qty 100

## 2023-02-02 NOTE — Plan of Care (Signed)

## 2023-02-02 NOTE — Progress Notes (Signed)
*  PRELIMINARY RESULTS* Echocardiogram 2D Echocardiogram has been performed.  Crystal Mcmahon 02/02/2023, 8:49 AM

## 2023-02-02 NOTE — Progress Notes (Signed)
Crystal Mcmahon   DOB:[birth year]   ZO#:[id number]    Subjective: No acute issues overnight.  Patient denies any worsening shortness of breath or cough.   Patient MRI brain this morning.   Objective:  Vitals:   02/02/23 0814 02/02/23 1521  BP: 127/63 115/62  Pulse: 91 84  Resp: 15 14  Temp: 98.2 F (36.8 C) 97.8 F (36.6 C)  SpO2: 93% 93%     Intake/Output Summary (Last 24 hours) at 02/02/2023 1537 Last data filed at 02/02/2023 0513 Gross per 24 hour  Intake 547.24 ml  Output --  Net 547.24 ml   Patient awake; more verbal today.  Accompanied by multiple family/friends.  Physical Exam Vitals and nursing note reviewed.  HENT:     Head: Normocephalic and atraumatic.     Mouth/Throat:     Pharynx: Oropharynx is clear.  Eyes:     Extraocular Movements: Extraocular movements intact.     Pupils: Pupils are equal, round, and reactive to light.  Cardiovascular:     Rate and Rhythm: Normal rate and regular rhythm.  Pulmonary:     Comments: Decreased breath sounds bilaterally.  Abdominal:     Palpations: Abdomen is soft.  Musculoskeletal:        General: Normal range of motion.     Cervical back: Normal range of motion.  Skin:    General: Skin is warm.  Neurological:     General: No focal deficit present.     Mental Status: She is alert and oriented to person, place, and time.  Psychiatric:        Behavior: Behavior normal.        Judgment: Judgment normal.      Labs:  Lab Results  Component Value Date   WBC 7.7 02/02/2023   HGB 9.9 (L) 02/02/2023   HCT 31.1 (L) 02/02/2023   MCV 89.9 02/02/2023   PLT 112 (L) 02/02/2023   NEUTROABS 6.3 01/25/2023    Lab Results  Component Value Date   NA 146 (H) 02/02/2023   K 2.8 (L) 02/02/2023   CL 112 (H) 02/02/2023   CO2 25 02/02/2023    Studies:  No results found.  No problem-specific Assessment & Plan notes found for this encounter.  # 82 year old female patient with multiple medical problems including COPD/history  of smoking metastatic lung cancer-currently admitted to hospital for pneumonia/septic shock.   # Metastatic left upper lobe lung cancer-s/p cycle #1 of Keytruda 05/06.  Further therapy is currently on hold given patient's acute acute illness.  Discussed that patient's subsequent treatments will be started only if patient clinically improves/pneumonia resolves.   # Bilateral pneumonia with septic emboli/sepsis-question complicated by aspiration-s/p evaluation with pulmonary.  On antibiotics.  However if patient's pneumonia is not improved/worsens-hospice will be reasonable.  # Mental status changes-question septic emboli to the brain.  Awaiting MRI brain.   # Upper GI bleed/duodenal ulcer-radiation esophagitis-s/p EGD-no further bleeding noted.   # Pain secondary to underlying tumor involving left cervical region.  Earna Coder, MD 02/02/2023  3:37 PM

## 2023-02-02 NOTE — Progress Notes (Signed)
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  She presented to [facility] ER on 05/7 with fevers, tachycardia, and hypoxia.  At baseline pt wears 2L O2 via nasal canula.  She has been followed by oncology and received her first dose of keytruda on 01/22/23.  She was recently taken off her antihypertensives due to hypotension and s/p dex taper.     Upon arrival to the ER pts O2 sats were 84% on baseline O2@2L  via nasal canula requiring increase in O2 to 4L with improvement in oxygenation.  CXR concerning for pneumonia with trace pleural effusions and unchanged left apical mass.  Sepsis protocol initiated and pt received 1,750 ml iv fluid bolus, metronidazole, vancomycin, and cefepime.  Pt remained hypotensive requiring low dose levophed gtt.  PCCM team contacted for ICU admission.  Pt was off pressor and transferred to hospitalist service on 01/24/23. Patient condition does not seem to be improving, CT chest performed on 5/15 showed bilateral upper lobe worsening pneumonia with cavitary lesion, consistent with aspiration pneumonia.  Zosyn was started.   Principal Problem:   Septic shock (HCC) Active Problems:   Stage 4 lung cancer (HCC)   Pneumonia of right upper lobe due to infectious organism   Palliative care encounter   Pressure injury of skin   Malnutrition of moderate degree   Gastrointestinal hemorrhage   Hypernatremia   Hypophosphatemia   Thrombocytopenia (HCC)   Duodenal ulcer with hemorrhage   Acute on chronic respiratory failure with hypoxia (HCC)   Aspiration pneumonia (HCC)   Assessment and Plan: #Septic and hypovolemic shock  #Mildly elevated troponin likely secondary to demand ischemia in the setting of sepsis Bilateral  upper lobe pneumonia, likely aspiration pneumonia. Patient condition appears to be improving, no longer has any hypotension.  Repeated chest images static with chest CT scan on 5/15 showed bilateral upper lobe pneumonia, with some cavitary lesion.  Discussed with pulmonology, this most likely aspiration pneumonia with Pseudomonas risk.  Started on Zosyn, will change to oral antibiotics with Pseudomonas coverage at discharge for total course of 21 days.  Echocardiogram will be repeated to make sure patient does not have valvular involvement. Echocardiogram was performed, pending results. Sputum culture was also sent out, pending results. Discussed with pulmonology, patient's is still pretty sleepy, concern for patient long-term prognosis.  Palliative care consult obtained.  Also obtain MRI of the brain to make sure patient does not have brain metastasis.   # AKI secondary to septic shock.  Ruled in. Hypernatremia. Hypokalemia. Hypophosphatemia. She had improved, sodium still elevated, will hold additional metolazone and she will potassium is better.  Will continue replete potassium today.     # acute blood loss anemia #Iron deficiency anemia  Thrombocytopenia.  # Upper GI bleed 2/2 duodenal ulcer # gastritis --coffee ground emesis and large melanotic stool on 5/9 --EGD 5/9 found large ulcer just past the pylorus in the duodenum. Another ulcer in the pylorus. The culprit lesion was the duodenal ulcer which was treated.  --found re-bleeding morning of 5/13, vascular performed coil embolization of the pancreaticcoduodenal artery and gastroduodenal artery. Patient still on PPI IV twice a day, hemoglobin seem to be better. Patient had a episode of black stool  yesterday, discussed with Dr. Timothy Lasso, patient is status post embolization for GI bleed, some black stool is expected.  Continue monitor hemoglobin.  Continue PPI.   #Acute on chronic hypoxic respiratory failure  Chronic O2 @2L  O2 and Current  Everyday Smoker --2/2 PNA in the setting of lung cancer --increased O2 requirement on 5/11 to 7L.  Did get IVF in the past days.  O2 down to 4L next day after diuresis.  Though should consider pembro induced lung toxicity at this point, per pulm. Oxygenation has back to baseline.   #Right pleural effusion 2/2 parapneumonic effusion --thoracentesis on 5/8, with 330 ml removed Lab test from the thoracentesis showed exudates, but the majority of the white cells are lymphocytes, not infectious.  Cytology did not see any malignant cells.   #Stage IV lung cancer with mets to the bone Chest CT scan did not show worsening lung cancer. Obtain MRI of the brain to make sure patient does not have brain mets.   Non-severe (moderate) malnutrition in context of chronic illness  --supplements per dietician          Subjective:  Patient still very sleepy, sleeping most of the time.  Very poor appetite.  Short of breath has improved.  Physical Exam: Vitals:   02/01/23 2041 02/02/23 0428 02/02/23 0751 02/02/23 0814  BP: (!) 116/59 (!) 145/72  127/63  Pulse: 86 80  91  Resp: 16 16  15   Temp: 98.6 F (37 C) 98.7 F (37.1 C)  98.2 F (36.8 C)  TempSrc:  Oral    SpO2: 93% 95% 92% 93%  Weight:  57.8 kg    Height:       General exam: Appears calm and comfortable  Respiratory system: Clear to auscultation. Respiratory effort normal. Cardiovascular system: S1 & S2 heard, RRR. No JVD, murmurs, rubs, gallops or clicks. No pedal edema. Gastrointestinal system: Abdomen is nondistended, soft and nontender. No organomegaly or masses felt. Normal bowel sounds heard. Central nervous system: Drowsy and oriented x 2. Extremities: Symmetric 5 x 5 power. Skin: No rashes, lesions or ulcers Psychiatry: Flat affect.    Data Reviewed:  Lab results reviewed.  Family Communication: Son updated at bedside.  Disposition: Status is: Inpatient Remains inpatient appropriate because: Severity of disease, IV  treatment.     Time spent: 35 minutes  Author: Marrion Coy, MD 02/02/2023 11:12 AM  For on call review [email or web address].

## 2023-02-02 NOTE — Progress Notes (Signed)
Palliative Medicine [facility] at [facility] [phone] [phone]   Name: Crystal Mcmahon Date: 02/02/2023 MRN: [id number]  DOB: [birth year]  Patient Care Team: [name], MD as PCP - General (Family Medicine) Glory Buff, RN as Oncology Nurse Navigator    REASON FOR CONSULTATION: Crystal Mcmahon is a 82 y.o. 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  Patient admitted to the hospital on 01/23/2023 with sepsis from pneumonia.  Her hospitalization has been complicated by GI bleed.  She was referred to palliative care to address goals. .    CODE STATUS: DNR  PAST MEDICAL HISTORY: Past Medical History:  Diagnosis Date   Actinic keratosis    Basal cell carcinoma 06/14/2020   posterior neck, EDC   Breast cancer (HCC) 2008   left   Cancer (HCC)    melanoma   Complication of anesthesia    slow to wake up after surgery   GERD (gastroesophageal reflux disease)    Hypertension    Melanoma (HCC) 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 Malignant melanoma, lentigo maligna type. Clark's level II, Breslow's 0.31mm   Personal history of radiation therapy    Left breast   Personal history of tobacco use, presenting hazards to health 05/31/2015   Tobacco abuse 01/27/2015   Tobacco abuse 01/27/2015    PAST SURGICAL HISTORY:  Past Surgical History:  Procedure Laterality Date   BREAST EXCISIONAL BIOPSY Left 2008   + radation   BREAST EXCISIONAL BIOPSY Right 2001 2010   neg surgical bx's   BREAST LUMPECTOMY Left 2008   w/radiation   CATARACT EXTRACTION W/PHACO Right 04/12/2016   Procedure: Cataract extraction phaco and intraocular lens placement right eye;  Surgeon: Lockie Mola, MD;  Location: [facility];  Service: Ophthalmology;  Laterality: Right;   CATARACT EXTRACTION W/PHACO Left 01/24/2017   Procedure: CATARACT EXTRACTION PHACO AND  INTRAOCULAR LENS PLACEMENT (IOC) Complicated left;  Surgeon: Lockie Mola, MD;  Location: [facility];  Service: Ophthalmology;  Laterality: Left;  Complicated Malyugin   CHOLECYSTECTOMY     D&C and hysteroscopy  1996   EMBOLIZATION N/A 01/29/2023   Procedure: EMBOLIZATION;  Surgeon: Annice Needy, MD;  Location: [facility] INVASIVE [facility];  Service: Cardiovascular;  Laterality: N/A;   LEEP  1996   TUBAL LIGATION      HEMATOLOGY/ONCOLOGY HISTORY:  Oncology History Overview Note  MARCH 2024- MRi cervical spine- IMPRESSION: 1. Findings compatible with multifocal osseous metastatic disease, as described above. 2. Abnormal pleuroparenchymal thickening/mass at the left lung apex, concerning for Pancoast tumor or less likely metastasis. This malignancy probably involves the left C7-T1 and T1-T2 foramina and encroaches on more inferior left thoracic foramina, incompletely imaged. Recommend dedicated CT of the chest (preferably with contrast) to further evaluate the chest. Also, dedicated MRI of the thoracic spine with contrast could further characterize thoracic extent if clinically warranted.  SOFT TISSUE NODULE, RIGHT FLANK; ULTRASOUND-GUIDED BIOPSY:  - POORLY DIFFERENTIATED CARCINOMA COMPATIBLE WITH METASTATIC  ADENOCARCINOMA OF LUNG ORIGIN.   Hx DCIS; history of superficial early stage melanoma; and stage 1 thyroid cancer      Malignant neoplasm of breast (HCC)  10/05/2006 Initial Diagnosis   Malignant neoplasm of left breast (HCC), Tis N0 M0, ER positive PR negative   10/22/2006 Surgery   Lumpectomy   10/22/2006 -  Radiation Therapy     06/22/2007 - 06/21/2012 Anti-estrogen oral therapy   Arimidex  Stage 4 lung cancer (HCC)  12/22/2022 Initial Diagnosis   Primary cancer of left upper lobe of lung   12/22/2022 Cancer Staging   Staging form: Lung, AJCC 8th Edition - Clinical: Stage IVB (cT2, cN2, cM1c) - Signed by Earna Coder, MD on 12/22/2022   01/22/2023 -   Chemotherapy   Patient is on Treatment Plan : LUNG NSCLC Pembrolizumab (200) q21d       ALLERGIES:  is allergic to acetaminophen-pamabrom, codeine, and midol pm [diphenhydramine-apap (sleep)].  MEDICATIONS:  Current Facility-Administered Medications  Medication Dose Route Frequency Provider Last Rate Last Admin   0.9 %  sodium chloride infusion (Manually program via Guardrails IV Fluids)   Intravenous Once Annice Needy, MD       0.9 %  sodium chloride infusion  250 mL Intravenous Continuous Annice Needy, MD   Stopped at 01/28/23 0906   albuterol (PROVENTIL) (2.5 MG/3ML) 0.083% nebulizer solution 2.5 mg  2.5 mg Nebulization Q6H PRN Salena Saner, MD       arformoterol [facility]) nebulizer solution 15 mcg  15 mcg Nebulization BID Salena Saner, MD   15 mcg at 02/02/23 0750   ascorbic acid (VITAMIN C) tablet 500 mg  500 mg Oral BID Annice Needy, MD   500 mg at 02/02/23 1013   calcium gluconate 2 g/ 100 mL sodium chloride IVPB  2 g Intravenous Once Mila Merry A, RPH 100 mL/hr at 02/02/23 1037 2,000 mg at 02/02/23 1037   Chlorhexidine Gluconate Cloth 2 % PADS 6 each  6 each Topical QHS Annice Needy, MD   6 each at 02/02/23 1007   citalopram (CELEXA) tablet 10 mg  10 mg Oral Daily Annice Needy, MD   10 mg at 02/02/23 1013   docusate sodium (COLACE) capsule 100 mg  100 mg Oral BID PRN Annice Needy, MD       feeding supplement (ENSURE ENLIVE / ENSURE PLUS) liquid 237 mL  237 mL Oral TID BM Darlin Priestly, MD   237 mL at 02/02/23 1007   fentaNYL (SUBLIMAZE) injection 12.5 mcg  12.5 mcg Intravenous Once PRN Annice Needy, MD       gabapentin (NEURONTIN) capsule 300 mg  300 mg Oral TID Annice Needy, MD   300 mg at 02/02/23 1013   HYDROmorphone (DILAUDID) injection 1 mg  1 mg Intravenous Once PRN Annice Needy, MD       morphine (MSIR) tablet 7.5 mg  7.5 mg Oral Q8H PRN Annice Needy, MD   7.5 mg at 01/27/23 0116   multivitamin with minerals tablet 1 tablet  1 tablet Oral Daily Annice Needy, MD   1  tablet at 02/02/23 1013   nystatin (MYCOSTATIN) 100000 UNIT/ML suspension 500,000 Units  5 mL Oral QID Foust, Katy L, NP   500,000 Units at 02/02/23 1014   ondansetron (ZOFRAN) injection 4 mg  4 mg Intravenous Q6H PRN Annice Needy, MD   4 mg at 01/30/23 0940   ondansetron (ZOFRAN) injection 4 mg  4 mg Intravenous Q6H PRN Annice Needy, MD       pantoprazole (PROTONIX) injection 40 mg  40 mg Intravenous Q12H Annice Needy, MD   40 mg at 02/02/23 1013   piperacillin-tazobactam (ZOSYN) IVPB 3.375 g  3.375 g Intravenous Tobe Sos, MD 12.5 mL/hr at 02/02/23 0513 Infusion Verify at 02/02/23 0513   polyethylene glycol (MIRALAX / GLYCOLAX) packet 17 g  17 g Oral  Daily PRN Annice Needy, MD       potassium chloride 10 mEq in 100 mL IVPB  10 mEq Intravenous Q1 Hr x 3 Marrion Coy, MD 100 mL/hr at 02/02/23 1035 10 mEq at 02/02/23 1035   potassium chloride SA (KLOR-CON M) CR tablet 40 mEq  40 mEq Oral BID Marrion Coy, MD   40 mEq at 02/02/23 1013   revefenacin (YUPELRI) nebulizer solution 175 mcg  175 mcg Nebulization Daily Salena Saner, MD   175 mcg at 02/02/23 0749   sodium chloride 0.9 % bolus 1,000 mL  1,000 mL Intravenous Once Andris Baumann, MD        VITAL SIGNS: BP 127/63 (BP Location: Left Arm)   Pulse 91   Temp 98.2 F (36.8 C)   Resp 15   Ht 5' (1.524 m)   Wt 127 lb 6.8 oz (57.8 kg)   SpO2 93%   BMI 24.89 kg/m  Filed Weights   01/30/23 0500 02/01/23 0630 02/02/23 0428  Weight: 124 lb 1.9 oz (56.3 kg) 126 lb 8.7 oz (57.4 kg) 127 lb 6.8 oz (57.8 kg)    Estimated body mass index is 24.89 kg/m as calculated from the following:   Height as of this encounter: 5' (1.524 m).   Weight as of this encounter: 127 lb 6.8 oz (57.8 kg).  LABS: CBC:    Component Value Date/Time   WBC 7.7 02/02/2023 0440   HGB 9.9 (L) 02/02/2023 0440   HGB 9.7 (L) 01/19/2023 1502   HGB 14.2 12/30/2014 1033   HCT 31.1 (L) 02/02/2023 0440   HCT 42.6 12/30/2014 1033   PLT 112 (L) 02/02/2023 0440    PLT 293 01/19/2023 1502   PLT 266 12/30/2014 1033   MCV 89.9 02/02/2023 0440   MCV 92 12/30/2014 1033   NEUTROABS 6.3 01/25/2023 0552   NEUTROABS 5.4 12/30/2014 1033   LYMPHSABS 0.7 01/25/2023 0552   LYMPHSABS 1.8 12/30/2014 1033   MONOABS 0.2 01/25/2023 0552   MONOABS 0.5 12/30/2014 1033   EOSABS 0.0 01/25/2023 0552   EOSABS 0.0 12/30/2014 1033   BASOSABS 0.0 01/25/2023 0552   BASOSABS 0.1 12/30/2014 1033   Comprehensive Metabolic Panel:    Component Value Date/Time   NA 146 (H) 02/02/2023 0440   NA 136 12/30/2014 1033   K 2.8 (L) 02/02/2023 0440   K 3.0 (L) 12/30/2014 1033   CL 112 (H) 02/02/2023 0440   CL 100 (L) 12/30/2014 1033   CO2 25 02/02/2023 0440   CO2 30 12/30/2014 1033   BUN 32 (H) 02/02/2023 0440   BUN 21 (H) 12/30/2014 1033   CREATININE 0.89 02/02/2023 0440   CREATININE 1.14 (H) 01/19/2023 1502   CREATININE 0.99 12/30/2014 1033   GLUCOSE 80 02/02/2023 0440   GLUCOSE 117 (H) 12/30/2014 1033   CALCIUM 6.4 (LL) 02/02/2023 0440   CALCIUM 9.2 12/30/2014 1033   AST 38 01/31/2023 0237   AST 38 01/19/2023 1502   ALT 12 01/31/2023 0237   ALT 27 01/19/2023 1502   ALT 15 12/30/2014 1033   ALKPHOS 37 (L) 01/31/2023 0237   ALKPHOS 47 12/30/2014 1033   BILITOT 0.8 01/31/2023 0237   BILITOT 0.4 01/19/2023 1502   PROT 4.2 (L) 01/31/2023 0237   PROT 7.3 12/30/2014 1033   ALBUMIN 1.9 (L) 01/31/2023 0237   ALBUMIN 4.1 12/30/2014 1033    RADIOGRAPHIC STUDIES: DG Chest Port 1 View  Result Date: 01/31/2023 CLINICAL DATA:  Pneumonia. EXAM: PORTABLE CHEST 1 VIEW COMPARISON:  Chest radiograph 01/27/2023 FINDINGS: The cardiomediastinal silhouette is unchanged. Dense airspace consolidation in the right upper lobe is stable to mildly increased compared to the prior study. Less extensive consolidation in the left upper lobe has mildly progressed. There are hazy densities in the right greater than left lung bases with pleural effusions demonstrated on today's subsequent CT.  Nodular lung densities are again noted and more completely characterized on the subsequent CT. No pneumothorax is identified. Embolization coils are noted in the upper abdomen. IMPRESSION: Mild worsening of bilateral upper lobe consolidation consistent with pneumonia. Electronically Signed   By: Sebastian Ache M.D.   On: 01/31/2023 13:55   CT CHEST WO CONTRAST  Result Date: 01/31/2023 CLINICAL DATA:  History of stage IV lung cancer. Patient presents with fever, tachycardia and hypoxia. * Tracking Code: BO *. EXAM: CT CHEST WITHOUT CONTRAST TECHNIQUE: Multidetector CT imaging of the chest was performed following the standard protocol without IV contrast. RADIATION DOSE REDUCTION: This exam was performed according to the departmental dose-optimization program which includes automated exposure control, adjustment of the mA and/or kV according to patient size and/or use of iterative reconstruction technique. COMPARISON:  01/23/2023 FINDINGS: Cardiovascular: Heart size is normal. Aortic atherosclerosis. Coronary artery calcification. No pericardial effusion. Mediastinum/Nodes: Right lobe of thyroid gland is not seen. Left lobe appears normal. Trachea appears patent and midline. Unremarkable appearance of the esophagus. The index left supraclavicular lymph node measures 8 mm, image 22/2. Stable from previous exam. Index left pre vascular lymph node measures 0.9 cm, image 34/2. Previously 1.2 cm. Index AP window lymph node measures 2.1 x 1.6 cm, image 51/2. Unchanged from previous exam. Lungs/Pleura: Moderate right pleural effusion and small left pleural effusion identified. Progressive scratch again seen is extensive airspace consolidation within the right upper lobe which appears progressive when compared with the exam from 01/23/2023. Interval development of extensive airspace disease within the left upper lobe and left apex, similar to the right upper lobe. Multiple pulmonary nodules are again seen. The reference  nodule within the left lower lobe now appears cavitary measuring 1.9 x 1.2 cm, image 63/3. Previously 1.7 x 1.3 cm. Previous reference nodule in the superior segment of the right lower lobe has undergone central cavitation and decompression. This now has a flattened, discoid configuration measuring 0.6 x 0.3 cm. Previously 0.8 by 0.6 cm. Previous nodule in the superior segment of the left lower lobe now appears cavitary measuring 1.1 x 0.7 cm. Previously 1.8 x 1.3 cm. Upper Abdomen: Fat containing right posterior diaphragmatic hernia is again noted which contains fat only, image 110/2. Small hiatal hernia identified. 3.4 cm simple appearing cyst arises off the upper pole of right kidney. No follow-up imaging recommended.Status post cholecystectomy. Embolization coils identified within the upper abdomen. Musculoskeletal: Chronic anterolateral right rib fractures. No acute or suspicious osseous findings. IMPRESSION: 1. Interval progression of dense airspace consolidation within the right upper lobe. New dense airspace consolidation within the left upper lobe compared with 01/23/2023. Imaging findings are concerning for worsening multi focal pneumonia. 2. Interval cavitation of previously noted lung nodules which in the setting of septicemia may reflect areas of septic emboli. Metastatic disease is considered less favored but would be difficult to exclude with a high degree of certainty. Follow-up imaging recommended to ensure resolution. 3. Similar appearance of enlarged upper abdominal lymph nodes 4. Aortic Atherosclerosis (ICD10-I70.0). Coronary artery calcifications. Electronically Signed   By: Signa Kell M.D.   On: 01/31/2023 13:23   PERIPHERAL VASCULAR CATHETERIZATION  Result Date: 01/29/2023  See surgical note for result.  DG Chest Port 1 View  Result Date: 01/27/2023 CLINICAL DATA:  Hypoxia EXAM: PORTABLE CHEST 1 VIEW COMPARISON:  01/25/2023 a CT from 01/23/2023 FINDINGS: Cardiac shadow is stable.  Lungs are well aerated bilaterally. Bilateral upper lobe infiltrates are seen right greater than left slightly increased when compared with the prior exam. Linear atelectatic changes are noted along the left cardiac border inferiorly. Some nodularity is seen as well particularly in the left lung. These changes are similar to that seen on prior CT examination. IMPRESSION: Increasing upper lobe consolidation bilaterally. Nodularity similar to that seen on recent CT examination. Electronically Signed   By: Alcide Clever M.D.   On: 01/27/2023 17:25   ECHOCARDIOGRAM COMPLETE  Result Date: 01/26/2023    ECHOCARDIOGRAM REPORT   Patient Name:   KIANNE CHRISTODOULOU Date of Exam: 01/26/2023 Medical Rec #:  [id number]       Height:       60.0 in Accession #:    [id number]      Weight:       127.6 lb Date of Birth:  Dec 15, 1940       BSA:          1.542 m Patient Age:    81 years        BP:           127/56 mmHg Patient Gender: F               HR:           94 bpm. Exam Location:  [facility] Procedure: 2D Echo, Cardiac Doppler and Color Doppler Indications:     Stroke I63.9  History:         Patient has no prior history of Echocardiogram examinations.                  Risk Factors:Hypertension. Tobacco abuse, breast cancer.  Sonographer:     Cristela Blue Referring Phys:  [id number] Earna Coder Diagnosing Phys: Marcina Millard MD  Sonographer Comments: Technically challenging study due to limited acoustic windows, no apical window and no subcostal window. IMPRESSIONS  1. Left ventricular ejection fraction, by estimation, is 55 to 60%. The left ventricle has normal function. The left ventricle has no regional wall motion abnormalities. Left ventricular diastolic parameters are indeterminate.  2. Right ventricular systolic function is normal. The right ventricular size is normal.  3. The mitral valve is normal in structure. Mild mitral valve regurgitation. No evidence of mitral stenosis.  4. The aortic valve is normal in structure.  Aortic valve regurgitation is not visualized. No aortic stenosis is present.  5. The inferior vena cava is normal in size with greater than 50% respiratory variability, suggesting right atrial pressure of 3 mmHg. FINDINGS  Left Ventricle: Left ventricular ejection fraction, by estimation, is 55 to 60%. The left ventricle has normal function. The left ventricle has no regional wall motion abnormalities. The left ventricular internal cavity size was normal in size. There is  no left ventricular hypertrophy. Left ventricular diastolic parameters are indeterminate. Right Ventricle: The right ventricular size is normal. No increase in right ventricular wall thickness. Right ventricular systolic function is normal. Left Atrium: Left atrial size was normal in size. Right Atrium: Right atrial size was normal in size. Pericardium: There is no evidence of pericardial effusion. Mitral Valve: The mitral valve is normal in structure. Mild mitral valve regurgitation. No evidence of mitral valve stenosis. Tricuspid Valve: The tricuspid valve is normal  in structure. Tricuspid valve regurgitation is mild . No evidence of tricuspid stenosis. Aortic Valve: The aortic valve is normal in structure. Aortic valve regurgitation is not visualized. No aortic stenosis is present. Pulmonic Valve: The pulmonic valve was normal in structure. Pulmonic valve regurgitation is not visualized. No evidence of pulmonic stenosis. Aorta: The aortic root is normal in size and structure. Venous: The inferior vena cava is normal in size with greater than 50% respiratory variability, suggesting right atrial pressure of 3 mmHg. IAS/Shunts: No atrial level shunt detected by color flow Doppler.  LEFT VENTRICLE PLAX 2D LVIDd:         3.80 cm LVIDs:         2.70 cm LV PW:         1.20 cm LV IVS:        1.20 cm LVOT diam:     2.00 cm LVOT Area:     3.14 cm  LEFT ATRIUM         Index LA diam:    3.40 cm 2.20 cm/m   AORTA Ao Root diam: 2.60 cm  SHUNTS Systemic  Diam: 2.00 cm Marcina Millard MD Electronically signed by Marcina Millard MD Signature Date/Time: 01/26/2023/1:03:43 PM    Final    DG Chest Port 1 View  Result Date: 01/25/2023 CLINICAL DATA:  Tachycardia EXAM: PORTABLE CHEST 1 VIEW COMPARISON:  Yesterday FINDINGS: Airspace disease at the upper lobes greater on the right. Haziness at the right base is from pleural fluid by CT. Normal heart size. Artifact from EKG leads. IMPRESSION: Biapical pneumonia that is worse on the right, stable from prior. Electronically Signed   By: Tiburcio Pea M.D.   On: 01/25/2023 05:03   US THORACENTESIS ASP PLEURAL SPACE W/IMG GUIDE  Result Date: 01/24/2023 INDICATION: Parapneumonic pleural effusion EXAM: ULTRASOUND GUIDED RIGHT THORACENTESIS MEDICATIONS: None. COMPLICATIONS: None immediate. PROCEDURE: An ultrasound guided thoracentesis was thoroughly discussed with the patient and questions answered. The benefits, risks, alternatives and complications were also discussed. The patient understands and wishes to proceed with the procedure. Written consent was obtained. Ultrasound was performed to localize and mark an adequate pocket of fluid in the right chest. The area was then prepped and draped in the normal sterile fashion. 1% Lidocaine was used for local anesthesia. Under ultrasound guidance a 19 gauge, 7-cm, Yueh catheter was introduced. Thoracentesis was performed. The catheter was removed and a dressing applied. FINDINGS: A total of approximately 330 ml of clear yellow fluid was removed. Samples were sent to the laboratory as requested by the clinical team. IMPRESSION: Successful ultrasound guided right thoracentesis yielding 330 mL of pleural fluid. Electronically Signed   By: Olive Bass M.D.   On: 01/24/2023 14:23   DG Chest Port 1 View  Result Date: 01/24/2023 CLINICAL DATA:  Pleural effusion post right thoracentesis. EXAM: PORTABLE CHEST 1 VIEW COMPARISON:  01/23/2023 FINDINGS: Heart size and pulmonary  vascularity are normal. No significant pleural effusion or pneumothorax identified. Bilateral upper lobe airspace consolidation with volume loss. Changes may represent pneumonia or postobstructive process. Left upper lung infiltrates are progressing since the previous study. Surgical clips in the right upper quadrant. IMPRESSION: 1. No significant pleural effusion or pneumothorax identified. 2. Bilateral upper lobe airspace consolidation, progressing on the left since prior study. Electronically Signed   By: Burman Nieves M.D.   On: 01/24/2023 12:05   CT Chest W Contrast  Result Date: 01/23/2023 CLINICAL DATA:  Sepsis; stage IV lung malignancy; * Tracking Code: BO * EXAM:  CT CHEST WITH CONTRAST TECHNIQUE: Multidetector CT imaging of the chest was performed during intravenous contrast administration. RADIATION DOSE REDUCTION: This exam was performed according to the departmental dose-optimization program which includes automated exposure control, adjustment of the mA and/or kV according to patient size and/or use of iterative reconstruction technique. CONTRAST:  75mL OMNIPAQUE IOHEXOL 300 MG/ML  SOLN COMPARISON:  PET-CT dated Feb 03, 2023 FINDINGS: Cardiovascular: Normal heart size. No pericardial effusion. Normal caliber thoracic aorta with severe atherosclerotic disease. Mediastinum/Nodes: Small to moderate hiatal hernia. Enlarged mediastinal and left supraclavicular lymph nodes, unchanged when compared with the prior exam. Reference AP window lymph node measuring 2.1 x 1.8 cm on series 2, image 48, unchanged when compared with the prior exam. Lungs/Pleura: Central airways are patent. Similar left apical mass with associated pleural thickening. New large right upper lobe consolidation and left upper lobe ground-glass opacities. New multiple solid pulmonary nodules. Reference new solid pulmonary nodule of the left lower lobe measuring 1.7 x 1.3 cm. Similar nodular ground-glass opacities of the left upper lobe.  New small right-greater-than-left pleural effusions and bibasilar atelectasis. 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 Prior cholecystectomy. Partially visualized simple appearing cyst of the right kidney. Musculoskeletal: Old anterolateral right-sided rib fractures. No aggressive appearing osseous lesions. IMPRESSION: 1. New large right upper lobe consolidation and left upper lobe ground-glass opacities, findings are likely due to pneumonia. 2. New small right-greater-than-left pleural effusions and bibasilar atelectasis. 3. Similar left apical mass with associated pleural thickening. 4. Multiple new large solid pulmonary nodules, concerning for progressive metastatic disease, although findings could also be infectious. Recommend short-term follow-up 1-2 months for further evaluation. 5. Stable mediastinal and left supraclavicular lymphadenopathy. 6. Aortic Atherosclerosis (ICD10-I70.0). Electronically Signed   By: Allegra Lai M.D.   On: 01/23/2023 16:03   DG Chest Portable 1 View  Result Date: 01/23/2023 CLINICAL DATA:  Fever EXAM: PORTABLE CHEST 1 VIEW COMPARISON:  Radiograph 01/13/2023 FINDINGS: Unchanged cardiomediastinal silhouette. There is confluent right upper lobe airspace disease and patchy airspace opacities in the mid to lower lungs. Trace pleural effusions. Unchanged left apical mass and pleural thickening. No evidence of pneumothorax. No acute osseous abnormality. IMPRESSION: Confluent right upper lobe airspace disease and patchy airspace opacities in the mid to lower lungs, concerning for multifocal pneumonia. Trace pleural effusions. Unchanged left apical mass and pleural thickening. Electronically Signed   By: Caprice Renshaw M.D.   On: 01/23/2023 13:20   DG Chest 2 View  Result Date: 01/13/2023 CLINICAL DATA:  Hypotensive, immunocompromise. 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 There are likely small bilateral pleural effusions. There is no focal lung infiltrate. The cardiomediastinal silhouette is within normal limits. Bones are osteopenic. No acute fractures are seen. There surgical clips in the right abdomen. IMPRESSION: 1. Small bilateral pleural effusions. 2. 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 Electronically Signed   By: Darliss Cheney M.D.   On: 01/13/2023 18:29    PERFORMANCE STATUS (ECOG) : 3 - Symptomatic, >50% confined to bed  Review of Systems Unable to complete  Physical Exam General: Frail-appearing Pulmonary: Congested cough, on O2 Extremities: no edema, no joint deformities Skin: no rashes Neurological: Weakness but otherwise nonfocal  IMPRESSION: Patient is status post coil embolization on 5/13 for recurrent GI bleed.  She has been slow to improve with pneumonia.  I met with patient's son to discuss goals.  He states that he  understands that patient is frail and clinically tenuous.  He recognizes the severity of her underlying health conditions.  He does remain in agreement with current scope of treatment and would like to give her time to see if she improves.  We did discuss the possible options of transition to comfort or consideration of hospice in the event of decline.  Son is most concerned about patient's poor oral intake.  He had questions about alternative methods of feeding.  I do not feel that she would be a good candidate for artificial nutrition and explained that to him.  PLAN: -Continue current scope of treatment -Will follow  Case and plan discussed with Dr. Donneta Romberg  Time Total: 20 minutes  Visit consisted of counseling and education dealing with the complex and emotionally intense issues of symptom management and palliative care in the setting of serious and potentially life-threatening illness.Greater than 50%  of this time was spent counseling and coordinating care related to the above  assessment and plan.  Signed by: Laurette Schimke, PhD, NP-C

## 2023-02-02 NOTE — Progress Notes (Addendum)
NAME:  Crystal Mcmahon, MRN:  [id number], DOB:  November 07, 1940, LOS: 10 ADMISSION DATE:  01/23/2023, INITIAL CONSULTATION DATE: 01/23/2023 FOLLOW-UP REQUESTED: 02/01/2023 REFERRING MD: Dr. 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  She presented to [facility] ER on 05/7 with fevers, tachycardia, and hypoxia.  At baseline pt wears 2L O2 via nasal canula.  She has been followed by oncology and received her first dose of keytruda on 01/22/23.  She was recently taken off her antihypertensives due to hypotension and s/p dex taper.  She endorses poor po intake due to decreased appetite.    Oncology hx: Primary cancer of left upper lobe of lung  12/22/2022 Initial Diagnosis    Primary cancer of left upper lobe of lung    12/22/2022 Cancer Staging    Staging form: Lung, AJCC 8th Edition - Clinical: Stage IVB (cT2, cN2, cM1c) - Signed by Earna Coder, MD on 12/22/2022    01/19/2023 -  Chemotherapy    Patient is on Treatment Plan : LUNG NSCLC Pembrolizumab (200) q21d      ED Course Upon arrival to the ER pts O2 sats were 84% on baseline O2@2L  via nasal canula requiring increase in O2 to 4L with improvement in oxygenation.  ER vital signs were: temp 101.1 F/sbp 98/hr 118/resp rate 15/O2 sats 96% on 4L.  ER lab results: BUN 29/creatinine 1.10/calcium 8.6/albumin 1.9/AST 44/troponin 30/pct 0.83/hgb 8.8.  CXR concerning for pneumonia with trace pleural effusions and unchanged left apical mass.  Sepsis protocol initiated and pt received 1,750 ml iv fluid bolus, metronidazole, vancomycin, and cefepime.  Pt remained hypotensive requiring low dose levophed gtt.  PCCM team contacted for ICU admission.   Hospital course thus far: Patient was transferred to the hospitalist service for ongoing management of  her pneumonia and pleural effusions.  Her hospital course has been complicated by upper GI bleed necessitating EGD on 25 Jan 2023 showing gastric ulcers, esophagitis and a hiatal hernia.  She then had to undergo embolization by vascular surgery due to ongoing bleeding from a duodenal ulcer.  She underwent a CT yesterday, 15 May, for follow-up of her pneumonia.  This showed extensive airspace disease within the left upper lobe and left apex and same on the right upper lobe.  Multiple pulmonary nodules.  Some areas of cavitation.  She has required conscious sedation on a number of these procedures.   We are asked to assess the patient for potential need for bronchoscopy.  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 New small          right-greater-than-left pleural effusions and bibasilar atelectasis. Similar left apical mass        with associated  pleural thickening. Multiple new large solid pulmonary nodules,        concerning for progressive metastatic disease, although findings could also be infectious.        Recommend short-term follow-up 1-2 months for further evaluation. Stable mediastinal         and left supraclavicular lymphadenopathy. Aortic Atherosclerosis (ICD10-I70.0) 05/08: Right thoracentesis 05/09: EGD for upper GI bleed 05/13: Embolization due to persistent duodenal ulcer bleed performed by vascular  surgery 05/15: CT chest: Interval progression of dense airspace consolidation within the right upper lobe, new dense airspace consolidation within the left upper lobe, worsening multifocal pneumonia suspected.  Interval cavitation of previously noted lung nodules query septic emboli, enlarged upper abdominal lymph nodes 05/17: Mostly bedridden, lethargic.  Micro Data:  COVID-19 05/07>>negative  Influenza A&B 05/07>>negative  RSV 05/07>>negative  Blood x2 05/07>> no growth Sputum 05/16>>  Anti-infectives (From admission, onward)    Start     Dose/Rate Route Frequency Ordered Stop   02/01/23 2200  piperacillin-tazobactam (ZOSYN) IVPB 3.375 g        3.375 g 12.5 mL/hr over 240 Minutes Intravenous Every 8 hours 02/01/23 1503     01/31/23 2100  piperacillin-tazobactam (ZOSYN) IVPB 4.5 g  Status:  Discontinued        4.5 g 25 mL/hr over 240 Minutes Intravenous Every 8 hours 01/31/23 1658 02/01/23 1503   01/29/23 1251  ceFAZolin (ANCEF) IVPB 2g/100 mL premix        2 g 200 mL/hr over 30 Minutes Intravenous 30 min pre-op 01/29/23 1252 01/29/23 1405   01/28/23 1200  azithromycin (ZITHROMAX) 500 mg in sodium chloride 0.9 % 250 mL IVPB        500 mg 250 mL/hr over 60 Minutes Intravenous Every 24 hours 01/28/23 0904 01/30/23 1300   01/25/23 1300  vancomycin (VANCOREADY) IVPB 1250 mg/250 mL  Status:  Discontinued        1,250 mg 166.7 mL/hr over 90 Minutes Intravenous Every 48 hours 01/24/23 0115 01/24/23 1343   01/25/23 1230  erythromycin 250 mg in sodium chloride 0.9 % 100 mL IVPB        250 mg 100 mL/hr over 60 Minutes Intravenous NOW 01/25/23 1136 01/25/23 1831   01/24/23 1600  vancomycin (VANCOREADY) IVPB 750 mg/150 mL  Status:  Discontinued        750 mg 150 mL/hr over 60 Minutes Intravenous Every 24 hours 01/24/23 1343 01/26/23 1519   01/24/23 0145  vancomycin (VANCOREADY) IVPB 1250 mg/250 mL  Status:  Discontinued        1,250 mg 166.7 mL/hr over 90 Minutes Intravenous Every 24 hours  01/24/23 0045 01/24/23 0115   01/24/23 0100  ceFEPIme (MAXIPIME) 2 g in sodium chloride 0.9 % 100 mL IVPB  Status:  Discontinued        2 g 200 mL/hr over 30 Minutes Intravenous Every 12 hours 01/24/23 0045 01/30/23 1915   01/23/23 1230  ceFEPIme (MAXIPIME) 2 g in sodium chloride 0.9 % 100 mL IVPB        2 g 200 mL/hr over 30 Minutes Intravenous  Once 01/23/23 1220 01/23/23 1305   01/23/23 1230  metroNIDAZOLE (FLAGYL) IVPB 500 mg        500 mg 100 mL/hr over 60 Minutes Intravenous  Once 01/23/23 1220 01/23/23 1422   01/23/23 1230  vancomycin (VANCOCIN) IVPB 1000 mg/200 mL premix  Status:  Discontinued        1,000 mg 200 mL/hr over 60 Minutes Intravenous  Once 01/23/23  1220 01/23/23 1223   01/23/23 1230  vancomycin (VANCOREADY) IVPB 1250 mg/250 mL        1,250 mg 166.7 mL/hr over 90 Minutes Intravenous  Once 01/23/23 1223 01/23/23 1650      Interim History / Subjective:  Continues to have episodes of waxing and waning mentation.  Today more lethargic than yesterday.  Oxygen requirement still down to 1.5 L/min.  No tachypnea or respiratory distress.  Objective   Blood pressure 127/63, pulse 91, temperature 98.2 F (36.8 C), resp. rate 15, height 5' (1.524 m), weight 57.8 kg, SpO2 93 %.    SpO2: 93 % O2 Flow Rate (L/min): 1.5 L/min FiO2 (%): 32 %   Intake/Output Summary (Last 24 hours) at 02/02/2023 0920 Last data filed at 02/02/2023 1610 Gross per 24 hour  Intake 727.24 ml  Output --  Net 727.24 ml    Filed Weights   01/30/23 0500 02/01/23 0630 02/02/23 0428  Weight: 56.3 kg 57.4 kg 57.8 kg   Examination: General: Acute on chronically-ill female resting in bed, NAD on 1.5 L O2 via nasal canula  HENT: Supple, no JVD  Lungs: Faint rhonchi throughout, no wheezes, even, non labored  Cardiovascular: NSR, s1s2, rrr, no m/r/g, 2+ radial/2+ distal pulses, no edema  Abdomen: +BS x4, none distended, soft, non tender  Extremities: Significant sarcopenia, moves all extremities,  significant debility/frailty Neuro: Lethargic but oriented, following commands, PERRLA, significant psychomotor retardation GU: Has external urinary catheter in place Skin: Healed marks from generalized rash  Imaging Reviewed  Representative images from CT performed 31 Jan 2023, independently reviewed:        Assessment & Plan:  Multifocal necrotizing pneumonia Query aspiration Continue Zosyn  Pulmonary hygiene Sputum collected and pending  Acute on chronic hypoxic respiratory failure  Pneumonia  Pleural effusions  Stage IV lung cancer with mets to the bone Hx: Chronic O2 @2L  O2 and Current Everyday Smoker  - Continue supplemental O2, currently on 1.5 L and tolerating - Scheduled and prn bronchodilator therapy (Brovana/Yupelri/albuterol) - O2 requirements have decreased - Recommend palliative Care consultation  Protein calorie malnutrition  Cancer cachexia - Continue regular diet  - Dietitian consulted appreciate input   Acute encephalopathy Toxic/metabolic Consider MRI, question septic emboli Replete electrolytes as needed Continue supportive measures  Debility/frailty PT OT as tolerated This issue adds complexity to her management   Best Practice (right click and "Reselect all SmartList Selections" daily)   Diet/type: Regular consistency (see orders) DVT prophylaxis: LMWH GI prophylaxis: Protonix Lines: N/A Foley:  N/A, has external urinary catheter Code Status:  DNR Last date of multidisciplinary goals of care discussion [N/A]   Labs   CBC: Recent Labs  Lab 01/29/23 0429 01/29/23 1728 01/30/23 0442 01/31/23 0237 02/01/23 0712 02/01/23 1547 02/02/23 0440  WBC 9.4  --  10.3 8.7 8.0  --  7.7  HGB 7.4* 12.1 12.0 10.6* 10.3* 10.7* 9.9*  HCT 23.1* 35.8* 35.6* 32.1* 32.4*  --  31.1*  MCV 93.9  --  86.8 87.5 90.0  --  89.9  PLT 78*  --  75* 81* 103*  --  112*     Basic Metabolic Panel: Recent Labs  Lab 01/29/23 0429 01/30/23 0442  01/31/23 0237 02/01/23 0657 02/02/23 0440  NA 148* 150* 148* 147* 146*  K 3.0* 3.3* 3.6 3.4* 2.8*  CL 115* 121* 119* 114* 112*  CO2 25 22 23 23 25   GLUCOSE 94 113* 170* 85 80  BUN 36* 39* 36* 34* 32*  CREATININE 0.78  0.78 0.84 0.92 0.89  CALCIUM 6.5* 6.6* 6.1* 6.4* 6.4*  MG 1.9 2.7* 2.3 2.1 1.8  PHOS  --   --  1.4* 2.1* 2.7    GFR: Estimated Creatinine Clearance: 38.1 mL/min (by C-G formula based on SCr of 0.89 mg/dL). Recent Labs  Lab 01/30/23 0442 01/31/23 0237 02/01/23 0657 02/01/23 0712 02/02/23 0440  PROCALCITON  --   --  0.20  --   --   WBC 10.3 8.7  --  8.0 7.7     Liver Function Tests: Recent Labs  Lab 01/31/23 0237  AST 38  ALT 12  ALKPHOS 37*  BILITOT 0.8  PROT 4.2*  ALBUMIN 1.9*    No results for input(s): "LIPASE", "AMYLASE" in the last 168 hours. No results for input(s): "AMMONIA" in the last 168 hours.  ABG    Component Value Date/Time   HCO3 21.1 01/25/2023 0557   ACIDBASEDEF 3.0 (H) 01/25/2023 0557   O2SAT 34.3 01/25/2023 0557     Coagulation Profile: No results for input(s): "INR", "PROTIME" in the last 168 hours.   Cardiac Enzymes: No results for input(s): "CKTOTAL", "CKMB", "CKMBINDEX", "TROPONINI" in the last 168 hours.  HbA1C: No results found for: "HGBA1C"  CBG: Recent Labs  Lab 01/29/23 2327 01/31/23 0439  GLUCAP 113* 139*     Review of Systems  Patient too lethargic to participate in review of systems.  Allergies Allergies  Allergen Reactions   Acetaminophen-Pamabrom Rash and Swelling    Other reaction(s): Other (See Comments)  Other Reaction: RASH & SWELLING   Codeine Nausea And Vomiting   Midol Pm [Diphenhydramine-Apap (Sleep)] Swelling and Rash         Home Medications  Prior to Admission medications   Medication Sig Start Date End Date Taking? Authorizing Provider  levofloxacin (LEVAQUIN) 500 MG tablet Take 1 tablet (500 mg total) by mouth daily for 7 days. 01/23/23 01/30/23  Alinda Dooms, NP   acetaminophen (TYLENOL) 650 MG CR tablet Take 650 mg by mouth every 8 (eight) hours as needed for pain.    [provider]  amLODipine (NORVASC) 2.5 MG tablet Take 2.5 mg by mouth daily.  Patient not taking: Reported on 01/19/2023 01/14/16   [provider]  atorvastatin (LIPITOR) 20 MG tablet Take 20 mg by mouth daily.    [provider]  calcium carbonate (OSCAL) 1500 (600 CA) MG TABS tablet Take 600 mg of elemental calcium by mouth 2 (two) times daily with a meal.    [provider]  citalopram (CELEXA) 10 MG tablet Take 10 mg by mouth daily.    [provider]  gabapentin (NEURONTIN) 300 MG capsule Take 300 mg by mouth in the morning, at noon, and at bedtime. 11/27/22 12/27/22  [provider]  lansoprazole (PREVACID) 30 MG capsule Take 1 capsule (30 mg total) by mouth daily at 12 noon. 12/22/22   Carmina Miller, MD  meloxicam (MOBIC) 15 MG tablet Take 1 tablet (15 mg total) by mouth daily. 12/22/22   Earna Coder, MD  morphine (MSIR) 15 MG tablet 1/2 -1 pill every 8- 12 hours as needed. 01/19/23   Earna Coder, MD  ondansetron (ZOFRAN) 8 MG tablet One pill every 8 hours as needed for nausea/vomitting. 12/07/22   Earna Coder, MD  sucralfate (CARAFATE) 1 g tablet Take 1 tablet (1 g total) by mouth 3 (three) times daily. Dissolve tablet in 3to 4 tables spoons warm water swish and swallow 12/22/22   Chrystal, Sherrine Maples,  MD  triamterene-hydrochlorothiazide (DYAZIDE) 37.5-25 MG capsule Take 1 capsule by mouth daily.  Patient not taking: Reported on 01/19/2023 06/10/15   [provider]   Scheduled Meds:  sodium chloride   Intravenous Once   arformoterol  15 mcg Nebulization BID   vitamin C  500 mg Oral BID   Chlorhexidine Gluconate Cloth  6 each Topical QHS   citalopram  10 mg Oral Daily   feeding supplement  237 mL Oral TID BM   gabapentin  300 mg Oral TID   multivitamin with minerals  1 tablet Oral Daily   nystatin  5  mL Oral QID   pantoprazole (PROTONIX) IV  40 mg Intravenous Q12H   potassium chloride  40 mEq Oral BID   revefenacin  175 mcg Nebulization Daily   Continuous Infusions:  sodium chloride Stopped (01/28/23 0906)   calcium gluconate     piperacillin-tazobactam (ZOSYN)  IV 12.5 mL/hr at 02/02/23 0513   potassium chloride     sodium chloride     PRN Meds:.albuterol, docusate sodium, fentaNYL (SUBLIMAZE) injection, HYDROmorphone (DILAUDID) injection, morphine, ondansetron (ZOFRAN) IV, ondansetron (ZOFRAN) IV, polyethylene glycol    Total visit time 35 minutes    The patient again on Monday 5/20.  Please call in the interim with any questions or concerns from the pulmonary standpoint.  Discussed with Dr. Chipper Herb.   Gailen Shelter, MD Advanced Bronchoscopy PCCM [facility]-    *This note was dictated using voice recognition software/Dragon.  Despite best efforts to proofread, errors can occur which can change the meaning. Any transcriptional errors that result from this process are unintentional and may not be fully corrected at the time of dictation.

## 2023-02-03 DIAGNOSIS — R6521 Severe sepsis with septic shock: Secondary | ICD-10-CM | POA: Diagnosis not present

## 2023-02-03 DIAGNOSIS — J690 Pneumonitis due to inhalation of food and vomit: Secondary | ICD-10-CM | POA: Diagnosis not present

## 2023-02-03 DIAGNOSIS — A419 Sepsis, unspecified organism: Secondary | ICD-10-CM | POA: Diagnosis not present

## 2023-02-03 DIAGNOSIS — J9621 Acute and chronic respiratory failure with hypoxia: Secondary | ICD-10-CM | POA: Diagnosis not present

## 2023-02-03 LAB — CBC
HCT: 33.1 % — ABNORMAL LOW (ref 36.0–46.0)
Hemoglobin: 10.8 g/dL — ABNORMAL LOW (ref 12.0–15.0)
MCH: 29.5 pg (ref 26.0–34.0)
MCHC: 32.6 g/dL (ref 30.0–36.0)
MCV: 90.4 fL (ref 80.0–100.0)
Platelets: 139 10*3/uL — ABNORMAL LOW (ref 150–400)
RBC: 3.66 MIL/uL — ABNORMAL LOW (ref 3.87–5.11)
RDW: 18.3 % — ABNORMAL HIGH (ref 11.5–15.5)
WBC: 7.4 10*3/uL (ref 4.0–10.5)
nRBC: 0 % (ref 0.0–0.2)

## 2023-02-03 LAB — BASIC METABOLIC PANEL WITH GFR
Anion gap: 7 (ref 5–15)
BUN: 29 mg/dL — ABNORMAL HIGH (ref 8–23)
CO2: 26 mmol/L (ref 22–32)
Calcium: 7 mg/dL — ABNORMAL LOW (ref 8.9–10.3)
Chloride: 111 mmol/L (ref 98–111)
Creatinine, Ser: 0.89 mg/dL (ref 0.44–1.00)
GFR, Estimated: >60 mL/min
Glucose, Bld: 74 mg/dL (ref 70–99)
Potassium: 3.4 mmol/L — ABNORMAL LOW (ref 3.5–5.1)
Sodium: 144 mmol/L (ref 135–145)

## 2023-02-03 LAB — PHOSPHORUS: Phosphorus: 3 mg/dL (ref 2.5–4.6)

## 2023-02-03 LAB — MAGNESIUM: Magnesium: 1.8 mg/dL (ref 1.7–2.4)

## 2023-02-03 MED ORDER — POTASSIUM CHLORIDE CRYS ER 20 MEQ PO TBCR
40.0000 meq | EXTENDED_RELEASE_TABLET | Freq: Two times a day (BID) | ORAL | Status: AC
  Administered 2023-02-03 (×2): 40 meq via ORAL
  Filled 2023-02-03 (×2): qty 2

## 2023-02-03 MED ORDER — CITALOPRAM HYDROBROMIDE 20 MG PO TABS
20.0000 mg | ORAL_TABLET | Freq: Every day | ORAL | Status: DC
  Administered 2023-02-03 – 2023-02-07 (×5): 20 mg via ORAL
  Filled 2023-02-03 (×5): qty 1

## 2023-02-03 NOTE — Progress Notes (Addendum)
Speech Language Pathology Treatment: Dysphagia  Patient Details Name: Crystal Mcmahon MRN: [id number] DOB: Jul 21, 1941 Today's Date: 02/03/2023 Time: 0935-1010 SLP Time Calculation (min) (ACUTE ONLY): 35 min  Assessment / Plan / Recommendation Clinical Impression  Pt seen today for ongoing assessment of swallowing, trials to upgrade diet if appropriate. Pt appeared fatigued and weak but opened eyes and agreed to po trials. Family member present and supported feeding pt the po's given at this session. Family member had gold fish for pt. Pt is on HOSPICE services per NSG/chart. Pt on [place] O2 support 1.5L; afebrile. WBC WNL.   Pt appears to present w/ functional oropharyngeal phase swallowing w/ No overt oropharyngeal phase dysphagia noted, No neuromuscular deficits noted. Pt consumed po trials w/ No immediate, overt clinical s/s of aspiration during po trials.  Pt appears at reduced risk for aspiration following general aspiration precautions and when given support during oral intake for conservation of energy.  However, pt does have challenging factors that could impact her oropharyngeal swallowing to include fatigue/weakness/deconditioning, Stage 4 Cancer to the Bone, cervical mets, and need for support w/ self-feeding d/t weakness. These factors can increase risk for dysphagia, aspiration as well as decreased oral intake overall.   During po trials, pt consumed all consistencies w/ no overt coughing, decline in vocal quality, or change in respiratory presentation during/post trials. O2 sats remained 98% when checked. Oral phase appeared grossly [facility] w/ timely bolus management, mastication, and control of bolus propulsion for A-P transfer for swallowing. Oral clearing achieved w/ all trial consistencies -- attempted moistening foods.   Recommend a more mech soft/Regular consistency diet w/ well-Cut meats, moistened foods and NO Salads currently -- diet consistency recommended for Pleasure and for  Options available in setting of decreased appetite/Cancer; Family/Staff will assist in cutting tougher foods(Family agreed). Thin liquids -- carefully monitor straw use, and pt should help to Hold Cup when drinking. Recommend general aspiration precautions, Pills WHOLE in Puree for safer, easier swallowing -- pt has been doing this w/ NSG, and it was encourged now and for D/C to the Family member.  Education given on Pills in Puree; food consistencies and easy to eat options/prep; general aspiration precautions to pt and Family member. No further skilled ST services indicated as pt appears to be adequately tolerating an oral diet w/out decline in status. NSG to reconsult if any new swallowing needs arise. NSG updated, agreed. MD updated. Ongoing HOSPICE services for support at home. Recommend Dietician f/u for support.      HPI HPI: Per H&P: 82 y.o. 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 She presented to [facility] ER on 05/7 with fevers, tachycardia, and hypoxia. At baseline pt wears 2L O2 via nasal canula. She has been followed by oncology and received her first dose of keytruda on 01/22/23. She was recently taken off her antihypertensives due to hypotension and s/p dex taper. Pt currently on Dys 2 and thin liquids. Pt on baseline 2L O2. CT Chest 01/31/23: IMPRESSION: Interval progression of dense airspace consolidation within the right upper lobe. New dense airspace consolidation within the left upper lobe compared with 01/23/2023. Imaging findings are concerning for worsening multi focal pneumonia. Interval cavitation of previously noted lung nodules which in the setting of septicemia may reflect areas of septic emboli. Metastatic disease is considered less favored but would be difficult to exclude with a high degree of certainty. Follow-up  imaging recommended to ensure resolution. Similar appearance of enlarged upper  abdominal lymph nodes Aortic Atherosclerosis (ICD10-I70.0). Coronary artery calcifications. CXR 01/31/23: Mild worsening of bilateral upper lobe consolidation consistent with pneumonia.      SLP Plan  All goals met      Recommendations for follow up therapy are one component of a multi-disciplinary discharge planning process, led by the attending physician.  Recommendations may be updated based on patient status, additional functional criteria and insurance authorization.    Recommendations  Diet recommendations: Regular;Thin liquid (chopped meats/foods as needed; pleasure foods) Liquids provided via: Cup;Straw (monitor) Medication Administration: Whole meds with puree (as needed for ease/safety of swallowing per NSG; ed given to family member) Supervision: Staff to assist with self feeding;Full supervision/cueing for compensatory strategies Compensations: Minimize environmental distractions;Slow rate;Small sips/bites;Follow solids with liquid Postural Changes and/or Swallow Maneuvers: Out of bed for meals;Seated upright 90 degrees;Upright 30-60 min after meal                 (Dietician; pt is on Hospice services per NSG) Oral care BID;Oral care before and after PO;Staff/trained caregiver to provide oral care   Frequent or constant Supervision/Assistance (for support) Dysphagia, unspecified (R13.10)     All goals met       Jerilynn Som, MS, CCC-SLP Speech Language Pathologist Rehab Services; [facility] [place] [phone] (ascom) Breon Diss  02/03/2023, 1:00 PM

## 2023-02-03 NOTE — Plan of Care (Signed)

## 2023-02-03 NOTE — Progress Notes (Signed)
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  She presented to [facility] ER on 05/7 with fevers, tachycardia, and hypoxia.  At baseline pt wears 2L O2 via nasal canula.  She has been followed by oncology and received her first dose of keytruda on 01/22/23.  She was recently taken off her antihypertensives due to hypotension and s/p dex taper.     Upon arrival to the ER pts O2 sats were 84% on baseline O2@2L  via nasal canula requiring increase in O2 to 4L with improvement in oxygenation.  CXR concerning for pneumonia with trace pleural effusions and unchanged left apical mass.  Sepsis protocol initiated and pt received 1,750 ml iv fluid bolus, metronidazole, vancomycin, and cefepime.  Pt remained hypotensive requiring low dose levophed gtt.  PCCM team contacted for ICU admission.  Pt was off pressor and transferred to hospitalist service on 01/24/23. Patient condition does not seem to be improving, CT chest performed on 5/15 showed bilateral upper lobe worsening pneumonia with cavitary lesion, consistent with aspiration pneumonia.  Zosyn was started.   Principal Problem:   Septic shock (HCC) Active Problems:   Stage 4 lung cancer (HCC)   Pneumonia of right upper lobe due to infectious organism   Palliative care encounter   Pressure injury of skin   Malnutrition of moderate degree   Gastrointestinal hemorrhage   Hypernatremia   Hypophosphatemia   Thrombocytopenia (HCC)   Duodenal ulcer with hemorrhage   Acute on chronic respiratory failure with hypoxia (HCC)   Aspiration pneumonia (HCC)   Assessment and Plan: #Septic and hypovolemic shock  #Mildly elevated troponin likely secondary to demand ischemia in the setting of sepsis Bilateral  upper lobe pneumonia, likely aspiration pneumonia. Patient condition appears to be improving, no longer has any hypotension.  Repeated chest images static with chest CT scan on 5/15 showed bilateral upper lobe pneumonia, with some cavitary lesion.  Discussed with pulmonology, this most likely aspiration pneumonia with Pseudomonas risk.  Started on Zosyn, will change to oral antibiotics with Pseudomonas coverage at discharge for total course of 21 days.  Echocardiogram will be repeated to make sure patient does not have valvular involvement. Echocardiogram was performed, ejection fraction 60 to 65%, no vegetation observed. Sputum culture was also sent out, pending results. MRI of the brain did not show any septic emboli versus metastasis. Patient condition appears to be improving with antibiotics.  Will continue.   # AKI secondary to septic shock.  Ruled in. Hypernatremia. Hypokalemia. Hypophosphatemia. Sodium level has normalized, continue replete potassium.     # acute blood loss anemia #Iron deficiency anemia  Thrombocytopenia.  # Upper GI bleed 2/2 duodenal ulcer # gastritis --coffee ground emesis and large melanotic stool on 5/9 --EGD 5/9 found large ulcer just past the pylorus in the duodenum. Another ulcer in the pylorus. The culprit lesion was the duodenal ulcer which was treated.  --found re-bleeding morning of 5/13, vascular performed coil embolization of the pancreaticcoduodenal artery and gastroduodenal artery. Patient still on PPI IV twice a day, hemoglobin seem to be better. Patient had a episode of black stool 5/16, discussed with Dr. Timothy Lasso, patient is status post embolization for GI bleed, some black stool is expected.  Continue  monitor hemoglobin.  Continue PPI. Hemoglobin has been better.  #Acute on chronic hypoxic respiratory failure  Chronic O2 @2L  O2 and Current Everyday Smoker --2/2 PNA in the setting of lung cancer --increased O2 requirement on 5/11 to 7L.  Did get  IVF in the past days.  O2 down to 4L next day after diuresis.  Though should consider pembro induced lung toxicity at this point, per pulm. Oxygenation has back to baseline.   #Right pleural effusion 2/2 parapneumonic effusion --thoracentesis on 5/8, with 330 ml removed Lab test from the thoracentesis showed exudates, but the majority of the white cells are lymphocytes, not infectious.  Cytology did not see any malignant cells.   #Stage IV lung cancer with mets to the bone Chest CT scan did not show worsening lung cancer. Obtain MRI of the brain to make sure patient does not have brain mets.   Non-severe (moderate) malnutrition in context of chronic illness  --supplements per dietician          Subjective:  Patient seems to be better with appetite, more awake today.  Physical Exam: Vitals:   02/03/23 0500 02/03/23 0500 02/03/23 0732 02/03/23 0801  BP:  138/72 136/74   Pulse:  88 73   Resp:  20 18   Temp:  98.2 F (36.8 C) 98.4 F (36.9 C)   TempSrc:  Oral    SpO2:  96% 95% 92%  Weight: 56.6 kg     Height:       General exam: Appears calm and comfortable  Respiratory system: Clear to auscultation. Respiratory effort normal. Cardiovascular system: S1 & S2 heard, RRR. No JVD, murmurs, rubs, gallops or clicks. No pedal edema. Gastrointestinal system: Abdomen is nondistended, soft and nontender. No organomegaly or masses felt. Normal bowel sounds heard. Central nervous system: Alert and oriented x2. No focal neurological deficits. Extremities: Symmetric 5 x 5 power. Skin: No rashes, lesions or ulcers Psychiatry: Mood & affect appropriate.    Data Reviewed:  Lab results reviewed.  Family Communication: Son updated at bedside  Disposition: Status is: Inpatient Remains inpatient appropriate because: Severity of disease, IV treatment.     Time spent: 35 minutes  Author: Marrion Coy, MD 02/03/2023 2:02 PM  For on call review [email or web address].

## 2023-02-04 DIAGNOSIS — R6521 Severe sepsis with septic shock: Secondary | ICD-10-CM | POA: Diagnosis not present

## 2023-02-04 DIAGNOSIS — A419 Sepsis, unspecified organism: Secondary | ICD-10-CM | POA: Diagnosis not present

## 2023-02-04 DIAGNOSIS — J690 Pneumonitis due to inhalation of food and vomit: Secondary | ICD-10-CM | POA: Diagnosis not present

## 2023-02-04 DIAGNOSIS — J9621 Acute and chronic respiratory failure with hypoxia: Secondary | ICD-10-CM | POA: Diagnosis not present

## 2023-02-04 LAB — CBC
HCT: 32.9 % — ABNORMAL LOW (ref 36.0–46.0)
Hemoglobin: 10.4 g/dL — ABNORMAL LOW (ref 12.0–15.0)
MCH: 28.9 pg (ref 26.0–34.0)
MCHC: 31.6 g/dL (ref 30.0–36.0)
MCV: 91.4 fL (ref 80.0–100.0)
Platelets: 147 10*3/uL — ABNORMAL LOW (ref 150–400)
RBC: 3.6 MIL/uL — ABNORMAL LOW (ref 3.87–5.11)
RDW: 18.3 % — ABNORMAL HIGH (ref 11.5–15.5)
WBC: 7.1 10*3/uL (ref 4.0–10.5)
nRBC: 0 % (ref 0.0–0.2)

## 2023-02-04 LAB — BASIC METABOLIC PANEL
Anion gap: 7 (ref 5–15)
BUN: 30 mg/dL — ABNORMAL HIGH (ref 8–23)
CO2: 25 mmol/L (ref 22–32)
Calcium: 7.3 mg/dL — ABNORMAL LOW (ref 8.9–10.3)
Chloride: 114 mmol/L — ABNORMAL HIGH (ref 98–111)
Creatinine, Ser: 0.93 mg/dL (ref 0.44–1.00)
GFR, Estimated: >60 mL/min (ref >60)
Glucose, Bld: 106 mg/dL — ABNORMAL HIGH (ref 70–99)
Potassium: 3.7 mmol/L (ref 3.5–5.1)
Sodium: 146 mmol/L — ABNORMAL HIGH (ref 135–145)

## 2023-02-04 LAB — CULTURE, RESPIRATORY W GRAM STAIN

## 2023-02-04 MED ORDER — METOLAZONE 2.5 MG PO TABS
2.5000 mg | ORAL_TABLET | Freq: Once | ORAL | Status: AC
  Administered 2023-02-04: 2.5 mg via ORAL
  Filled 2023-02-04: qty 1

## 2023-02-04 NOTE — Progress Notes (Addendum)
Mobility Specialist - Progress Note     02/04/23 1330  Mobility  Activity Transferred from bed to chair  Level of Assistance Maximum assist, patient does 25-49%  Assistive Device None  Range of Motion/Exercises Active  Activity Response Tolerated well  Mobility Referral Yes  $Mobility charge 1 Mobility  Mobility Specialist Start Time (ACUTE ONLY) 1251  Mobility Specialist Stop Time (ACUTE ONLY) 1331  Mobility Specialist Time Calculation (min) (ACUTE ONLY) 40 min   Pt sleeping in bed on 1.5 L upon entry. Pt was very drowse but, responded to requests to "tell me how she is feeling, open her eyes, and to move her leg". Pt transferred to chair from bed max assist. RN notified of session, pt left with needs in reach and bed alarm activated.   Johnathan Hausen Mobility Specialist 02/04/23, 1:44 PM

## 2023-02-04 NOTE — Progress Notes (Signed)
Progress Note   Patient: Crystal Mcmahon ZOX:[id number] DOB: [birth year] DOA: 01/23/2023     12 DOS: the patient was seen and examined on 02/04/2023   Brief hospital course: 82 yo female with stage 4 lung cancer with mets to the bone s/p neck radiation due to cervical mets (dx November 21, 2022) to the left neck.  She presented to [facility] ER on 05/7 with fevers, tachycardia, and hypoxia.  At baseline pt wears 2L O2 via nasal canula.  She has been followed by oncology and received her first dose of keytruda on 01/22/23.  She was recently taken off her antihypertensives due to hypotension and s/p dex taper.     Upon arrival to the ER pts O2 sats were 84% on baseline O2@2L  via nasal canula requiring increase in O2 to 4L with improvement in oxygenation.  CXR concerning for pneumonia with trace pleural effusions and unchanged left apical mass.  Sepsis protocol initiated and pt received 1,750 ml iv fluid bolus, metronidazole, vancomycin, and cefepime.  Pt remained hypotensive requiring low dose levophed gtt.  PCCM team contacted for ICU admission.  Pt was off pressor and transferred to hospitalist service on 01/24/23. Patient condition does not seem to be improving, CT chest performed on 5/15 showed bilateral upper lobe worsening pneumonia with cavitary lesion, consistent with aspiration pneumonia.  Zosyn was started.   Principal Problem:   Septic shock (HCC) Active Problems:   Stage 4 lung cancer (HCC)   Pneumonia of right upper lobe due to infectious organism   Palliative care encounter   Pressure injury of skin   Malnutrition of moderate degree   Gastrointestinal hemorrhage   Hypernatremia   Hypophosphatemia   Thrombocytopenia (HCC)   Duodenal ulcer with hemorrhage   Acute on chronic respiratory failure with hypoxia (HCC)   Aspiration pneumonia (HCC)   Assessment and Plan: #Septic and hypovolemic shock  #Mildly elevated troponin likely secondary to demand ischemia in the setting of sepsis Bilateral  upper lobe pneumonia, likely aspiration pneumonia. Patient condition appears to be improving, no longer has any hypotension.  Repeated chest images static with chest CT scan on 5/15 showed bilateral upper lobe pneumonia, with some cavitary lesion.  Discussed with pulmonology, this most likely aspiration pneumonia with Pseudomonas risk.  Started on Zosyn, will change to oral antibiotics with Pseudomonas coverage at discharge for total course of 21 days.  Echocardiogram will be repeated to make sure patient does not have valvular involvement. Echocardiogram was performed, ejection fraction 60 to 65%, no vegetation observed. Sputum culture was also sent out, pending results. MRI of the brain did not show any septic emboli versus metastasis. Sputum culture still pending, continue Zosyn for now. Patient condition appears to be improving, she has a better appetite, she is more awake.  She may be able to transfer to nursing home in a few days.   # AKI secondary to septic shock.  Ruled in. Hypernatremia. Hypokalemia. Hypophosphatemia. Conditions mostly improved.  Sodium level still higher, give her lower dose of metolazone today.     # acute blood loss anemia #Iron deficiency anemia  Thrombocytopenia.  # Upper GI bleed 2/2 duodenal ulcer # gastritis --coffee ground emesis and large melanotic stool on 5/9 --EGD 5/9 found large ulcer just past the pylorus in the duodenum. Another ulcer in the pylorus. The culprit lesion was the duodenal ulcer which was treated.  --found re-bleeding morning of 5/13, vascular performed coil embolization of the pancreaticcoduodenal artery and gastroduodenal artery. Patient still on PPI IV twice  a day, hemoglobin seem to be better. Patient had a episode of black stool 5/16, discussed with Dr. Timothy Lasso, patient is status post embolization for GI bleed, some black stool is expected.  Continue monitor hemoglobin.  Continue PPI. No additional black stool.   #Acute on chronic  hypoxic respiratory failure  Chronic O2 @2L  O2 and Current Everyday Smoker --2/2 PNA in the setting of lung cancer --increased O2 requirement on 5/11 to 7L.  Did get IVF in the past days.  O2 down to 4L next day after diuresis.  Though should consider pembro induced lung toxicity at this point, per pulm. Oxygenation has back to baseline.   #Right pleural effusion 2/2 parapneumonic effusion --thoracentesis on 5/8, with 330 ml removed Lab test from the thoracentesis showed exudates, but the majority of the white cells are lymphocytes, not infectious.  Cytology did not see any malignant cells.   #Stage IV lung cancer with mets to the bone Chest CT scan did not show worsening lung cancer. Obtain MRI of the brain to make sure patient does not have brain mets.   Non-severe (moderate) malnutrition in context of chronic illness  --supplements per dietician          Subjective:  Per family, patient was more awake for the last 24 hours.  Appetite seems to be better.  Physical Exam: Vitals:   02/03/23 2016 02/04/23 0510 02/04/23 0747 02/04/23 0810  BP:  137/66 126/69   Pulse:  83 75   Resp:  16 14   Temp:  97.7 F (36.5 C) 98.9 F (37.2 C)   TempSrc:  Oral    SpO2: 95% 95% 96% 94%  Weight:  57.6 kg    Height:       General exam: Appears calm and comfortable  Respiratory system: Clear to auscultation. Respiratory effort normal. Cardiovascular system: S1 & S2 heard, RRR. No JVD, murmurs, rubs, gallops or clicks.  Gastrointestinal system: Abdomen is nondistended, soft and nontender. No organomegaly or masses felt. Normal bowel sounds heard. Central nervous system: Alert and oriented x2. No focal neurological deficits. Extremities: No edema. Skin: No rashes, lesions or ulcers Psychiatry:  Mood & affect appropriate.    Data Reviewed:  Lab results reviewed.  Family Communication: Son updated at the bedside.  Disposition: Status is: Inpatient Remains inpatient appropriate  because: Severity of disease, IV antibiotics.     Time spent: 35 minutes  Author: Marrion Coy, MD 02/04/2023 11:57 AM  For on call review [email or web address].

## 2023-02-04 NOTE — Progress Notes (Signed)
Mobility Specialist - Progress Note    02/04/23 1608  Mobility  Activity Transferred from chair to bed  Level of Assistance Maximum assist, patient does 25-49%  Assistive Device None  Range of Motion/Exercises Active  Activity Response Tolerated well  Mobility Referral Yes  $Mobility charge 1 Mobility  Mobility Specialist Start Time (ACUTE ONLY) 1548  Mobility Specialist Stop Time (ACUTE ONLY) 1609  Mobility Specialist Time Calculation (min) (ACUTE ONLY) 21 min   Pt resting in bed on 1.5L upon entry. Pt transferred back to bed MaxA. Pt left with needs in reach and bed alarm activated.   Johnathan Hausen Mobility Specialist 02/04/23, 4:11 PM

## 2023-02-05 DIAGNOSIS — R6521 Severe sepsis with septic shock: Secondary | ICD-10-CM | POA: Diagnosis not present

## 2023-02-05 DIAGNOSIS — A419 Sepsis, unspecified organism: Secondary | ICD-10-CM | POA: Diagnosis not present

## 2023-02-05 DIAGNOSIS — K264 Chronic or unspecified duodenal ulcer with hemorrhage: Secondary | ICD-10-CM | POA: Diagnosis not present

## 2023-02-05 DIAGNOSIS — J690 Pneumonitis due to inhalation of food and vomit: Secondary | ICD-10-CM | POA: Diagnosis not present

## 2023-02-05 DIAGNOSIS — J9621 Acute and chronic respiratory failure with hypoxia: Secondary | ICD-10-CM | POA: Diagnosis not present

## 2023-02-05 LAB — CULTURE, RESPIRATORY W GRAM STAIN: Gram Stain: NONE SEEN

## 2023-02-05 MED ORDER — PANTOPRAZOLE SODIUM 40 MG PO TBEC
40.0000 mg | DELAYED_RELEASE_TABLET | Freq: Two times a day (BID) | ORAL | Status: DC
  Administered 2023-02-05 – 2023-02-07 (×4): 40 mg via ORAL
  Filled 2023-02-05 (×4): qty 1

## 2023-02-05 NOTE — TOC Progression Note (Signed)
Transition of Care [facility]) - Progression Note    Patient Details  Name: Crystal Mcmahon MRN: [id number] Date of Birth: September 01, 1941  [facility] of [facility] CM/SW Contact  Chapman Fitch, RN Phone Number: 02/05/2023, 4:50 PM  Clinical Narrative:     Called and followed up with son Orvilla Fus about discharge disposition. He is aware that recs are still for SNF, and I reviewed patients mobility from today's session with him. Tommy declines SNF and wishes for patient to return home. Orvilla Fus states that he is visiting from [place], and that him, his sister, and patients friend Tyler Aas will be able to provide assistance at discharge  Orvilla Fus is interested in setting up private pay caregivers at discharge.  Orvilla Fus states that he is familiar with [facility] and would like a referral to be made.  Referral made to [facility] with [facility]  Tommy would like home health services at discharge.  Orvilla Fus states that he does not have a preference of home health agency.  Referral made and accepted by [facility] with [facility] states that patient has a raised toilet seat, transport chair, rollator, and O2 at home.  Judeth Cornfield with PT recommend hospital bed and lift   Tommy in agreement for DME and referral made to [facility] with [facility]      Barriers to Discharge: Continued Medical Work up  Expected Discharge Plan and Services       Living arrangements for the past 2 months: Single Family Home                                       Social Determinants of Health (SDOH) Interventions SDOH Screenings   Food Insecurity: No Food Insecurity (01/23/2023)  Housing: Low Risk  (01/23/2023)  Transportation Needs: No Transportation Needs (01/23/2023)  Recent Concern: Transportation Needs - Unmet Transportation Needs (01/16/2023)  Utilities: Not At Risk (01/23/2023)  Alcohol Screen: Low Risk  (01/12/2023)  Depression (PHQ2-9): Low Risk  (11/29/2022)  Financial Resource Strain: Low Risk  (01/12/2023)   Physical Activity: Inactive (01/12/2023)  Social Connections: Moderately Integrated (01/12/2023)  Recent Concern: Social Connections - Moderately Isolated (01/12/2023)  Stress: No Stress Concern Present (01/12/2023)  Tobacco Use: High Risk (01/30/2023)    Readmission Risk Interventions     No data to display

## 2023-02-05 NOTE — Care Management Important Message (Signed)
Important Message  Patient Details  Name: Crystal Mcmahon MRN: [id number] Date of Birth: 07/16/1941   Medicare Important Message Given:  Yes     Johnell Comings 02/05/2023, 11:30 AM

## 2023-02-05 NOTE — Progress Notes (Addendum)
Progress Note   Patient: Crystal Mcmahon ZOX:[id number] DOB: Jul 24, 1941 DOA: 01/23/2023     13 DOS: the patient was seen and examined on 02/05/2023   Brief hospital course: 82 yo female with stage 4 lung cancer with mets to the bone s/p neck radiation due to cervical mets (dx November 21, 2022) to the left neck.  She presented to [facility] ER on 05/7 with fevers, tachycardia, and hypoxia.  At baseline pt wears 2L O2 via nasal canula.  She has been followed by oncology and received her first dose of keytruda on 01/22/23.  She was recently taken off her antihypertensives due to hypotension and s/p dex taper.     Upon arrival to the ER pts O2 sats were 84% on baseline O2@2L  via nasal canula requiring increase in O2 to 4L with improvement in oxygenation.  CXR concerning for pneumonia with trace pleural effusions and unchanged left apical mass.  Sepsis protocol initiated and pt received 1,750 ml iv fluid bolus, metronidazole, vancomycin, and cefepime.  Pt remained hypotensive requiring low dose levophed gtt.  PCCM team contacted for ICU admission.  Pt was off pressor and transferred to hospitalist service on 01/24/23. Patient condition does not seem to be improving, CT chest performed on 5/15 showed bilateral upper lobe worsening pneumonia with cavitary lesion, consistent with aspiration pneumonia.  Zosyn was started.   Principal Problem:   Septic shock (HCC) Active Problems:   Stage 4 lung cancer (HCC)   Pneumonia of right upper lobe due to infectious organism   Palliative care encounter   Pressure injury of skin   Malnutrition of moderate degree   Gastrointestinal hemorrhage   Hypernatremia   Hypophosphatemia   Thrombocytopenia (HCC)   Duodenal ulcer with hemorrhage   Acute on chronic respiratory failure with hypoxia (HCC)   Aspiration pneumonia (HCC)   Assessment and Plan:  #Septic and hypovolemic shock  #Mildly elevated troponin likely secondary to demand ischemia in the setting of sepsis Bilateral  upper lobe pneumonia, likely aspiration pneumonia. Patient condition appears to be improving, no longer has any hypotension.  Repeated chest images static with chest CT scan on 5/15 showed bilateral upper lobe pneumonia, with some cavitary lesion.  Discussed with pulmonology, this most likely aspiration pneumonia with Pseudomonas risk.  Started on Zosyn, will change to oral antibiotics with Pseudomonas coverage at discharge for total course of 21 days.  Echocardiogram will be repeated to make sure patient does not have valvular involvement. Echocardiogram was performed, ejection fraction 60 to 65%, no vegetation observed. Sputum culture was also sent out, pending results. MRI of the brain did not show any septic emboli versus metastasis. Sputum culture growing Staphylococcus aureus, susceptibility is still pending.  Due to improvement of condition, probably MSSA.  Continue Zosyn for now until final culture results available.   # AKI secondary to septic shock.  Ruled in. Hypernatremia. Hypokalemia. Hypophosphatemia. Recheck labs tomorrow.   # acute blood loss anemia #Iron deficiency anemia  Thrombocytopenia.  # Upper GI bleed 2/2 duodenal ulcer # gastritis --coffee ground emesis and large melanotic stool on 5/9 --EGD 5/9 found large ulcer just past the pylorus in the duodenum. Another ulcer in the pylorus. The culprit lesion was the duodenal ulcer which was treated.  --found re-bleeding morning of 5/13, vascular performed coil embolization of the pancreaticcoduodenal artery and gastroduodenal artery. Patient still on PPI IV twice a day, hemoglobin seem to be better. Patient had a episode of black stool 5/16, discussed with Dr. Timothy Lasso, patient is status post  embolization for GI bleed, some black stool is expected.  Continue monitor hemoglobin.  Continue PPI. Condition stabilized, change PPI to oral.  Recheck a CBC tomorrow.   #Acute on chronic hypoxic respiratory failure  Chronic O2 @2L  O2 and  Current Everyday Smoker --2/2 PNA in the setting of lung cancer --increased O2 requirement on 5/11 to 7L.  Did get IVF in the past days.  O2 down to 4L next day after diuresis.  Though should consider pembro induced lung toxicity at this point, per pulm. Oxygenation has back to baseline.   #Right pleural effusion 2/2 parapneumonic effusion --thoracentesis on 5/8, with 330 ml removed Lab test from the thoracentesis showed exudates, but the majority of the white cells are lymphocytes, not infectious.  Cytology did not see any malignant cells.   #Stage IV lung cancer with mets to the bone Chest CT scan did not show worsening lung cancer. Obtain MRI of the brain to make sure patient does not have brain mets.   Non-severe (moderate) malnutrition in context of chronic illness  --supplements per dietician     Pressure Injury Elbow Posterior;Right Stage 2, present on admission  Follow    Subjective:  Patient still has a poor appetite, but eating better.  Still very weak.  Physical Exam: Vitals:   02/04/23 2116 02/05/23 0140 02/05/23 0429 02/05/23 0755  BP:   (!) 141/70 138/78  Pulse:   82 87  Resp:   16 18  Temp:   97.8 F (36.6 C) (!) 96.7 F (35.9 C)  TempSrc:   Axillary   SpO2: 93%  93% 99%  Weight:  58.3 kg    Height:       General exam: Appears calm and comfortable  Respiratory system: Clear to auscultation. Respiratory effort normal. Cardiovascular system: S1 & S2 heard, RRR. No JVD, murmurs, rubs, gallops or clicks. No pedal edema. Gastrointestinal system: Abdomen is nondistended, soft and nontender. No organomegaly or masses felt. Normal bowel sounds heard. Central nervous system: Alert and oriented x2. No focal neurological deficits. Extremities: Symmetric 5 x 5 power. Skin: No rashes, lesions or ulcers Psychiatry:  Mood & affect appropriate.    Data Reviewed:  Lab results reviewed.  Family Communication: Son updated at bedside.  Disposition: Status is:  Inpatient Remains inpatient appropriate because: Severity of disease, IV antibiotics.     Time spent: 35 minutes  Author: Marrion Coy, MD 02/05/2023 11:20 AM  For on call review [email or web address].

## 2023-02-05 NOTE — Progress Notes (Signed)
    Durable Medical Equipment  (From admission, onward)           Start     Ordered   02/05/23 1639  For home use only DME Hospital bed  Once       Question Answer Comment  Length of Need Lifetime   Patient has (list medical condition): Lung cancer, pain, stage 2 on sacrum and incontinent   The above medical condition requires: Patient requires the ability to reposition frequently   Head must be elevated greater than: 45 degrees   Bed type Semi-electric   Hoyer Lift Yes   Support Surface: Gel Overlay      02/05/23 1639

## 2023-02-05 NOTE — Progress Notes (Signed)
Crystal Mcmahon   DOB:Jan 26, 1941   JJ#:[id number]    Subjective: No acute issues overnight.  Patient denies any worsening shortness of breath or cough.  MRI brain-negative for any septic emboli or any metastatic disease pain.  Objective:  Vitals:   02/05/23 0429 02/05/23 0755  BP: (!) 141/70 138/78  Pulse: 82 87  Resp: 16 18  Temp: 97.8 F (36.6 C) (!) 96.7 F (35.9 C)  SpO2: 93% 99%     Intake/Output Summary (Last 24 hours) at 02/05/2023 1425 Last data filed at 02/05/2023 0618 Gross per 24 hour  Intake 752.12 ml  Output 400 ml  Net 352.12 ml   Patient awake; more verbal today.  Alone.   Physical Exam Vitals and nursing note reviewed.  HENT:     Head: Normocephalic and atraumatic.     Mouth/Throat:     Pharynx: Oropharynx is clear.  Eyes:     Extraocular Movements: Extraocular movements intact.     Pupils: Pupils are equal, round, and reactive to light.  Cardiovascular:     Rate and Rhythm: Normal rate and regular rhythm.  Pulmonary:     Comments: Decreased breath sounds bilaterally.  Abdominal:     Palpations: Abdomen is soft.  Musculoskeletal:        General: Normal range of motion.     Cervical back: Normal range of motion.  Skin:    General: Skin is warm.  Neurological:     General: No focal deficit present.     Mental Status: She is alert and oriented to person, place, and time.  Psychiatric:        Behavior: Behavior normal.        Judgment: Judgment normal.      Labs:  Lab Results  Component Value Date   WBC 7.1 02/04/2023   HGB 10.4 (L) 02/04/2023   HCT 32.9 (L) 02/04/2023   MCV 91.4 02/04/2023   PLT 147 (L) 02/04/2023   NEUTROABS 6.3 01/25/2023    Lab Results  Component Value Date   NA 146 (H) 02/04/2023   K 3.7 02/04/2023   CL 114 (H) 02/04/2023   CO2 25 02/04/2023    Studies:  No results found.  No problem-specific Assessment & Plan notes found for this encounter.  # 82 year old female patient with multiple medical problems  including COPD/history of smoking metastatic lung cancer-currently admitted to hospital for pneumonia/septic shock.   # Metastatic left upper lobe lung cancer-s/p cycle #1 of Keytruda 05/06.  Further therapy is currently on hold given patient's acute acute illness.  Discussed that patient's subsequent treatments will be started only if patient clinically improves/pneumonia resolves.   # Bilateral pneumonia with septic emboli/sepsis-question complicated by aspiration-s/p evaluation with pulmonary.  On Zosyn.  However if patient's pneumonia is not improved/worsens-hospice will be reasonable.  # Mental status changes- MAY 2024-MRI brain negative for any acute process.   # Upper GI bleed/duodenal ulcer-radiation esophagitis-s/p EGD-no further bleeding noted.  Stable.   # Pain secondary to underlying tumor involving left cervical region.  Stable  Earna Coder, MD 02/05/2023  2:25 PM

## 2023-02-05 NOTE — Progress Notes (Signed)
Physical Therapy Treatment Patient Details Name: Crystal Mcmahon MRN: [id number] DOB: 1941/03/12 Today's Date: 02/05/2023   History of Present Illness Pt admitted for sepsis due to PNA with complaints of fever, hypoxia, and tachy. 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    PT Comments    Pt is making limited progress towards goals with depressed affect this date. Pt not responsive and avoids eye contact with therapist keeping eyes closed majority of time. Reluctantly agreeable to bed->chair transfer with son encouragement. Very poor participation. Will continue to progress as able.  Recommendations for follow up therapy are one component of a multi-disciplinary discharge planning process, led by the attending physician.  Recommendations may be updated based on patient status, additional functional criteria and insurance authorization.  Follow Up Recommendations  Can patient physically be transported by private vehicle: No    Assistance Recommended at Discharge Frequent or constant Supervision/Assistance  Patient can return home with the following Two people to help with walking and/or transfers;A lot of help with bathing/dressing/bathroom;Help with stairs or ramp for entrance;Assistance with cooking/housework;Assistance with feeding;Assist for transportation;Direct supervision/assist for medications management   Equipment Recommendations  Other (comment)    Recommendations for Other Services       Precautions / Restrictions Precautions Precautions: Fall Restrictions Weight Bearing Restrictions: No     Mobility  Bed Mobility Overal bed mobility: Needs Assistance Bed Mobility: Supine to Sit, Sit to Supine, Rolling Rolling: Mod assist   Supine to sit: Mod assist      General bed mobility comments: performed bed mobility with heavy encouragement. Once seated at EOB, able to sit with min assist    Transfers Overall transfer level: Needs assistance Equipment used: 1 person hand held assist Transfers: Bed to chair/wheelchair/BSC   Stand pivot transfers: Max assist         General transfer comment: poor effort given by patient. Recommend +2 for return transfer. Once seated in recliner, assisted with positioning    Ambulation/Gait               General Gait Details: unable   Stairs             Wheelchair Mobility    Modified Rankin (Stroke Patients Only)       Balance Overall balance assessment: Needs assistance, History of Falls Sitting-balance support: Feet supported Sitting balance-Leahy Scale: Fair                                      Cognition Arousal/Alertness: Lethargic Behavior During Therapy: Flat affect Overall Cognitive Status: Difficult to assess                                 General Comments: very sleepy and doesn't respond to commands despite extra cues and encouragment from son. Appears depressed affect        Exercises Other Exercises Other Exercises: attempted ther-ex, not engaged with therapy this date requiring max/total assist for SLRs, hip abd/add, and heel slides    General Comments        Pertinent Vitals/Pain Pain Assessment Pain Assessment: No/denies pain    Home Living  Prior Function            PT Goals (current goals can now be found in the care plan section) Acute Rehab PT Goals Patient Stated Goal: unable to state goal this date PT Goal Formulation: With patient/family Time For Goal Achievement: 02/13/23 Potential to Achieve Goals: Good Progress towards PT goals: Progressing toward goals    Frequency    Min 2X/week      PT Plan Current plan remains appropriate    Co-evaluation               AM-PAC PT "6 Clicks" Mobility   Outcome Measure  Help needed turning from your back to your side while in a flat bed without using bedrails?: A Lot Help needed moving from lying on your back to sitting on the side of a flat bed without using bedrails?: Total Help needed moving to and from a bed to a chair (including a wheelchair)?: Total Help needed standing up from a chair using your arms (e.g., wheelchair or bedside chair)?: Total Help needed to walk in hospital room?: Total Help needed climbing 3-5 steps with a railing? : Total 6 Click Score: 7    End of Session Equipment Utilized During Treatment: Oxygen Activity Tolerance: Patient limited by fatigue Patient left: in chair;with chair alarm set (with son in room) Nurse Communication: Mobility status PT Visit Diagnosis: Unsteadiness on feet (R26.81);Muscle weakness (generalized) (M62.81);History of falling (Z91.81);Difficulty in walking, not elsewhere classified (R26.2)     Time: 4098-1191 PT Time Calculation (min) (ACUTE ONLY): 23 min  Charges:  $Therapeutic Exercise: 8-22 mins $Therapeutic Activity: 8-22 mins                     Elizabeth Palau, PT, DPT, GCS [phone]    Crystal Mcmahon 02/05/2023, 3:10 PM

## 2023-02-06 DIAGNOSIS — J9621 Acute and chronic respiratory failure with hypoxia: Secondary | ICD-10-CM | POA: Diagnosis not present

## 2023-02-06 DIAGNOSIS — E440 Moderate protein-calorie malnutrition: Secondary | ICD-10-CM

## 2023-02-06 DIAGNOSIS — A419 Sepsis, unspecified organism: Secondary | ICD-10-CM | POA: Diagnosis not present

## 2023-02-06 DIAGNOSIS — J189 Pneumonia, unspecified organism: Secondary | ICD-10-CM | POA: Diagnosis not present

## 2023-02-06 DIAGNOSIS — R6521 Severe sepsis with septic shock: Secondary | ICD-10-CM | POA: Diagnosis not present

## 2023-02-06 DIAGNOSIS — J690 Pneumonitis due to inhalation of food and vomit: Secondary | ICD-10-CM | POA: Diagnosis not present

## 2023-02-06 DIAGNOSIS — C3490 Malignant neoplasm of unspecified part of unspecified bronchus or lung: Secondary | ICD-10-CM | POA: Diagnosis not present

## 2023-02-06 LAB — CBC
HCT: 32 % — ABNORMAL LOW (ref 36.0–46.0)
Hemoglobin: 10.3 g/dL — ABNORMAL LOW (ref 12.0–15.0)
MCH: 29.1 pg (ref 26.0–34.0)
MCHC: 32.2 g/dL (ref 30.0–36.0)
MCV: 90.4 fL (ref 80.0–100.0)
Platelets: 148 10*3/uL — ABNORMAL LOW (ref 150–400)
RBC: 3.54 MIL/uL — ABNORMAL LOW (ref 3.87–5.11)
RDW: 17.6 % — ABNORMAL HIGH (ref 11.5–15.5)
WBC: 5.9 10*3/uL (ref 4.0–10.5)
nRBC: 0 % (ref 0.0–0.2)

## 2023-02-06 LAB — BASIC METABOLIC PANEL
Anion gap: 8 (ref 5–15)
BUN: 28 mg/dL — ABNORMAL HIGH (ref 8–23)
CO2: 28 mmol/L (ref 22–32)
Calcium: 6.9 mg/dL — ABNORMAL LOW (ref 8.9–10.3)
Chloride: 109 mmol/L (ref 98–111)
Creatinine, Ser: 0.84 mg/dL (ref 0.44–1.00)
GFR, Estimated: >60 mL/min (ref >60)
Glucose, Bld: 120 mg/dL — ABNORMAL HIGH (ref 70–99)
Potassium: 2.5 mmol/L — CL (ref 3.5–5.1)
Sodium: 145 mmol/L (ref 135–145)

## 2023-02-06 LAB — MAGNESIUM: Magnesium: 1.7 mg/dL (ref 1.7–2.4)

## 2023-02-06 LAB — PHOSPHORUS: Phosphorus: 3.1 mg/dL (ref 2.5–4.6)

## 2023-02-06 LAB — POTASSIUM: Potassium: 3.3 mmol/L — ABNORMAL LOW (ref 3.5–5.1)

## 2023-02-06 MED ORDER — AMOXICILLIN 500 MG PO CAPS
500.0000 mg | ORAL_CAPSULE | Freq: Three times a day (TID) | ORAL | Status: DC
  Administered 2023-02-06 – 2023-02-07 (×2): 500 mg via ORAL
  Filled 2023-02-06 (×3): qty 1

## 2023-02-06 MED ORDER — PROSOURCE PLUS PO LIQD
30.0000 mL | Freq: Three times a day (TID) | ORAL | Status: DC
  Administered 2023-02-06: 30 mL via ORAL
  Filled 2023-02-06: qty 30

## 2023-02-06 MED ORDER — POTASSIUM CHLORIDE CRYS ER 20 MEQ PO TBCR
40.0000 meq | EXTENDED_RELEASE_TABLET | ORAL | Status: AC
  Administered 2023-02-06: 40 meq via ORAL
  Filled 2023-02-06: qty 2

## 2023-02-06 MED ORDER — MAGNESIUM SULFATE 2 GM/50ML IV SOLN
2.0000 g | Freq: Once | INTRAVENOUS | Status: AC
  Administered 2023-02-06: 2 g via INTRAVENOUS
  Filled 2023-02-06: qty 50

## 2023-02-06 MED ORDER — POTASSIUM CHLORIDE CRYS ER 20 MEQ PO TBCR
40.0000 meq | EXTENDED_RELEASE_TABLET | Freq: Every day | ORAL | Status: DC
  Administered 2023-02-07: 40 meq via ORAL
  Filled 2023-02-06: qty 2

## 2023-02-06 MED ORDER — POTASSIUM CHLORIDE 10 MEQ/100ML IV SOLN
10.0000 meq | INTRAVENOUS | Status: AC
  Administered 2023-02-06 (×2): 10 meq via INTRAVENOUS
  Filled 2023-02-06 (×2): qty 100

## 2023-02-06 MED ORDER — POTASSIUM CHLORIDE CRYS ER 20 MEQ PO TBCR
40.0000 meq | EXTENDED_RELEASE_TABLET | Freq: Once | ORAL | Status: AC
  Administered 2023-02-06: 40 meq via ORAL
  Filled 2023-02-06: qty 2

## 2023-02-06 MED ORDER — POTASSIUM CHLORIDE CRYS ER 20 MEQ PO TBCR
40.0000 meq | EXTENDED_RELEASE_TABLET | ORAL | Status: AC
  Administered 2023-02-06 (×2): 40 meq via ORAL
  Filled 2023-02-06 (×2): qty 2

## 2023-02-06 NOTE — TOC Progression Note (Signed)
Transition of Care [facility]) - Progression Note    Patient Details  Name: Crystal Mcmahon MRN: [id number] Date of Birth: [birth year]  Transition of Care [facility]) CM/SW Contact  Chapman Fitch, RN Phone Number: 02/06/2023, 4:23 PM  Clinical Narrative:     Bed offers presented to son Tommy.  Tommy request for search to be extended Search extended in [employer] has declined.  Call placed to Tiffany at [facility], text message sent x2 to follow up with Tiffany and let her know that patient will not be receiving cancer treatments at discharge.  Awaiting response     Barriers to Discharge: Continued Medical Work up  Expected Discharge Plan and Services       Living arrangements for the past 2 months: Single Family Home                                       Social Determinants of Health (SDOH) Interventions SDOH Screenings   Food Insecurity: No Food Insecurity (01/23/2023)  Housing: Low Risk  (01/23/2023)  Transportation Needs: No Transportation Needs (01/23/2023)  Recent Concern: Transportation Needs - Unmet Transportation Needs (01/16/2023)  Utilities: Not At Risk (01/23/2023)  Alcohol Screen: Low Risk  (01/12/2023)  Depression (PHQ2-9): Low Risk  (11/29/2022)  Financial Resource Strain: Low Risk  (01/12/2023)  Physical Activity: Inactive (01/12/2023)  Social Connections: Moderately Integrated (01/12/2023)  Recent Concern: Social Connections - Moderately Isolated (01/12/2023)  Stress: No Stress Concern Present (01/12/2023)  Tobacco Use: High Risk (01/30/2023)    Readmission Risk Interventions     No data to display

## 2023-02-06 NOTE — Progress Notes (Signed)
Nutrition Follow-up  DOCUMENTATION CODES:   Non-severe (moderate) malnutrition in context of chronic illness  INTERVENTION:   -Continue Ensure Enlive po TID, each supplement provides 350 kcal and 20 grams of protein. 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  Ongoing  GOAL:   Patient will meet greater than or equal to 90% of their needs  Progressing   MONITOR:   PO intake, Supplement acceptance, Diet advancement, Labs, Weight trends, I & O's, Skin  REASON FOR ASSESSMENT:   Consult Assessment of nutrition requirement/status  ASSESSMENT:   82 y/o female with h/o thyroid cancer s/p rt hemithyroidectomy 03/2018, stage IV lung cancer with bone mets s/p chemo/radiation, breast cancer s/p lumpectomy/radiation, stroke, HTN, GERD, IDA and anxiety who is admited with PNA, sepsis and AKI.  5/8- s/p thoracentesis (330 ml removed) 5/9- s/p EGD- revealed hiatal hernia, non-bleeding gastric ulcer, hematin in stomach, moderately severe radiation esophagitis with no bleeding, non-bleeding duodenal ulcers with adherent clot (injected, hemostatic spray applied); advanced to clear liquid diet  5/13- s/p coil embolization 5/16- s/p BSE- dysphagia 3 diet with thin liquids 5/18- s/p BSE- advanced to regular diet  Pt sleeping soundly at time of visit. RD did not disturb.   Per oncology notes, MRI of brain negative for septic emboli.   Pt remains with poor oral intake. Pt is on a regular diet and  noted meal completions 20-50%. Pt has been refusing the last several doses of Ensure.   Wt has been stable since admission.   Palliative care following; plan to continue current treatment to see if she improves. Family willing for comfort care/ hospice if she decline. Per Palliative NP, pt is not a candidate for artifical nutrition.   Per TOC notes, plan for SNF at discharge.  Medications reviewed and include vitamin C.  Labs reviewed: K: 2.5, CBGS: 139.   Diet Order:   Diet Order             Diet regular Room service appropriate? Yes with Assist; Fluid consistency: Thin  Diet effective now                   EDUCATION NEEDS:   No education needs have been identified at this time  Skin:  Skin Assessment: Skin Integrity Issues: Skin Integrity Issues:: Stage II Stage II: lt arm, rt elbow  Last BM:  02/04/23  Height:   Ht Readings from Last 1 Encounters:  01/25/23 5' (1.524 m)    Weight:   Wt Readings from Last 1 Encounters:  02/06/23 56.8 kg    Ideal Body Weight:  45.5 kg  BMI:  Body mass index is 24.46 kg/m.  Estimated Nutritional Needs:   Kcal:  1500-1700kcal/day  Protein:  75-85g/day  Fluid:  1.4-1.6L/day    Levada Schilling, RD, LDN, CDCES Registered Dietitian II Certified Diabetes Care and Education Specialist Please refer to [facility] for RD and/or RD on-call/weekend/after hours pager

## 2023-02-06 NOTE — Progress Notes (Signed)
Occupational Therapy Treatment Patient Details Name: Crystal Mcmahon MRN: [id number] DOB: March 07, 1941 Today's Date: 02/06/2023   History of present illness Pt admitted for sepsis due to PNA with complaints of fever, hypoxia, and tachy. 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   OT comments  Patient received in semi-fowler's position in bed and agreeable to OT. Family present. Tx session targeted increasing activity tolerance for improved ADL completion. Pt required Max A to come to EOB this date. Pt tolerated sitting EOB for ~5 min in order to wash her face. Pt able to maintain static sitting intermittently with close SBA and BUE support. OT noting posterior and R lateral lean in sitting during dynamic ADL task requiring Min A to correct. Pt deferred further self-care tasks and functional transfers due to fatigue. Pt was returned to supine with Max A and required assistance for repositioning in bed. Pt left as received with all needs in reach. Pt is making progress toward goal completion. D/C recommendation remains appropriate. OT will continue to follow acutely.     Recommendations for follow up therapy are one component of a multi-disciplinary discharge planning process, led by the attending physician.  Recommendations may be updated based on patient status, additional functional criteria and insurance authorization.    Assistance Recommended at Discharge Frequent or constant Supervision/Assistance  Patient can return home with the following  Two people to help with walking and/or transfers;A lot of help with bathing/dressing/bathroom;Assistance with cooking/housework;Assist for transportation;Help with stairs or ramp for entrance   Equipment Recommendations  Other (comment) (defer to next  venue of care)    Recommendations for Other Services      Precautions / Restrictions Precautions Precautions: Fall Restrictions Weight Bearing Restrictions: No       Mobility Bed Mobility Overal bed mobility: Needs Assistance Bed Mobility: Supine to Sit, Sit to Supine, Rolling Rolling: Mod assist   Supine to sit: Max assist Sit to supine: Max assist   General bed mobility comments: Max A required to scoot pt's hips forward at EOB; +2 to scoot pt up towards [facility]    Transfers                   General transfer comment: pt deferred     Balance Overall balance assessment: Needs assistance, History of Falls Sitting-balance support: Feet supported Sitting balance-Leahy Scale: Fair Sitting balance - Comments: steady static sitting at times but pt intermittently leaning back and to the R 2/2 weakness, physical assistance required for upright balance Postural control: Posterior lean, Right lateral lean                                 ADL either performed or assessed with clinical judgement   ADL Overall ADL's : Needs assistance/impaired     Grooming: Sitting;Wash/dry face Grooming Details (indicate cue type and reason): OT placed warm washcloth on pt's R hand, able to wash all aspects of face with set up A. CGA-Min A required for dynamic sitting balance.             Lower Body Dressing: Maximal assistance;Sitting/lateral leans Lower Body Dressing Details (indicate cue type and reason): socks               General ADL  Comments: Pt continues to be functionally limited in ADL tasks due to generalized weakness and decreased endurance.    Extremity/Trunk Assessment Upper Extremity Assessment Upper Extremity Assessment: Generalized weakness   Lower Extremity Assessment Lower Extremity Assessment: Generalized weakness        Vision Baseline Vision/History: 1 Wears glasses Patient Visual Report: No change from baseline     Perception      Praxis      Cognition Arousal/Alertness: Awake/alert Behavior During Therapy: WFL for tasks assessed/performed Overall Cognitive Status: Within Functional Limits for tasks assessed         General Comments: Pt more talkative at beginning of session, in better mood with family visiting (daughter surprised her from out of town). With activity, pt fatiguing quickly and not as responsive to therapist's questions.        Exercises      Shoulder Instructions       General Comments Pt received on 2L O2 via . SpO2 95% and HR 60s at rest. SpO2 92% and HR 90s with activity.    Pertinent Vitals/ Pain       Pain Assessment Pain Assessment: No/denies pain  Home Living              Prior Functioning/Environment              Frequency  Min 1X/week        Progress Toward Goals  OT Goals(current goals can now be found in the care plan section)  Progress towards OT goals: Progressing toward goals  Acute Rehab OT Goals Patient Stated Goal: Get better OT Goal Formulation: With patient/family Time For Goal Achievement: 02/14/23 Potential to Achieve Goals: Good  Plan Discharge plan remains appropriate;Frequency needs to be updated    Co-evaluation                 AM-PAC OT "6 Clicks" Daily Activity     Outcome Measure   Help from another person eating meals?: A Lot Help from another person taking care of personal grooming?: A Little Help from another person toileting, which includes using toliet, bedpan, or urinal?: Total Help from another person bathing (including washing, rinsing, drying)?: Total Help from another person to put on and taking off regular upper body clothing?: Total Help from another person to put on and taking off regular lower body clothing?: A Lot 6 Click Score: 10    End of Session Equipment Utilized During Treatment: Oxygen  OT Visit Diagnosis: Other abnormalities of gait and mobility (R26.89);Muscle weakness (generalized)  (M62.81);History of falling (Z91.81)   Activity Tolerance Patient limited by fatigue   Patient Left in bed;with call bell/phone within reach;with family/visitor present;with bed alarm set   Nurse Communication Mobility status        Time: 1610-9604 OT Time Calculation (min): 20 min  Charges: OT General Charges $OT Visit: 1 Visit OT Treatments $Self Care/Home Management : 8-22 mins  [facility] MS, OTR/L ascom [phone]  02/06/23, 6:18 PM

## 2023-02-06 NOTE — TOC Progression Note (Signed)
Transition of Care [facility]) - Progression Note    Patient Details  Name: Crystal Mcmahon MRN: [id number] Date of Birth: 01/04/1941  Transition of [facility]) CM/SW Contact  Chapman Fitch, RN Phone Number: 02/06/2023, 12:04 PM  Clinical Narrative:    Received return call from son 40.   Family has changed their mind and would like to pursue SNF  Met with patient and daughter and son in law at bedside.  Patient confirms she is agreeable to SNF  Barbara Cower with [facility] notified Abigail with [facility] notified DME cancelled with [employer]  PASRR obtained  Fl2 sent for signature  Bed search initiated  Per Tommy top choices are  - [facility] - they are not able to offer a bed - [facility] - tiffany to review      Barriers to Discharge: Continued Medical Work up  Expected Discharge Plan and Services       Living arrangements for the past 2 months: Single Family Home                                       Social Determinants of Health (SDOH) Interventions SDOH Screenings   Food Insecurity: No Food Insecurity (01/23/2023)  Housing: Low Risk  (01/23/2023)  Transportation Needs: No Transportation Needs (01/23/2023)  Recent Concern: Transportation Needs - Unmet Transportation Needs (01/16/2023)  Utilities: Not At Risk (01/23/2023)  Alcohol Screen: Low Risk  (01/12/2023)  Depression (PHQ2-9): Low Risk  (11/29/2022)  Financial Resource Strain: Low Risk  (01/12/2023)  Physical Activity: Inactive (01/12/2023)  Social Connections: Moderately Integrated (01/12/2023)  Recent Concern: Social Connections - Moderately Isolated (01/12/2023)  Stress: No Stress Concern Present (01/12/2023)  Tobacco Use: High Risk (01/30/2023)    Readmission Risk Interventions     No data to display

## 2023-02-06 NOTE — [place] FL2 (Signed)
[place] MEDICAID FL2 LEVEL OF CARE FORM     IDENTIFICATION  Patient Name: Crystal Mcmahon Birthdate: [birth year] Sex: female Admission Date (Current Location): 01/23/2023  [facility] and [place] Number:  Chiropodist and Address:         Provider Number: [id number]  Attending Physician Name and Address:  Marrion Coy, MD  Relative Name and Phone Number:       Current Level of Care: Hospital Recommended Level of Care: Skilled Nursing Facility Prior Approval Number:    Date Approved/Denied:   PASRR Number: pending  Discharge Plan: SNF    Current Diagnoses: Patient Active Problem List   Diagnosis Date Noted   Aspiration pneumonia (HCC) 02/01/2023   Gastrointestinal hemorrhage 01/31/2023   Hypernatremia 01/31/2023   Hypophosphatemia 01/31/2023   Thrombocytopenia (HCC) 01/31/2023   Duodenal ulcer with hemorrhage 01/31/2023   Acute on chronic respiratory failure with hypoxia (HCC) 01/31/2023   Palliative care encounter 01/25/2023   Pressure injury of skin 01/25/2023   Malnutrition of moderate degree 01/25/2023   Septic shock (HCC) 01/23/2023   Pneumonia of right upper lobe due to infectious organism 01/23/2023   Stage 4 lung cancer (HCC) 12/22/2022   Cancer, metastatic to bone (HCC) 11/28/2022   Arthritis 07/06/2015   Malignant neoplasm of breast (HCC) 07/06/2015   BP (high blood pressure) 07/06/2015   Headache, migraine 07/06/2015   Personal history of tobacco use, presenting hazards to health 05/31/2015   Tobacco abuse 01/27/2015    Orientation RESPIRATION BLADDER Height & Weight     Self, Place  O2 (2L [place]) Incontinent Weight: 56.8 kg Height:  5' (152.4 cm)  BEHAVIORAL SYMPTOMS/MOOD NEUROLOGICAL BOWEL NUTRITION STATUS      Incontinent Diet (regular)  AMBULATORY STATUS COMMUNICATION OF NEEDS Skin   Extensive Assist Verbally PU Stage and Appropriate Care, Skin abrasions, Bruising                       Personal Care Assistance Level of  Assistance              Functional Limitations Info             SPECIAL CARE FACTORS FREQUENCY  PT (By licensed PT), OT (By licensed OT)                    Contractures Contractures Info: Not present    Additional Factors Info  Code Status, Allergies Code Status Info: DNR Allergies Info: Acetaminophen-pamabrom, Codeine, Midol Pm (Diphenhydramine-apap (Sleep))           Current Medications (02/06/2023):  This is the current hospital active medication list Current Facility-Administered Medications  Medication Dose Route Frequency Provider Last Rate Last Admin   0.9 %  sodium chloride infusion (Manually program via Guardrails IV Fluids)   Intravenous Once Annice Needy, MD       0.9 %  sodium chloride infusion  250 mL Intravenous Continuous Annice Needy, MD 10 mL/hr at 02/06/23 0629 250 mL at 02/06/23 0629   albuterol (PROVENTIL) (2.5 MG/3ML) 0.083% nebulizer solution 2.5 mg  2.5 mg Nebulization Q6H PRN Salena Saner, MD       arformoterol [facility]) nebulizer solution 15 mcg  15 mcg Nebulization BID Salena Saner, MD   15 mcg at 02/06/23 0730   ascorbic acid (VITAMIN C) tablet 500 mg  500 mg Oral BID Annice Needy, MD   500 mg at 02/06/23 4782   Chlorhexidine Gluconate Cloth 2 %  PADS 6 each  6 each Topical QHS Annice Needy, MD   6 each at 02/06/23 0923   citalopram (CELEXA) tablet 20 mg  20 mg Oral Daily Marrion Coy, MD   20 mg at 02/06/23 4098   docusate sodium (COLACE) capsule 100 mg  100 mg Oral BID PRN Annice Needy, MD       feeding supplement (ENSURE ENLIVE / ENSURE PLUS) liquid 237 mL  237 mL Oral TID BM Darlin Priestly, MD   237 mL at 02/04/23 1349   gabapentin (NEURONTIN) capsule 300 mg  300 mg Oral TID Annice Needy, MD   300 mg at 02/06/23 1191   multivitamin with minerals tablet 1 tablet  1 tablet Oral Daily Annice Needy, MD   1 tablet at 02/06/23 4782   nystatin (MYCOSTATIN) 100000 UNIT/ML suspension 500,000 Units  5 mL Oral QID Foust, Katy L, NP   500,000  Units at 02/06/23 0922   ondansetron (ZOFRAN) injection 4 mg  4 mg Intravenous Q6H PRN Annice Needy, MD   4 mg at 01/30/23 0940   ondansetron (ZOFRAN) injection 4 mg  4 mg Intravenous Q6H PRN Annice Needy, MD       oxyCODONE (Oxy IR/ROXICODONE) immediate release tablet 5 mg  5 mg Oral Q4H PRN Marrion Coy, MD       pantoprazole (PROTONIX) EC tablet 40 mg  40 mg Oral BID AC Marrion Coy, MD   40 mg at 02/06/23 0923   piperacillin-tazobactam (ZOSYN) IVPB 3.375 g  3.375 g Intravenous Tobe Sos, MD 12.5 mL/hr at 02/06/23 0451 3.375 g at 02/06/23 0451   polyethylene glycol (MIRALAX / GLYCOLAX) packet 17 g  17 g Oral Daily PRN Annice Needy, MD       potassium chloride SA (KLOR-CON M) CR tablet 40 mEq  40 mEq Oral Q4H Foust, Katy L, NP   40 mEq at 02/06/23 9562   revefenacin (YUPELRI) nebulizer solution 175 mcg  175 mcg Nebulization Daily Salena Saner, MD   175 mcg at 02/06/23 0730     Discharge Medications: Please see discharge summary for a list of discharge medications.  Relevant Imaging Results:  Relevant Lab Results:   Additional Information ss [id number]  Chapman Fitch, RN

## 2023-02-06 NOTE — TOC Progression Note (Signed)
Transition of Care [facility]) - Progression Note    Patient Details  Name: Crystal Mcmahon MRN: [id number] Date of Birth: Mar 24, 1941  Transition of Care [facility]) CM/SW Contact  Chapman Fitch, RN Phone Number: 02/06/2023, 12:03 PM  Clinical Narrative:     Cherlyn Roberts [phone] A    Barriers to Discharge: Continued Medical Work up  Expected Discharge Plan and Services       Living arrangements for the past 2 months: Single Family Home                                       Social Determinants of Health (SDOH) Interventions SDOH Screenings   Food Insecurity: No Food Insecurity (01/23/2023)  Housing: Low Risk  (01/23/2023)  Transportation Needs: No Transportation Needs (01/23/2023)  Recent Concern: Transportation Needs - Unmet Transportation Needs (01/16/2023)  Utilities: Not At Risk (01/23/2023)  Alcohol Screen: Low Risk  (01/12/2023)  Depression (PHQ2-9): Low Risk  (11/29/2022)  Financial Resource Strain: Low Risk  (01/12/2023)  Physical Activity: Inactive (01/12/2023)  Social Connections: Moderately Integrated (01/12/2023)  Recent Concern: Social Connections - Moderately Isolated (01/12/2023)  Stress: No Stress Concern Present (01/12/2023)  Tobacco Use: High Risk (01/30/2023)    Readmission Risk Interventions     No data to display

## 2023-02-06 NOTE — Progress Notes (Signed)
Progress Note   Patient: Crystal Mcmahon [id number]:[id number] DOB: 15-Jun-1941 DOA: 01/23/2023     14 DOS: the patient was seen and examined on 02/06/2023   Brief hospital course: 82 yo female with stage 4 lung cancer with mets to the bone s/p neck radiation due to cervical mets (dx November 21, 2022) to the left neck.  She presented to [facility] ER on 05/7 with fevers, tachycardia, and hypoxia.  At baseline pt wears 2L O2 via nasal canula.  She has been followed by oncology and received her first dose of keytruda on 01/22/23.  She was recently taken off her antihypertensives due to hypotension and s/p dex taper.     Upon arrival to the ER pts O2 sats were 84% on baseline O2@2L  via nasal canula requiring increase in O2 to 4L with improvement in oxygenation.  CXR concerning for pneumonia with trace pleural effusions and unchanged left apical mass.  Sepsis protocol initiated and pt received 1,750 ml iv fluid bolus, metronidazole, vancomycin, and cefepime.  Pt remained hypotensive requiring low dose levophed gtt.  PCCM team contacted for ICU admission.  Pt was off pressor and transferred to hospitalist service on 01/24/23. Patient condition does not seem to be improving, CT chest performed on 5/15 showed bilateral upper lobe worsening pneumonia with cavitary lesion, consistent with aspiration pneumonia.  Zosyn was started. Condition improved, antibiotic changed to amoxicillin.  Pending nursing home placement.   Principal Problem:   Septic shock (HCC) Active Problems:   Stage 4 lung cancer (HCC)   Pneumonia of right upper lobe due to infectious organism   Palliative care encounter   Pressure injury of skin   Malnutrition of moderate degree   Gastrointestinal hemorrhage   Hypernatremia   Hypophosphatemia   Thrombocytopenia (HCC)   Duodenal ulcer with hemorrhage   Acute on chronic respiratory failure with hypoxia (HCC)   Aspiration pneumonia (HCC)   Assessment and Plan: #Septic and hypovolemic shock  #Mildly  elevated troponin likely secondary to demand ischemia in the setting of sepsis Bilateral upper lobe pneumonia, likely aspiration pneumonia. Patient condition appears to be improving, no longer has any hypotension.  Repeated chest images static with chest CT scan on 5/15 showed bilateral upper lobe pneumonia, with some cavitary lesion.  Discussed with pulmonology, this most likely aspiration pneumonia with Pseudomonas risk.  Started on Zosyn Echocardiogram was performed, ejection fraction 60 to 65%, no vegetation observed. MRI of the brain did not show any septic emboli versus metastasis. Sputum culture growing MSSA, antibiotics switched to oral amoxicillin, will continue for 3 more weeks.  # AKI secondary to septic shock.  Ruled in. Hypernatremia. Hypokalemia. Hypophosphatemia. Condition mostly improved, potassium still low, continue replete.   # acute blood loss anemia #Iron deficiency anemia  Thrombocytopenia.  # Upper GI bleed 2/2 duodenal ulcer # gastritis --coffee ground emesis and large melanotic stool on 5/9 --EGD 5/9 found large ulcer just past the pylorus in the duodenum. Another ulcer in the pylorus. The culprit lesion was the duodenal ulcer which was treated.  --found re-bleeding morning of 5/13, vascular performed coil embolization of the pancreaticcoduodenal artery and gastroduodenal artery. Patient still on PPI IV twice a day, hemoglobin seem to be better. Patient had a episode of black stool 5/16, discussed with Dr. Timothy Lasso, patient is status post embolization for GI bleed, some black stool is expected.  Continue monitor hemoglobin.  Continue PPI. Condition has stabilized.   #Acute on chronic hypoxic respiratory failure  Chronic O2 @2L  O2 and Current Everyday Smoker --  2/2 PNA in the setting of lung cancer --increased O2 requirement on 5/11 to 7L.  Did get IVF in the past days.  O2 down to 4L next day after diuresis.  Though should consider pembro induced lung toxicity at this  point, per pulm. Oxygenation has back to baseline.   #Right pleural effusion 2/2 parapneumonic effusion --thoracentesis on 5/8, with 330 ml removed Lab test from the thoracentesis showed exudates, but the majority of the white cells are lymphocytes, not infectious.  Cytology did not see any malignant cells.   #Stage IV lung cancer with mets to the bone Chest CT scan did not show worsening lung cancer. Obtain MRI of the brain to make sure patient does not have brain mets.   Non-severe (moderate) malnutrition in context of chronic illness  --supplements per dietician     Pressure Injury Elbow Posterior;Right Stage 2, present on admission  Follow       Subjective:  Patient still has a poor appetite, otherwise condition has improved.  Physical Exam: Vitals:   02/06/23 0207 02/06/23 0441 02/06/23 0732 02/06/23 0812  BP:  121/63  (!) 122/57  Pulse:  76 79 80  Resp:  16 16 18   Temp:  (!) 97.5 F (36.4 C)  98 F (36.7 C)  TempSrc:  Oral    SpO2:  97% 95% 99%  Weight: 56.8 kg     Height:       General exam: Appears calm and comfortable  Respiratory system: Clear to auscultation. Respiratory effort normal. Cardiovascular system: S1 & S2 heard, RRR. No JVD, murmurs, rubs, gallops or clicks. No pedal edema. Gastrointestinal system: Abdomen is nondistended, soft and nontender. No organomegaly or masses felt. Normal bowel sounds heard. Central nervous system: Alert and oriented x2. No focal neurological deficits. Extremities: Symmetric 5 x 5 power. Skin: No rashes, lesions or ulcers Psychiatry: Mood & affect appropriate.    Data Reviewed:  Lab results reviewed.  Family Communication: Daughter updated at bedside.  Disposition: Status is: Inpatient Remains inpatient appropriate because: Severity of disease, unsafe discharge.     Time spent: 35 minutes  Author: Marrion Coy, MD 02/06/2023 3:01 PM  For on call review [email or web address].

## 2023-02-06 NOTE — Progress Notes (Signed)
NAME:  Crystal Mcmahon, MRN:  [id number], DOB:  1941/01/17, LOS: 14 ADMISSION DATE:  01/23/2023, INITIAL CONSULTATION DATE: 01/23/2023 FOLLOW-UP REQUESTED: 02/01/2023 REFERRING MD: Dr. 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  She presented to [facility] ER on 05/7 with fevers, tachycardia, and hypoxia.  At baseline pt wears 2L O2 via nasal canula.  She has been followed by oncology and received her first dose of keytruda on 01/22/23.  She was recently taken off her antihypertensives due to hypotension and s/p dex taper.  She endorses poor po intake due to decreased appetite.    Oncology hx: Primary cancer of left upper lobe of lung  12/22/2022 Initial Diagnosis    Primary cancer of left upper lobe of lung    12/22/2022 Cancer Staging    Staging form: Lung, AJCC 8th Edition - Clinical: Stage IVB (cT2, cN2, cM1c) - Signed by Earna Coder, MD on 12/22/2022    01/19/2023 -  Chemotherapy    Patient is on Treatment Plan : LUNG NSCLC Pembrolizumab (200) q21d      ED Course Upon arrival to the ER pts O2 sats were 84% on baseline O2@2L  via nasal canula requiring increase in O2 to 4L with improvement in oxygenation.  ER vital signs were: temp 101.1 F/sbp 98/hr 118/resp rate 15/O2 sats 96% on 4L.  ER lab results: BUN 29/creatinine 1.10/calcium 8.6/albumin 1.9/AST 44/troponin 30/pct 0.83/hgb 8.8.  CXR concerning for pneumonia with trace pleural effusions and unchanged left apical mass.  Sepsis protocol initiated and pt received 1,750 ml iv fluid bolus, metronidazole, vancomycin, and cefepime.  Pt remained hypotensive requiring low dose levophed gtt.  PCCM team contacted for ICU admission.   Hospital course thus far: Patient was transferred to the hospitalist service for ongoing management of  her pneumonia and pleural effusions.  Her hospital course has been complicated by upper GI bleed necessitating EGD on 25 Jan 2023 showing gastric ulcers, esophagitis and a hiatal hernia.  She then had to undergo embolization by vascular surgery due to ongoing bleeding from a duodenal ulcer.  She underwent a CT yesterday, 15 May, for follow-up of her pneumonia.  This showed extensive airspace disease within the left upper lobe and left apex and same on the right upper lobe.  Multiple pulmonary nodules.  Some areas of cavitation.  She has required conscious sedation on a number of these procedures.   We are asked to assess the patient for potential need for bronchoscopy.  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 New small          right-greater-than-left pleural effusions and bibasilar atelectasis. Similar left apical mass        with associated  pleural thickening. Multiple new large solid pulmonary nodules,        concerning for progressive metastatic disease, although findings could also be infectious.        Recommend short-term follow-up 1-2 months for further evaluation. Stable mediastinal         and left supraclavicular lymphadenopathy. Aortic Atherosclerosis (ICD10-I70.0) 05/08: Right thoracentesis 05/09: EGD for upper GI bleed 05/13: Embolization due to persistent duodenal ulcer bleed performed by vascular  surgery 05/15: CT chest: Interval progression of dense airspace consolidation within the right upper lobe, new dense airspace consolidation within the left upper lobe, worsening multifocal pneumonia suspected.  Interval cavitation of previously noted lung nodules query septic emboli, enlarged upper abdominal lymph nodes 05/17: Mostly bedridden, lethargic. 05/21: More interactive.  No tachypnea.  States feels better.  Micro Data:  COVID-19 05/07>>negative  Influenza A&B 05/07>>negative  RSV 05/07>>negative  Blood x2 05/07>> no growth Sputum 05/16>> Staph aureus  Anti-infectives (From admission, onward)    Start     Dose/Rate Route Frequency Ordered Stop   02/06/23 2200  amoxicillin (AMOXIL) capsule 500 mg        500 mg Oral Every 8 hours 02/06/23 1445     02/01/23 2200  piperacillin-tazobactam (ZOSYN) IVPB 3.375 g  Status:  Discontinued        3.375 g 12.5 mL/hr over 240 Minutes Intravenous Every 8 hours 02/01/23 1503 02/06/23 1445   01/31/23 2100  piperacillin-tazobactam (ZOSYN) IVPB 4.5 g  Status:  Discontinued        4.5 g 25 mL/hr over 240 Minutes Intravenous Every 8 hours 01/31/23 1658 02/01/23 1503   01/29/23 1251  ceFAZolin (ANCEF) IVPB 2g/100 mL premix        2 g 200 mL/hr over 30 Minutes Intravenous 30 min pre-op 01/29/23 1252 01/29/23 1405   01/28/23 1200  azithromycin (ZITHROMAX) 500 mg in sodium chloride 0.9 % 250 mL IVPB        500 mg 250 mL/hr over 60 Minutes Intravenous Every 24 hours 01/28/23 0904 01/30/23 1300   01/25/23 1300  vancomycin (VANCOREADY) IVPB 1250 mg/250 mL  Status:  Discontinued        1,250 mg 166.7 mL/hr over 90 Minutes Intravenous Every 48 hours 01/24/23 0115 01/24/23 1343   01/25/23 1230  erythromycin 250 mg in sodium chloride 0.9 % 100 mL IVPB        250 mg 100 mL/hr over 60 Minutes Intravenous NOW 01/25/23 1136 01/25/23 1831   01/24/23 1600  vancomycin (VANCOREADY) IVPB 750 mg/150 mL  Status:  Discontinued        750 mg 150 mL/hr over 60  Minutes Intravenous Every 24 hours 01/24/23 1343 01/26/23 1519   01/24/23 0145  vancomycin (VANCOREADY) IVPB 1250 mg/250 mL  Status:  Discontinued        1,250 mg 166.7 mL/hr over 90 Minutes Intravenous Every 24 hours 01/24/23 0045 01/24/23 0115   01/24/23 0100  ceFEPIme (MAXIPIME) 2 g in sodium chloride 0.9 % 100 mL IVPB  Status:  Discontinued        2 g 200 mL/hr over 30 Minutes Intravenous Every 12 hours 01/24/23 0045 01/30/23 1915   01/23/23 1230  ceFEPIme (MAXIPIME) 2 g in sodium chloride 0.9 % 100 mL IVPB        2 g 200 mL/hr over 30 Minutes Intravenous  Once 01/23/23 1220 01/23/23 1305   01/23/23 1230  metroNIDAZOLE (FLAGYL) IVPB 500 mg        500 mg 100 mL/hr over  60 Minutes Intravenous  Once 01/23/23 1220 01/23/23 1422   01/23/23 1230  vancomycin (VANCOCIN) IVPB 1000 mg/200 mL premix  Status:  Discontinued        1,000 mg 200 mL/hr over 60 Minutes Intravenous  Once 01/23/23 1220 01/23/23 1223   01/23/23 1230  vancomycin (VANCOREADY) IVPB 1250 mg/250 mL        1,250 mg 166.7 mL/hr over 90 Minutes Intravenous  Once 01/23/23 1223 01/23/23 1650      Interim History / Subjective:  More awake and alert today.  Eating more.  Interactive with family.  Oxygen requirement at baseline 2 L /min.  No tachypnea or respiratory distress.    Objective   Blood pressure (!) 122/57, pulse 80, temperature 98 F (36.7 C), resp. rate 18, height 5' (1.524 m), weight 56.8 kg, SpO2 99 %.    SpO2: 99 % O2 Flow Rate (L/min): 2 L/min FiO2 (%): 28 %   Intake/Output Summary (Last 24 hours) at 02/06/2023 1727 Last data filed at 02/06/2023 1054 Gross per 24 hour  Intake 240 ml  Output 350 ml  Net -110 ml    Filed Weights   02/05/23 0140 02/06/23 0017 02/06/23 0207  Weight: 58.3 kg 56.8 kg 56.8 kg   Examination: General: Acute on chronically-ill female sitting up in bed, NAD on 2 L O2 via nasal canula  HENT: Supple, no JVD  Lungs: Faint rhonchi throughout, no wheezes, even, non labored   Cardiovascular: NSR, s1s2, rrr, no m/r/g, 2+ radial/2+ distal pulses, no edema  Abdomen: +BS x4, none distended, soft, non tender  Extremities: Significant sarcopenia, moves all extremities, significant debility/frailty Neuro: Wake and alert, oriented, following commands, PERRLA, significant psychomotor retardation GU: Has external urinary catheter in place Skin: Healed marks from generalized rash  Imaging Reviewed  Representative images from CT performed 31 Jan 2023, independently reviewed:        Assessment & Plan:  Multifocal necrotizing pneumonia Query aspiration Staphylococcus aureus (MSSA) Antibiotics switched to amoxicillin Continue pulmonary hygiene Chest x-ray in a.m.  Acute on chronic hypoxic respiratory failure  Pneumonia  Pleural effusions  Stage IV lung cancer with mets to the bone Hx: Chronic O2 @2L  O2 and Current Everyday Smoker  - Continue supplemental O2, currently on 2 L and tolerating-this is her baseline - Scheduled and prn bronchodilator therapy (Brovana/Yupelri/albuterol) - O2 requirements have decreased - Palliative Care following  Protein calorie malnutrition  Cancer cachexia - Continue regular diet  - Dietitian consulted appreciate input  - Patient's p.o. intake improving  Acute encephalopathy Toxic/metabolic Improving mental status Replete electrolytes as needed Continue supportive measures  Debility/frailty PT OT as tolerated This issue adds complexity to her management Agree with SNF placement   Best Practice (right click and "Reselect all SmartList Selections" daily)   Diet/type: Regular consistency (see orders) DVT prophylaxis: LMWH GI prophylaxis: Protonix Lines: N/A Foley:  N/A, has external urinary catheter Code Status:  DNR Last date of multidisciplinary goals of care discussion [N/A]   Labs   CBC: Recent Labs  Lab 02/01/23 0712 02/01/23 1547 02/02/23 0440 02/03/23 0415 02/04/23 0408 02/06/23 0403  WBC 8.0   --  7.7 7.4 7.1 5.9  HGB 10.3* 10.7* 9.9* 10.8* 10.4* 10.3*  HCT 32.4*  --  31.1* 33.1* 32.9* 32.0*  MCV 90.0  --  89.9 90.4 91.4 90.4  PLT 103*  --  112* 139* 147* 148*     Basic Metabolic Panel: Recent Labs  Lab 01/31/23 0237 02/01/23 0657 02/02/23 0440 02/03/23 0415  02/04/23 0408 02/06/23 0403 02/06/23 1420  NA 148* 147* 146* 144 146* 145  --   K 3.6 3.4* 2.8* 3.4* 3.7 2.5* 3.3*  CL 119* 114* 112* 111 114* 109  --   CO2 23 23 25 26 25 28   --   GLUCOSE 170* 85 80 74 106* 120*  --   BUN 36* 34* 32* 29* 30* 28*  --   CREATININE 0.84 0.92 0.89 0.89 0.93 0.84  --   CALCIUM 6.1* 6.4* 6.4* 7.0* 7.3* 6.9*  --   MG 2.3 2.1 1.8 1.8  --  1.7  --   PHOS 1.4* 2.1* 2.7 3.0  --  3.1  --     GFR: Estimated Creatinine Clearance: 40.1 mL/min (by C-G formula based on SCr of 0.84 mg/dL). Recent Labs  Lab 02/01/23 0657 02/01/23 0712 02/02/23 0440 02/03/23 0415 02/04/23 0408 02/06/23 0403  PROCALCITON 0.20  --   --   --   --   --   WBC  --    < > 7.7 7.4 7.1 5.9   < > = values in this interval not displayed.     Liver Function Tests: Recent Labs  Lab 01/31/23 0237  AST 38  ALT 12  ALKPHOS 37*  BILITOT 0.8  PROT 4.2*  ALBUMIN 1.9*    No results for input(s): "LIPASE", "AMYLASE" in the last 168 hours. No results for input(s): "AMMONIA" in the last 168 hours.  ABG    Component Value Date/Time   HCO3 21.1 01/25/2023 0557   ACIDBASEDEF 3.0 (H) 01/25/2023 0557   O2SAT 34.3 01/25/2023 0557     Coagulation Profile: No results for input(s): "INR", "PROTIME" in the last 168 hours.   Cardiac Enzymes: No results for input(s): "CKTOTAL", "CKMB", "CKMBINDEX", "TROPONINI" in the last 168 hours.  HbA1C: No results found for: "HGBA1C"  CBG: Recent Labs  Lab 01/31/23 0439  GLUCAP 139*     Review of Systems  Patient too lethargic to participate in review of systems.  Allergies Allergies  Allergen Reactions   Acetaminophen-Pamabrom Rash and Swelling    Other  reaction(s): Other (See Comments)  Other Reaction: RASH & SWELLING   Codeine Nausea And Vomiting   Midol Pm [Diphenhydramine-Apap (Sleep)] Swelling and Rash         Home Medications  Prior to Admission medications   Medication Sig Start Date End Date Taking? Authorizing Provider  levofloxacin (LEVAQUIN) 500 MG tablet Take 1 tablet (500 mg total) by mouth daily for 7 days. 01/23/23 01/30/23  Alinda Dooms, NP  acetaminophen (TYLENOL) 650 MG CR tablet Take 650 mg by mouth every 8 (eight) hours as needed for pain.    [provider]  amLODipine (NORVASC) 2.5 MG tablet Take 2.5 mg by mouth daily.  Patient not taking: Reported on 01/19/2023 01/14/16   [provider]  atorvastatin (LIPITOR) 20 MG tablet Take 20 mg by mouth daily.    [provider]  calcium carbonate (OSCAL) 1500 (600 CA) MG TABS tablet Take 600 mg of elemental calcium by mouth 2 (two) times daily with a meal.    [provider]  citalopram (CELEXA) 10 MG tablet Take 10 mg by mouth daily.    [provider]  gabapentin (NEURONTIN) 300 MG capsule Take 300 mg by mouth in the morning, at noon, and at bedtime. 11/27/22 12/27/22  [provider]  lansoprazole (PREVACID) 30 MG capsule Take 1 capsule (30 mg total) by mouth daily at 12 noon. 12/22/22  Carmina Miller, MD  meloxicam (MOBIC) 15 MG tablet Take 1 tablet (15 mg total) by mouth daily. 12/22/22   Earna Coder, MD  morphine (MSIR) 15 MG tablet 1/2 -1 pill every 8- 12 hours as needed. 01/19/23   Earna Coder, MD  ondansetron (ZOFRAN) 8 MG tablet One pill every 8 hours as needed for nausea/vomitting. 12/07/22   Earna Coder, MD  sucralfate (CARAFATE) 1 g tablet Take 1 tablet (1 g total) by mouth 3 (three) times daily. Dissolve tablet in 3to 4 tables spoons warm water swish and swallow 12/22/22   Chrystal, Sherrine Maples, MD  triamterene-hydrochlorothiazide (DYAZIDE) 37.5-25 MG capsule Take 1 capsule by mouth daily.  Patient  not taking: Reported on 01/19/2023 06/10/15   [provider]   Scheduled Meds:  (feeding supplement) PROSource Plus  30 mL Oral TID BM   sodium chloride   Intravenous Once   amoxicillin  500 mg Oral Q8H   arformoterol  15 mcg Nebulization BID   vitamin C  500 mg Oral BID   Chlorhexidine Gluconate Cloth  6 each Topical QHS   citalopram  20 mg Oral Daily   feeding supplement  237 mL Oral TID BM   gabapentin  300 mg Oral TID   multivitamin with minerals  1 tablet Oral Daily   nystatin  5 mL Oral QID   pantoprazole  40 mg Oral BID AC   potassium chloride  40 mEq Oral Q2H   [START ON 02/07/2023] potassium chloride  40 mEq Oral Daily   revefenacin  175 mcg Nebulization Daily   Continuous Infusions:  sodium chloride 250 mL (02/06/23 0629)   PRN Meds:.albuterol, docusate sodium, ondansetron (ZOFRAN) IV, ondansetron (ZOFRAN) IV, oxyCODONE, polyethylene glycol    Level 2 visit      C. Danice Goltz, MD Advanced Bronchoscopy PCCM [facility]    *This note was dictated using voice recognition software/Dragon.  Despite best efforts to proofread, errors can occur which can change the meaning. Any transcriptional errors that result from this process are unintentional and may not be fully corrected at the time of dictation.

## 2023-02-07 ENCOUNTER — Inpatient Hospital Stay: Payer: Medicare Other

## 2023-02-07 DIAGNOSIS — J9621 Acute and chronic respiratory failure with hypoxia: Secondary | ICD-10-CM | POA: Diagnosis not present

## 2023-02-07 DIAGNOSIS — J15211 Pneumonia due to Methicillin susceptible Staphylococcus aureus: Secondary | ICD-10-CM

## 2023-02-07 DIAGNOSIS — J690 Pneumonitis due to inhalation of food and vomit: Secondary | ICD-10-CM | POA: Diagnosis not present

## 2023-02-07 DIAGNOSIS — A419 Sepsis, unspecified organism: Secondary | ICD-10-CM | POA: Diagnosis not present

## 2023-02-07 DIAGNOSIS — R6521 Severe sepsis with septic shock: Secondary | ICD-10-CM | POA: Diagnosis not present

## 2023-02-07 LAB — CBC
HCT: 32.5 % — ABNORMAL LOW (ref 36.0–46.0)
Hemoglobin: 10.2 g/dL — ABNORMAL LOW (ref 12.0–15.0)
MCH: 28.8 pg (ref 26.0–34.0)
MCHC: 31.4 g/dL (ref 30.0–36.0)
MCV: 91.8 fL (ref 80.0–100.0)
Platelets: 150 10*3/uL (ref 150–400)
RBC: 3.54 MIL/uL — ABNORMAL LOW (ref 3.87–5.11)
RDW: 17.7 % — ABNORMAL HIGH (ref 11.5–15.5)
WBC: 6.8 10*3/uL (ref 4.0–10.5)
nRBC: 0 % (ref 0.0–0.2)

## 2023-02-07 LAB — BASIC METABOLIC PANEL
Anion gap: 6 (ref 5–15)
BUN: 25 mg/dL — ABNORMAL HIGH (ref 8–23)
CO2: 26 mmol/L (ref 22–32)
Calcium: 7.1 mg/dL — ABNORMAL LOW (ref 8.9–10.3)
Chloride: 114 mmol/L — ABNORMAL HIGH (ref 98–111)
Creatinine, Ser: 0.86 mg/dL (ref 0.44–1.00)
GFR, Estimated: >60 mL/min (ref >60)
Glucose, Bld: 105 mg/dL — ABNORMAL HIGH (ref 70–99)
Potassium: 4.4 mmol/L (ref 3.5–5.1)
Sodium: 146 mmol/L — ABNORMAL HIGH (ref 135–145)

## 2023-02-07 MED ORDER — ENSURE ENLIVE PO LIQD: 237.0000 mL | Freq: Three times a day (TID) | ORAL | 12 refills | Status: DC

## 2023-02-07 MED ORDER — POLYETHYLENE GLYCOL 3350 17 G PO PACK: 17.0000 g | PACK | Freq: Every day | ORAL | 0 refills | Status: DC | PRN

## 2023-02-07 MED ORDER — AMOXICILLIN-POT CLAVULANATE 875-125 MG PO TABS: 1.0000 | ORAL_TABLET | Freq: Two times a day (BID) | ORAL | 0 refills | Status: AC

## 2023-02-07 MED ORDER — PROSOURCE PLUS PO LIQD: 30.0000 mL | Freq: Three times a day (TID) | ORAL | Status: DC

## 2023-02-07 MED ORDER — ARFORMOTEROL TARTRATE 15 MCG/2ML IN NEBU: 15.0000 ug | INHALATION_SOLUTION | Freq: Two times a day (BID) | RESPIRATORY_TRACT | Status: DC

## 2023-02-07 MED ORDER — OXYCODONE HCL 5 MG PO TABS: 5.0000 mg | ORAL_TABLET | ORAL | 0 refills | Status: DC | PRN

## 2023-02-07 MED ORDER — DOCUSATE SODIUM 100 MG PO CAPS: 100.0000 mg | ORAL_CAPSULE | Freq: Two times a day (BID) | ORAL | 0 refills | Status: DC | PRN

## 2023-02-07 MED ORDER — ALBUTEROL SULFATE (2.5 MG/3ML) 0.083% IN NEBU: 2.5000 mg | INHALATION_SOLUTION | Freq: Four times a day (QID) | RESPIRATORY_TRACT | 12 refills | Status: DC | PRN

## 2023-02-07 MED ORDER — REVEFENACIN 175 MCG/3ML IN SOLN: 175.0000 ug | Freq: Every day | RESPIRATORY_TRACT | Status: DC

## 2023-02-07 MED ORDER — AMOXICILLIN-POT CLAVULANATE 875-125 MG PO TABS
1.0000 | ORAL_TABLET | Freq: Two times a day (BID) | ORAL | Status: DC
  Administered 2023-02-07: 1 via ORAL
  Filled 2023-02-07: qty 1

## 2023-02-07 MED ORDER — PANTOPRAZOLE SODIUM 40 MG PO TBEC: 40.0000 mg | DELAYED_RELEASE_TABLET | Freq: Two times a day (BID) | ORAL | 2 refills | Status: AC

## 2023-02-07 MED ORDER — ADULT MULTIVITAMIN W/MINERALS CH: 1.0000 | ORAL_TABLET | Freq: Every day | ORAL | Status: DC

## 2023-02-07 NOTE — Progress Notes (Signed)
PT Cancellation Note  Patient Details Name: Crystal Mcmahon MRN: [id number] DOB: Feb 12, 1941   Cancelled Treatment:    Reason Eval/Treat Not Completed: Other (comment). 2 attempts for PT session this date. On first attempt, pt awake and then quickly shuts down and does not engage with therapy session. There-ex attempted with poor participation.   2nd- attempt at 60- RN in room to administer pain meds and requested for PT session to be deferred. Will continue attempts as able.   Keita Demarco 02/07/2023, 10:41 AM Elizabeth Palau, PT, DPT, GCS [phone]

## 2023-02-07 NOTE — Progress Notes (Signed)
NAME:  Crystal Mcmahon, MRN:  [id number], DOB:  1940-11-04, LOS: 15 ADMISSION DATE:  01/23/2023, INITIAL CONSULTATION DATE: 01/23/2023 FOLLOW-UP REQUESTED: 02/01/2023 REFERRING MD: Dr. 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  She presented to [facility] ER on 05/7 with fevers, tachycardia, and hypoxia.  At baseline pt wears 2L O2 via nasal canula.  She has been followed by oncology and received her first dose of keytruda on 01/22/23.  She was recently taken off her antihypertensives due to hypotension and s/p dex taper.  She endorses poor po intake due to decreased appetite.    Oncology hx: Primary cancer of left upper lobe of lung  12/22/2022 Initial Diagnosis    Primary cancer of left upper lobe of lung    12/22/2022 Cancer Staging    Staging form: Lung, AJCC 8th Edition - Clinical: Stage IVB (cT2, cN2, cM1c) - Signed by Earna Coder, MD on 12/22/2022    01/19/2023 -  Chemotherapy    Patient is on Treatment Plan : LUNG NSCLC Pembrolizumab (200) q21d      ED Course Upon arrival to the ER pts O2 sats were 84% on baseline O2@2L  via nasal canula requiring increase in O2 to 4L with improvement in oxygenation.  ER vital signs were: temp 101.1 F/sbp 98/hr 118/resp rate 15/O2 sats 96% on 4L.  ER lab results: BUN 29/creatinine 1.10/calcium 8.6/albumin 1.9/AST 44/troponin 30/pct 0.83/hgb 8.8.  CXR concerning for pneumonia with trace pleural effusions and unchanged left apical mass.  Sepsis protocol initiated and pt received 1,750 ml iv fluid bolus, metronidazole, vancomycin, and cefepime.  Pt remained hypotensive requiring low dose levophed gtt.  PCCM team contacted for ICU admission.   Hospital course thus far: Patient was transferred to the hospitalist service for ongoing management of  her pneumonia and pleural effusions.  Her hospital course has been complicated by upper GI bleed necessitating EGD on 25 Jan 2023 showing gastric ulcers, esophagitis and a hiatal hernia.  She then had to undergo embolization by vascular surgery due to ongoing bleeding from a duodenal ulcer.  She underwent a CT yesterday, 15 May, for follow-up of her pneumonia.  This showed extensive airspace disease within the left upper lobe and left apex and same on the right upper lobe.  Multiple pulmonary nodules.  Some areas of cavitation.  She has required conscious sedation on a number of these procedures.   We are asked to assess the patient for potential need for bronchoscopy.  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 New small          right-greater-than-left pleural effusions and bibasilar atelectasis. Similar left apical mass        with associated  pleural thickening. Multiple new large solid pulmonary nodules,        concerning for progressive metastatic disease, although findings could also be infectious.        Recommend short-term follow-up 1-2 months for further evaluation. Stable mediastinal         and left supraclavicular lymphadenopathy. Aortic Atherosclerosis (ICD10-I70.0) 05/08: Right thoracentesis 05/09: EGD for upper GI bleed 05/13: Embolization due to persistent duodenal ulcer bleed performed by vascular  surgery 05/15: CT chest: Interval progression of dense airspace consolidation within the right upper lobe, new dense airspace consolidation within the left upper lobe, worsening multifocal pneumonia suspected.  Interval cavitation of previously noted lung nodules query septic emboli, enlarged upper abdominal lymph nodes 05/17: Mostly bedridden, lethargic. 05/21: More interactive.  No tachypnea.  States feels better. 05/22: More withdrawn today.  Chest x-ray shows improvement on pneumonia  Micro Data:  COVID-19 05/07>>negative  Influenza A&B 05/07>>negative  RSV 05/07>>negative  Blood x2 05/07>> no growth Sputum 05/16>> Staph aureus (MSSA)  Anti-infectives (From admission, onward)    Start     Dose/Rate Route Frequency Ordered Stop   02/07/23 1200  amoxicillin-clavulanate (AUGMENTIN) 875-125 MG per tablet 1 tablet        1 tablet Oral Every 12 hours 02/07/23 0952     02/06/23 2200  amoxicillin (AMOXIL) capsule 500 mg  Status:  Discontinued        500 mg Oral Every 8 hours 02/06/23 1445 02/07/23 0952   02/01/23 2200  piperacillin-tazobactam (ZOSYN) IVPB 3.375 g  Status:  Discontinued        3.375 g 12.5 mL/hr over 240 Minutes Intravenous Every 8 hours 02/01/23 1503 02/06/23 1445   01/31/23 2100  piperacillin-tazobactam (ZOSYN) IVPB 4.5 g  Status:  Discontinued        4.5 g 25 mL/hr over 240 Minutes Intravenous Every 8 hours 01/31/23 1658 02/01/23 1503   01/29/23 1251  ceFAZolin (ANCEF) IVPB 2g/100 mL premix        2 g 200 mL/hr over 30 Minutes Intravenous 30 min pre-op 01/29/23 1252 01/29/23 1405   01/28/23 1200  azithromycin (ZITHROMAX) 500 mg in sodium chloride 0.9 % 250 mL IVPB        500 mg 250 mL/hr over 60 Minutes Intravenous Every 24 hours 01/28/23 0904 01/30/23 1300   01/25/23 1300  vancomycin (VANCOREADY) IVPB 1250 mg/250 mL  Status:  Discontinued        1,250 mg 166.7 mL/hr over 90 Minutes Intravenous Every 48 hours 01/24/23 0115 01/24/23 1343   01/25/23 1230  erythromycin  250 mg in sodium chloride 0.9 % 100 mL IVPB        250 mg 100 mL/hr over 60 Minutes Intravenous NOW 01/25/23 1136 01/25/23 1831   01/24/23 1600  vancomycin (VANCOREADY) IVPB 750 mg/150 mL  Status:  Discontinued        750 mg 150 mL/hr over 60 Minutes Intravenous Every 24 hours 01/24/23 1343 01/26/23 1519   01/24/23 0145  vancomycin (VANCOREADY) IVPB 1250 mg/250 mL  Status:  Discontinued        1,250 mg 166.7 mL/hr over 90 Minutes Intravenous Every 24 hours 01/24/23 0045 01/24/23 0115   01/24/23 0100  ceFEPIme (MAXIPIME) 2 g in sodium chloride 0.9 % 100 mL IVPB  Status:  Discontinued        2 g 200 mL/hr over 30 Minutes Intravenous Every 12 hours 01/24/23 0045 01/30/23 1915   01/23/23 1230  ceFEPIme (MAXIPIME) 2 g in sodium chloride 0.9 %  100 mL IVPB        2 g 200 mL/hr over 30 Minutes Intravenous  Once 01/23/23 1220 01/23/23 1305   01/23/23 1230  metroNIDAZOLE (FLAGYL) IVPB 500 mg        500 mg 100 mL/hr over 60 Minutes Intravenous  Once 01/23/23 1220 01/23/23 1422   01/23/23 1230  vancomycin (VANCOCIN) IVPB 1000 mg/200 mL premix  Status:  Discontinued        1,000 mg 200 mL/hr over 60 Minutes Intravenous  Once 01/23/23 1220 01/23/23 1223   01/23/23 1230  vancomycin (VANCOREADY) IVPB 1250 mg/250 mL        1,250 mg 166.7 mL/hr over 90 Minutes Intravenous  Once 01/23/23 1223 01/23/23 1650      Interim History / Subjective:  Mentation continues to wax and wane.  Less responsive today.  Oxygen requirement at baseline 2 L /min.  No tachypnea or respiratory distress.    Objective   Blood pressure 136/64, pulse 82, temperature 98.2 F (36.8 C), resp. rate 18, height 5' (1.524 m), weight 56 kg, SpO2 97 %.    SpO2: 97 % O2 Flow Rate (L/min): 2 L/min FiO2 (%): 28 %   Intake/Output Summary (Last 24 hours) at 02/07/2023 1338 Last data filed at 02/07/2023 0600 Gross per 24 hour  Intake 120 ml  Output 325 ml  Net -205 ml    Filed Weights   02/06/23 0017 02/06/23 0207 02/07/23 0553   Weight: 56.8 kg 56.8 kg 56 kg   Examination: General: Acute on chronically-ill female sitting up in bed, NAD on 2 L O2 via nasal canula  HENT: Supple, no JVD  Lungs: No rhonchi noted today, no wheezes, even, non labored  Cardiovascular: NSR, s1s2, rrr, no m/r/g, 2+ radial/2+ distal pulses, no edema  Abdomen: +BS x4, non distended, soft, non tender  Extremities: Significant sarcopenia, moves all extremities, significant debility/frailty Neuro: Wake and alert, oriented, following commands, PERRLA, significant psychomotor retardation GU: Has external urinary catheter in place Skin: Healed marks from generalized rash  Imaging Reviewed  This x-ray performed today, independently reviewed, shows improvement on bilateral pneumonia, small pleural effusions, overall improved radiographic picture:   Assessment & Plan:  Multifocal pneumonia MSSA Staphylococcus aureus (MSSA) Antibiotics switched to Augmentin after discussion with ID Pharm Continue pulmonary hygiene Chest x-ray today shows improvement on pneumonic process  Acute on chronic hypoxic respiratory failure  Pneumonia  Pleural effusions, small Stage IV lung cancer with mets to the bone Hx: Chronic O2 @2L  O2 and Current Everyday Smoker  - Continue supplemental O2, currently on 2 L and tolerating-this is her baseline - Scheduled and prn bronchodilator therapy (Brovana/Yupelri/albuterol) - O2 requirements are at baseline - Palliative Care following - Pleural effusions small, may be reflection of malnutrition and poor oncotic pressure  Protein calorie malnutrition  Cancer cachexia - Continue regular diet  - Dietitian consulted appreciate input  - Patient's p.o. intake poor again today - Check albumin  Acute encephalopathy Toxic/metabolic Improving mental status Replete electrolytes as needed Continue supportive measures  Debility/frailty PT OT as tolerated This issue adds complexity to her management Agree with SNF  placement   Best Practice (right click and "Reselect all SmartList Selections" daily)   Diet/type: Regular consistency (see orders) DVT prophylaxis: LMWH GI prophylaxis: Protonix Lines: N/A Foley:  N/A, has external urinary catheter Code Status:  DNR Last date of multidisciplinary goals of care discussion [N/A]   Labs   CBC: Recent Labs  Lab 02/02/23 0440 02/03/23 0415 02/04/23 0408  02/06/23 0403 02/07/23 0442  WBC 7.7 7.4 7.1 5.9 6.8  HGB 9.9* 10.8* 10.4* 10.3* 10.2*  HCT 31.1* 33.1* 32.9* 32.0* 32.5*  MCV 89.9 90.4 91.4 90.4 91.8  PLT 112* 139* 147* 148* 150     Basic Metabolic Panel: Recent Labs  Lab 02/01/23 0657 02/02/23 0440 02/03/23 0415 02/04/23 0408 02/06/23 0403 02/06/23 1420 02/07/23 0442  NA 147* 146* 144 146* 145  --  146*  K 3.4* 2.8* 3.4* 3.7 2.5* 3.3* 4.4  CL 114* 112* 111 114* 109  --  114*  CO2 23 25 26 25 28   --  26  GLUCOSE 85 80 74 106* 120*  --  105*  BUN 34* 32* 29* 30* 28*  --  25*  CREATININE 0.92 0.89 0.89 0.93 0.84  --  0.86  CALCIUM 6.4* 6.4* 7.0* 7.3* 6.9*  --  7.1*  MG 2.1 1.8 1.8  --  1.7  --   --   PHOS 2.1* 2.7 3.0  --  3.1  --   --     GFR: Estimated Creatinine Clearance: 38.9 mL/min (by C-G formula based on SCr of 0.86 mg/dL). Recent Labs  Lab 02/01/23 0657 02/01/23 0712 02/03/23 0415 02/04/23 0408 02/06/23 0403 02/07/23 0442  PROCALCITON 0.20  --   --   --   --   --   WBC  --    < > 7.4 7.1 5.9 6.8   < > = values in this interval not displayed.     Liver Function Tests: No results for input(s): "AST", "ALT", "ALKPHOS", "BILITOT", "PROT", "ALBUMIN" in the last 168 hours.  No results for input(s): "LIPASE", "AMYLASE" in the last 168 hours. No results for input(s): "AMMONIA" in the last 168 hours.  ABG    Component Value Date/Time   HCO3 21.1 01/25/2023 0557   ACIDBASEDEF 3.0 (H) 01/25/2023 0557   O2SAT 34.3 01/25/2023 0557     Coagulation Profile: No results for input(s): "INR", "PROTIME" in the last  168 hours.   Cardiac Enzymes: No results for input(s): "CKTOTAL", "CKMB", "CKMBINDEX", "TROPONINI" in the last 168 hours.  HbA1C: No results found for: "HGBA1C"  CBG: No results for input(s): "GLUCAP" in the last 168 hours.   Review of Systems  Patient too lethargic to participate in review of systems.  Allergies Allergies  Allergen Reactions   Acetaminophen-Pamabrom Rash and Swelling    Other reaction(s): Other (See Comments)  Other Reaction: RASH & SWELLING   Codeine Nausea And Vomiting   Midol Pm [Diphenhydramine-Apap (Sleep)] Swelling and Rash         Home Medications  Prior to Admission medications   Medication Sig Start Date End Date Taking? Authorizing Provider  levofloxacin (LEVAQUIN) 500 MG tablet Take 1 tablet (500 mg total) by mouth daily for 7 days. 01/23/23 01/30/23  Alinda Dooms, NP  acetaminophen (TYLENOL) 650 MG CR tablet Take 650 mg by mouth every 8 (eight) hours as needed for pain.    [provider]  amLODipine (NORVASC) 2.5 MG tablet Take 2.5 mg by mouth daily.  Patient not taking: Reported on 01/19/2023 01/14/16   [provider]  atorvastatin (LIPITOR) 20 MG tablet Take 20 mg by mouth daily.    [provider]  calcium carbonate (OSCAL) 1500 (600 CA) MG TABS tablet Take 600 mg of elemental calcium by mouth 2 (two) times daily with a meal.    [provider]  citalopram (CELEXA) 10 MG tablet Take 10 mg by mouth daily.  [provider]  gabapentin (NEURONTIN) 300 MG capsule Take 300 mg by mouth in the morning, at noon, and at bedtime. 11/27/22 12/27/22  [provider]  lansoprazole (PREVACID) 30 MG capsule Take 1 capsule (30 mg total) by mouth daily at 12 noon. 12/22/22   Carmina Miller, MD  meloxicam (MOBIC) 15 MG tablet Take 1 tablet (15 mg total) by mouth daily. 12/22/22   Earna Coder, MD  morphine (MSIR) 15 MG tablet 1/2 -1 pill every 8- 12 hours as needed. 01/19/23   Earna Coder, MD   ondansetron (ZOFRAN) 8 MG tablet One pill every 8 hours as needed for nausea/vomitting. 12/07/22   Earna Coder, MD  sucralfate (CARAFATE) 1 g tablet Take 1 tablet (1 g total) by mouth 3 (three) times daily. Dissolve tablet in 3to 4 tables spoons warm water swish and swallow 12/22/22   Chrystal, Sherrine Maples, MD  triamterene-hydrochlorothiazide (DYAZIDE) 37.5-25 MG capsule Take 1 capsule by mouth daily.  Patient not taking: Reported on 01/19/2023 06/10/15   [provider]   Scheduled Meds:  (feeding supplement) PROSource Plus  30 mL Oral TID BM   sodium chloride   Intravenous Once   amoxicillin-clavulanate  1 tablet Oral Q12H   arformoterol  15 mcg Nebulization BID   vitamin C  500 mg Oral BID   Chlorhexidine Gluconate Cloth  6 each Topical QHS   citalopram  20 mg Oral Daily   feeding supplement  237 mL Oral TID BM   gabapentin  300 mg Oral TID   multivitamin with minerals  1 tablet Oral Daily   nystatin  5 mL Oral QID   pantoprazole  40 mg Oral BID AC   potassium chloride  40 mEq Oral Daily   revefenacin  175 mcg Nebulization Daily   Continuous Infusions:  sodium chloride 250 mL (02/06/23 0629)   PRN Meds:.albuterol, docusate sodium, ondansetron (ZOFRAN) IV, ondansetron (ZOFRAN) IV, oxyCODONE, polyethylene glycol    Level 2 visit    Discussed with Dr. Sharl Ma, recommend 14 days therapy with Augmentin p.o.  From our standpoint the patient may be discharged to SNF when this becomes available.  Pulmonary/CCM will sign off, please reconsult as needed.  Gailen Shelter, MD Advanced Bronchoscopy PCCM [facility]    *This note was dictated using voice recognition software/Dragon.  Despite best efforts to proofread, errors can occur which can change the meaning. Any transcriptional errors that result from this process are unintentional and may not be fully corrected at the time of dictation.

## 2023-02-07 NOTE — Discharge Summary (Signed)
Physician Discharge Summary   Patient: Crystal Mcmahon MRN: [id number] DOB: [birth year]  Admit date:     01/23/2023  Discharge date: 02/07/23  Discharge Physician: Meredeth Ide   PCP: [name], MD   Recommendations at discharge:      Discharge Diagnoses: Principal Problem:   Septic shock [facility]) Active Problems:   Stage 4 lung cancer (HCC)   Pneumonia of right upper lobe due to infectious organism   Palliative care encounter   Pressure injury of skin   Malnutrition of moderate degree   Gastrointestinal hemorrhage   Hypernatremia   Hypophosphatemia   Thrombocytopenia (HCC)   Duodenal ulcer with hemorrhage   Acute on chronic respiratory failure with hypoxia (HCC)   Aspiration pneumonia (HCC)  Resolved Problems:   * No resolved hospital problems. 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  She presented to [facility] ER on 05/7 with fevers, tachycardia, and hypoxia.  At baseline pt wears 2L O2 via nasal canula.  She has been followed by oncology and received her first dose of keytruda on 01/22/23.  She was recently taken off her antihypertensives due to hypotension and s/p dex taper.     Upon arrival to the ER pts O2 sats were 84% on baseline O2@2L  via nasal canula requiring increase in O2 to 4L with improvement in oxygenation.  CXR concerning for pneumonia with trace pleural effusions and unchanged left apical mass.  Sepsis protocol initiated and pt received 1,750 ml iv fluid bolus, metronidazole, vancomycin, and cefepime.  Pt remained hypotensive requiring low dose levophed gtt.  PCCM team contacted for ICU admission.  Pt was off pressor and transferred to hospitalist service on 01/24/23. Patient condition does not seem to be improving, CT chest performed on 5/15 showed bilateral upper lobe worsening pneumonia with cavitary lesion, consistent with aspiration pneumonia.  Zosyn  was started. Condition improved, antibiotic changed to Augmentin; will discharge to nursing home   Assessment and Plan:  #Septic and hypovolemic shock  #Mildly elevated troponin likely secondary to demand ischemia in the setting of sepsis Bilateral upper lobe pneumonia, likely aspiration pneumonia. -Sepsis physiology has resolved -  Repeated chest images static with chest CT scan on 5/15 showed bilateral upper lobe pneumonia, with some cavitary lesion.  Discussed with pulmonology, this most likely aspiration pneumonia with Pseudomonas risk.  Started on Zosyn Echocardiogram was performed, ejection fraction 60 to 65%, no vegetation observed. MRI of the brain did not show any septic emboli versus metastasis. Sputum culture growing MSSA, antibiotics switched to oral Augmentin for 2 weeks    # AKI secondary to septic shock.  Ruled in. Hypernatremia. Hypokalemia. Hypophosphatemia. -Resolved   # acute blood loss anemia #Iron deficiency anemia  Thrombocytopenia.  # Upper GI bleed 2/2 duodenal ulcer # gastritis --coffee ground emesis and large melanotic stool on 5/9 --EGD 5/9 found large ulcer just past the pylorus in the duodenum. Another ulcer in the pylorus. The culprit lesion was the duodenal ulcer which was treated.  --found re-bleeding morning of 5/13, vascular performed coil embolization of the pancreaticcoduodenal artery and gastroduodenal artery. Patient still on PPI IV twice a day, hemoglobin seem to be better. Patient had a episode of black stool 5/16, discussed with Dr. Timothy Lasso, patient is status post embolization for GI bleed, some black stool is expected.  Continue monitor hemoglobin.  Protonix 40 mg p.o. twice daily  for 3 months. -Avoid NSAIDs, Mobic, Motrin, Advil, aspirin.    #Acute on chronic hypoxic respiratory failure  Chronic O2 @2L  O2 and Current Everyday Smoker --2/2 PNA in the setting of lung cancer --increased O2 requirement on 5/11 to 7L.  Did get IVF in the past  days.  O2 down to 4L next day after diuresis.  Though should consider pembro induced lung toxicity at this point, per pulm. Oxygenation has back to baseline.   #Right pleural effusion 2/2 parapneumonic effusion --thoracentesis on 5/8, with 330 ml removed Lab test from the thoracentesis showed exudates, but the majority of the white cells are lymphocytes, not infectious.  Cytology did not see any malignant cells.   #Stage IV lung cancer with mets to the bone Chest CT scan did not show worsening lung cancer. -MRI brain was negative for metastasis   Non-severe (moderate) malnutrition in context of chronic illness  --supplements started    Pressure Injury Elbow Posterior;Right Stage 2, present on admission  Follow       Consultants: Pulmonology, gastroenterology Procedures performed: EGD and coiling of pancreaticoduodenal gastroduodenal artery Disposition: Skilled nursing facility Diet recommendation:  Discharge Diet Orders (From admission, onward)     Start     Ordered   02/07/23 0000  Diet - low sodium heart healthy        02/07/23 1353           Regular diet DISCHARGE MEDICATION: Allergies as of 02/07/2023       Reactions   Acetaminophen-pamabrom Rash, Swelling   Other reaction(s): Other (See Comments)  Other Reaction: RASH & SWELLING   Codeine Nausea And Vomiting   Midol Pm [diphenhydramine-apap (sleep)] Swelling, Rash            Medication List     STOP taking these medications    amLODipine 2.5 MG tablet Commonly known as: NORVASC   aspirin EC 325 MG tablet   atorvastatin 20 MG tablet Commonly known as: LIPITOR   lansoprazole 30 MG capsule Commonly known as: PREVACID   levofloxacin 500 MG tablet Commonly known as: Levaquin   meloxicam 15 MG tablet Commonly known as: MOBIC   morphine 15 MG tablet Commonly known as: MSIR   sucralfate 1 g tablet Commonly known as: Carafate   triamterene-hydrochlorothiazide 37.5-25 MG capsule Commonly  known as: DYAZIDE       TAKE these medications    acetaminophen 650 MG CR tablet Commonly known as: TYLENOL Take 650 mg by mouth every 8 (eight) hours as needed for pain.   albuterol (2.5 MG/3ML) 0.083% nebulizer solution Commonly known as: PROVENTIL Take 3 mLs (2.5 mg total) by nebulization every 6 (six) hours as needed for wheezing or shortness of breath.   amoxicillin-clavulanate 875-125 MG tablet Commonly known as: AUGMENTIN Take 1 tablet by mouth every 12 (twelve) hours for 14 days.   arformoterol 15 MCG/2ML Nebu Commonly known as: BROVANA Take 2 mLs (15 mcg total) by nebulization 2 (two) times daily.   calcium carbonate 1500 (600 Ca) MG Tabs tablet Commonly known as: OSCAL Take 600 mg of elemental calcium by mouth 2 (two) times daily with a meal.   citalopram 10 MG tablet Commonly known as: CELEXA Take 10 mg by mouth daily.   docusate sodium 100 MG capsule Commonly known as: COLACE Take 1 capsule (100 mg total) by mouth 2 (two) times daily as needed for mild constipation.   gabapentin 300 MG capsule Commonly known as: NEURONTIN Take 300 mg by mouth in the  morning, at noon, and at bedtime.   ondansetron 8 MG tablet Commonly known as: ZOFRAN One pill every 8 hours as needed for nausea/vomitting. What changed:  how much to take how to take this when to take this reasons to take this   oxyCODONE 5 MG immediate release tablet Commonly known as: Oxy IR/ROXICODONE Take 1 tablet (5 mg total) by mouth every 4 (four) hours as needed for severe pain or moderate pain.   pantoprazole 40 MG tablet Commonly known as: PROTONIX Take 1 tablet (40 mg total) by mouth 2 (two) times daily before a meal.   polyethylene glycol 17 g packet Commonly known as: MIRALAX / GLYCOLAX Take 17 g by mouth daily as needed for moderate constipation.   revefenacin 175 MCG/3ML nebulizer solution Commonly known as: YUPELRI Take 3 mLs (175 mcg total) by nebulization daily. Start taking  on: Feb 08, 2023               Durable Medical Equipment  (From admission, onward)           Start     Ordered   02/05/23 1639  For home use only DME Hospital bed  Once       Question Answer Comment  Length of Need Lifetime   Patient has (list medical condition): Lung cancer, pain, stage 2 on sacrum and incontinent   The above medical condition requires: Patient requires the ability to reposition frequently   Head must be elevated greater than: 45 degrees   Bed type Semi-electric   Hoyer Lift Yes   Support Surface: Gel Overlay      02/05/23 1639            Contact information for after-discharge care     Destination     [facility] Preferred SNF .   Service: Skilled Nursing Contact information: [address] [place] [address] [phone]                    Discharge Exam: Ceasar Mons Weights   02/06/23 0017 02/06/23 0207 02/07/23 0553  Weight: 56.8 kg 56.8 kg 56 kg   General-appears in no acute distress Heart-S1-S2, regular, no murmur auscultated Lungs-clear to auscultation bilaterally, no wheezing or crackles auscultated Abdomen-soft, nontender, no organomegaly Extremities-no edema in the lower extremities Neuro-alert, oriented x3, no focal deficit noted  Condition at discharge: good  The results of significant diagnostics from this hospitalization (including imaging, microbiology, ancillary and laboratory) are listed below for reference.   Imaging Studies: DG Chest Port 1 View  Result Date: 02/07/2023 CLINICAL DATA:  Pneumonia. EXAM: PORTABLE CHEST 1 VIEW COMPARISON:  Chest radiograph and CT 01/31/2023 FINDINGS: The cardiomediastinal silhouette is unchanged. Patchy to confluent airspace opacities persist in the upper lobes but have improved in the interim, particularly in the right upper lobe. Underlying nodular components are noted as shown on CT. There are persistent small pleural effusions, right  larger than left. Atelectasis is noted in the right greater than left lung bases. No pneumothorax is identified. IMPRESSION: 1. Interval improvement of bilateral pneumonia. 2. Persistent small pleural effusions. Electronically Signed   By: Sebastian Ache M.D.   On: 02/07/2023 09:02   ECHOCARDIOGRAM COMPLETE  Result Date: 02/02/2023    ECHOCARDIOGRAM REPORT   Patient Name:   SHILEE SCHLEIGER Date of Exam: 02/02/2023 Medical Rec #:  [id number]       Height:       60.0 in Accession #:    [id number]  Weight:       127.4 lb Date of Birth:  Oct 16, 1940       BSA:          1.541 m Patient Age:    81 years        BP:           127/63 mmHg Patient Gender: F               HR:           91 bpm. Exam Location:  [facility] Procedure: 2D Echo, Cardiac Doppler and Color Doppler Indications:     Endocarditis I38  History:         Patient has prior history of Echocardiogram examinations, most                  recent 01/26/2023. Risk Factors:Hypertension. Tobacco abuse.  Sonographer:     Cristela Blue Referring Phys:  [id number] Marrion Coy Diagnosing Phys: Julien Nordmann MD  Sonographer Comments: Suboptimal apical window. IMPRESSIONS  1. Left ventricular ejection fraction, by estimation, is 60 to 65%. The left ventricle has normal function. The left ventricle has no regional wall motion abnormalities. Left ventricular diastolic parameters are consistent with Grade I diastolic dysfunction (impaired relaxation).  2. Right ventricular systolic function is normal. The right ventricular size is normal.  3. The mitral valve is normal in structure. No evidence of mitral valve regurgitation. No evidence of mitral stenosis.  4. The aortic valve is normal in structure. There is mild calcification of the aortic valve. Aortic valve regurgitation is not visualized. Aortic valve sclerosis is present, with no evidence of aortic valve stenosis.  5. The inferior vena cava is normal in size with greater than 50% respiratory variability, suggesting right atrial  pressure of 3 mmHg. FINDINGS  Left Ventricle: Left ventricular ejection fraction, by estimation, is 60 to 65%. The left ventricle has normal function. The left ventricle has no regional wall motion abnormalities. The left ventricular internal cavity size was normal in size. There is  no left ventricular hypertrophy. Left ventricular diastolic parameters are consistent with Grade I diastolic dysfunction (impaired relaxation). Right Ventricle: The right ventricular size is normal. No increase in right ventricular wall thickness. Right ventricular systolic function is normal. Left Atrium: Left atrial size was normal in size. Right Atrium: Right atrial size was normal in size. Pericardium: There is no evidence of pericardial effusion. Mitral Valve: The mitral valve is normal in structure. No evidence of mitral valve regurgitation. No evidence of mitral valve stenosis. MV peak gradient, 5.2 mmHg. The mean mitral valve gradient is 3.0 mmHg. Tricuspid Valve: The tricuspid valve is normal in structure. Tricuspid valve regurgitation is not demonstrated. No evidence of tricuspid stenosis. Aortic Valve: The aortic valve is normal in structure. There is mild calcification of the aortic valve. Aortic valve regurgitation is not visualized. Aortic valve sclerosis is present, with no evidence of aortic valve stenosis. Aortic valve mean gradient  measures 3.0 mmHg. Aortic valve peak gradient measures 5.4 mmHg. Aortic valve area, by VTI measures 1.90 cm. Pulmonic Valve: The pulmonic valve was normal in structure. Pulmonic valve regurgitation is not visualized. No evidence of pulmonic stenosis. Aorta: The aortic root is normal in size and structure. Venous: The inferior vena cava is normal in size with greater than 50% respiratory variability, suggesting right atrial pressure of 3 mmHg. IAS/Shunts: No atrial level shunt detected by color flow Doppler.  LEFT VENTRICLE PLAX 2D LVIDd:  3.50 cm   Diastology LVIDs:         2.20 cm    LV e' medial:    5.77 cm/s LV PW:         1.10 cm   LV E/e' medial:  13.4 LV IVS:        1.30 cm   LV e' lateral:   6.31 cm/s LVOT diam:     1.90 cm   LV E/e' lateral: 12.3 LV SV:         38 LV SV Index:   24 LVOT Area:     2.84 cm  RIGHT VENTRICLE RV Basal diam:  3.00 cm RV Mid diam:    2.70 cm RV S prime:     8.70 cm/s TAPSE (M-mode): 1.4 cm LEFT ATRIUM         Index LA diam:    2.60 cm 1.69 cm/m  AORTIC VALVE AV Area (Vmax):    1.55 cm AV Area (Vmean):   1.60 cm AV Area (VTI):     1.90 cm AV Vmax:           116.67 cm/s AV Vmean:          78.833 cm/s AV VTI:            0.199 m AV Peak Grad:      5.4 mmHg AV Mean Grad:      3.0 mmHg LVOT Vmax:         63.90 cm/s LVOT Vmean:        44.400 cm/s LVOT VTI:          0.133 m LVOT/AV VTI ratio: 0.67 MITRAL VALVE                TRICUSPID VALVE MV Area (PHT): 5.23 cm     TR Peak grad:   13.5 mmHg MV Area VTI:   1.73 cm     TR Vmax:        184.00 cm/s MV Peak grad:  5.2 mmHg MV Mean grad:  3.0 mmHg     SHUNTS MV Vmax:       1.14 m/s     Systemic VTI:  0.13 m MV Vmean:      75.1 cm/s    Systemic Diam: 1.90 cm MV Decel Time: 145 msec MV E velocity: 77.60 cm/s MV A velocity: 105.00 cm/s MV E/A ratio:  0.74 Julien Nordmann MD Electronically signed by Julien Nordmann MD Signature Date/Time: 02/02/2023/5:36:55 PM    Final    MR BRAIN W WO CONTRAST  Result Date: 02/02/2023 CLINICAL DATA:  Brain metastasis suspected, stage IV lung cancer EXAM: MRI HEAD WITHOUT AND WITH CONTRAST TECHNIQUE: Multiplanar, multiecho pulse sequences of the brain and surrounding structures were obtained without and with intravenous contrast. CONTRAST:  5mL GADAVIST GADOBUTROL 1 MMOL/ML IV SOLN COMPARISON:  01/01/2023 FINDINGS: Brain: No restricted diffusion to suggest acute or subacute infarct. No abnormal parenchymal or meningeal enhancement. No acute hemorrhage, mass, mass effect, or midline shift. No hydrocephalus or extra-axial collection. Normal pituitary and craniocervical junction.  Redemonstrated chronic microhemorrhage in the left superior frontal gyrus. Dilated perivascular spaces in the bilateral basal ganglia. T2 hyperintense signal in the periventricular white matter, likely the sequela of moderate chronic small vessel ischemic disease. Vascular: Normal arterial flow voids. Normal arterial and venous enhancement. Skull and upper cervical spine: Normal marrow signal. Sinuses/Orbits: Clear paranasal sinuses. Status post bilateral lens replacements. Other: The mastoid air cells are well aerated. IMPRESSION: No acute intracranial  process. No evidence of metastatic disease in the brain. Electronically Signed   By: Wiliam Ke M.D.   On: 02/02/2023 17:24   DG Chest Port 1 View  Result Date: 01/31/2023 CLINICAL DATA:  Pneumonia. EXAM: PORTABLE CHEST 1 VIEW COMPARISON:  Chest radiograph 01/27/2023 FINDINGS: The cardiomediastinal silhouette is unchanged. Dense airspace consolidation in the right upper lobe is stable to mildly increased compared to the prior study. Less extensive consolidation in the left upper lobe has mildly progressed. There are hazy densities in the right greater than left lung bases with pleural effusions demonstrated on today's subsequent CT. Nodular lung densities are again noted and more completely characterized on the subsequent CT. No pneumothorax is identified. Embolization coils are noted in the upper abdomen. IMPRESSION: Mild worsening of bilateral upper lobe consolidation consistent with pneumonia. Electronically Signed   By: Sebastian Ache M.D.   On: 01/31/2023 13:55   CT CHEST WO CONTRAST  Result Date: 01/31/2023 CLINICAL DATA:  History of stage IV lung cancer. Patient presents with fever, tachycardia and hypoxia. * Tracking Code: BO *. EXAM: CT CHEST WITHOUT CONTRAST TECHNIQUE: Multidetector CT imaging of the chest was performed following the standard protocol without IV contrast. RADIATION DOSE REDUCTION: This exam was performed according to the  departmental dose-optimization program which includes automated exposure control, adjustment of the mA and/or kV according to patient size and/or use of iterative reconstruction technique. COMPARISON:  01/23/2023 FINDINGS: Cardiovascular: Heart size is normal. Aortic atherosclerosis. Coronary artery calcification. No pericardial effusion. Mediastinum/Nodes: Right lobe of thyroid gland is not seen. Left lobe appears normal. Trachea appears patent and midline. Unremarkable appearance of the esophagus. The index left supraclavicular lymph node measures 8 mm, image 22/2. Stable from previous exam. Index left pre vascular lymph node measures 0.9 cm, image 34/2. Previously 1.2 cm. Index AP window lymph node measures 2.1 x 1.6 cm, image 51/2. Unchanged from previous exam. Lungs/Pleura: Moderate right pleural effusion and small left pleural effusion identified. Progressive scratch again seen is extensive airspace consolidation within the right upper lobe which appears progressive when compared with the exam from 01/23/2023. Interval development of extensive airspace disease within the left upper lobe and left apex, similar to the right upper lobe. Multiple pulmonary nodules are again seen. The reference nodule within the left lower lobe now appears cavitary measuring 1.9 x 1.2 cm, image 63/3. Previously 1.7 x 1.3 cm. Previous reference nodule in the superior segment of the right lower lobe has undergone central cavitation and decompression. This now has a flattened, discoid configuration measuring 0.6 x 0.3 cm. Previously 0.8 by 0.6 cm. Previous nodule in the superior segment of the left lower lobe now appears cavitary measuring 1.1 x 0.7 cm. Previously 1.8 x 1.3 cm. Upper Abdomen: Fat containing right posterior diaphragmatic hernia is again noted which contains fat only, image 110/2. Small hiatal hernia identified. 3.4 cm simple appearing cyst arises off the upper pole of right kidney. No follow-up imaging  recommended.Status post cholecystectomy. Embolization coils identified within the upper abdomen. Musculoskeletal: Chronic anterolateral right rib fractures. No acute or suspicious osseous findings. IMPRESSION: 1. Interval progression of dense airspace consolidation within the right upper lobe. New dense airspace consolidation within the left upper lobe compared with 01/23/2023. Imaging findings are concerning for worsening multi focal pneumonia. 2. Interval cavitation of previously noted lung nodules which in the setting of septicemia may reflect areas of septic emboli. Metastatic disease is considered less favored but would be difficult to exclude with a high degree of certainty.  Follow-up imaging recommended to ensure resolution. 3. Similar appearance of enlarged upper abdominal lymph nodes 4. Aortic Atherosclerosis (ICD10-I70.0). Coronary artery calcifications. Electronically Signed   By: Signa Kell M.D.   On: 01/31/2023 13:23   PERIPHERAL VASCULAR CATHETERIZATION  Result Date: 01/29/2023 See surgical note for result.  DG Chest Port 1 View  Result Date: 01/27/2023 CLINICAL DATA:  Hypoxia EXAM: PORTABLE CHEST 1 VIEW COMPARISON:  01/25/2023 a CT from 01/23/2023 FINDINGS: Cardiac shadow is stable. Lungs are well aerated bilaterally. Bilateral upper lobe infiltrates are seen right greater than left slightly increased when compared with the prior exam. Linear atelectatic changes are noted along the left cardiac border inferiorly. Some nodularity is seen as well particularly in the left lung. These changes are similar to that seen on prior CT examination. IMPRESSION: Increasing upper lobe consolidation bilaterally. Nodularity similar to that seen on recent CT examination. Electronically Signed   By: Alcide Clever M.D.   On: 01/27/2023 17:25   ECHOCARDIOGRAM COMPLETE  Result Date: 01/26/2023    ECHOCARDIOGRAM REPORT   Patient Name:   AZURA JEHLE Date of Exam: 01/26/2023 Medical Rec #:  [id number]        Height:       60.0 in Accession #:    [id number]      Weight:       127.6 lb Date of Birth:  [birth year]       BSA:          1.542 m Patient Age:    81 years        BP:           127/56 mmHg Patient Gender: F               HR:           94 bpm. Exam Location:  [facility] Procedure: 2D Echo, Cardiac Doppler and Color Doppler Indications:     Stroke I63.9  History:         Patient has no prior history of Echocardiogram examinations.                  Risk Factors:Hypertension. Tobacco abuse, breast cancer.  Sonographer:     Cristela Blue Referring Phys:  [id number] Earna Coder Diagnosing Phys: Marcina Millard MD  Sonographer Comments: Technically challenging study due to limited acoustic windows, no apical window and no subcostal window. IMPRESSIONS  1. Left ventricular ejection fraction, by estimation, is 55 to 60%. The left ventricle has normal function. The left ventricle has no regional wall motion abnormalities. Left ventricular diastolic parameters are indeterminate.  2. Right ventricular systolic function is normal. The right ventricular size is normal.  3. The mitral valve is normal in structure. Mild mitral valve regurgitation. No evidence of mitral stenosis.  4. The aortic valve is normal in structure. Aortic valve regurgitation is not visualized. No aortic stenosis is present.  5. The inferior vena cava is normal in size with greater than 50% respiratory variability, suggesting right atrial pressure of 3 mmHg. FINDINGS  Left Ventricle: Left ventricular ejection fraction, by estimation, is 55 to 60%. The left ventricle has normal function. The left ventricle has no regional wall motion abnormalities. The left ventricular internal cavity size was normal in size. There is  no left ventricular hypertrophy. Left ventricular diastolic parameters are indeterminate. Right Ventricle: The right ventricular size is normal. No increase in right ventricular wall thickness. Right ventricular systolic function is normal.  Left Atrium: Left atrial size was normal  in size. Right Atrium: Right atrial size was normal in size. Pericardium: There is no evidence of pericardial effusion. Mitral Valve: The mitral valve is normal in structure. Mild mitral valve regurgitation. No evidence of mitral valve stenosis. Tricuspid Valve: The tricuspid valve is normal in structure. Tricuspid valve regurgitation is mild . No evidence of tricuspid stenosis. Aortic Valve: The aortic valve is normal in structure. Aortic valve regurgitation is not visualized. No aortic stenosis is present. Pulmonic Valve: The pulmonic valve was normal in structure. Pulmonic valve regurgitation is not visualized. No evidence of pulmonic stenosis. Aorta: The aortic root is normal in size and structure. Venous: The inferior vena cava is normal in size with greater than 50% respiratory variability, suggesting right atrial pressure of 3 mmHg. IAS/Shunts: No atrial level shunt detected by color flow Doppler.  LEFT VENTRICLE PLAX 2D LVIDd:         3.80 cm LVIDs:         2.70 cm LV PW:         1.20 cm LV IVS:        1.20 cm LVOT diam:     2.00 cm LVOT Area:     3.14 cm  LEFT ATRIUM         Index LA diam:    3.40 cm 2.20 cm/m   AORTA Ao Root diam: 2.60 cm  SHUNTS Systemic Diam: 2.00 cm Marcina Millard MD Electronically signed by Marcina Millard MD Signature Date/Time: 01/26/2023/1:03:43 PM    Final    DG Chest Port 1 View  Result Date: 01/25/2023 CLINICAL DATA:  Tachycardia EXAM: PORTABLE CHEST 1 VIEW COMPARISON:  Yesterday FINDINGS: Airspace disease at the upper lobes greater on the right. Haziness at the right base is from pleural fluid by CT. Normal heart size. Artifact from EKG leads. IMPRESSION: Biapical pneumonia that is worse on the right, stable from prior. Electronically Signed   By: Tiburcio Pea M.D.   On: 01/25/2023 05:03   US THORACENTESIS ASP PLEURAL SPACE W/IMG GUIDE  Result Date: 01/24/2023 INDICATION: Parapneumonic pleural effusion EXAM: ULTRASOUND  GUIDED RIGHT THORACENTESIS MEDICATIONS: None. COMPLICATIONS: None immediate. PROCEDURE: An ultrasound guided thoracentesis was thoroughly discussed with the patient and questions answered. The benefits, risks, alternatives and complications were also discussed. The patient understands and wishes to proceed with the procedure. Written consent was obtained. Ultrasound was performed to localize and mark an adequate pocket of fluid in the right chest. The area was then prepped and draped in the normal sterile fashion. 1% Lidocaine was used for local anesthesia. Under ultrasound guidance a 19 gauge, 7-cm, Yueh catheter was introduced. Thoracentesis was performed. The catheter was removed and a dressing applied. FINDINGS: A total of approximately 330 ml of clear yellow fluid was removed. Samples were sent to the laboratory as requested by the clinical team. IMPRESSION: Successful ultrasound guided right thoracentesis yielding 330 mL of pleural fluid. Electronically Signed   By: Olive Bass M.D.   On: 01/24/2023 14:23   DG Chest Port 1 View  Result Date: 01/24/2023 CLINICAL DATA:  Pleural effusion post right thoracentesis. EXAM: PORTABLE CHEST 1 VIEW COMPARISON:  01/23/2023 FINDINGS: Heart size and pulmonary vascularity are normal. No significant pleural effusion or pneumothorax identified. Bilateral upper lobe airspace consolidation with volume loss. Changes may represent pneumonia or postobstructive process. Left upper lung infiltrates are progressing since the previous study. Surgical clips in the right upper quadrant. IMPRESSION: 1. No significant pleural effusion or pneumothorax identified. 2. Bilateral upper lobe airspace consolidation, progressing  on the left since prior study. Electronically Signed   By: Burman Nieves M.D.   On: 01/24/2023 12:05   CT Chest W Contrast  Result Date: 01/23/2023 CLINICAL DATA:  Sepsis; stage IV lung malignancy; * Tracking Code: BO * EXAM: CT CHEST WITH CONTRAST TECHNIQUE:  Multidetector CT imaging of the chest was performed during intravenous contrast administration. RADIATION DOSE REDUCTION: This exam was performed according to the departmental dose-optimization program which includes automated exposure control, adjustment of the mA and/or kV according to patient size and/or use of iterative reconstruction technique. CONTRAST:  75mL OMNIPAQUE IOHEXOL 300 MG/ML  SOLN COMPARISON:  PET-CT dated Feb 03, 2023 FINDINGS: Cardiovascular: Normal heart size. No pericardial effusion. Normal caliber thoracic aorta with severe atherosclerotic disease. Mediastinum/Nodes: Small to moderate hiatal hernia. Enlarged mediastinal and left supraclavicular lymph nodes, unchanged when compared with the prior exam. Reference AP window lymph node measuring 2.1 x 1.8 cm on series 2, image 48, unchanged when compared with the prior exam. Lungs/Pleura: Central airways are patent. Similar left apical mass with associated pleural thickening. New large right upper lobe consolidation and left upper lobe ground-glass opacities. New multiple solid pulmonary nodules. Reference new solid pulmonary nodule of the left lower lobe measuring 1.7 x 1.3 cm. Similar nodular ground-glass opacities of the left upper lobe. New small right-greater-than-left pleural effusions and bibasilar atelectasis. 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 Prior cholecystectomy. Partially visualized simple appearing cyst of the right kidney. Musculoskeletal: Old anterolateral right-sided rib fractures. No aggressive appearing osseous lesions. IMPRESSION: 1. New large right upper lobe consolidation and left upper lobe ground-glass opacities, findings are likely due to pneumonia. 2. New small right-greater-than-left pleural effusions and bibasilar atelectasis. 3. Similar left apical mass with associated pleural thickening. 4. Multiple new large solid pulmonary nodules, concerning for progressive metastatic disease,  although findings could also be infectious. Recommend short-term follow-up 1-2 months for further evaluation. 5. Stable mediastinal and left supraclavicular lymphadenopathy. 6. Aortic Atherosclerosis (ICD10-I70.0). Electronically Signed   By: Allegra Lai M.D.   On: 01/23/2023 16:03   DG Chest Portable 1 View  Result Date: 01/23/2023 CLINICAL DATA:  Fever EXAM: PORTABLE CHEST 1 VIEW COMPARISON:  Radiograph 01/13/2023 FINDINGS: Unchanged cardiomediastinal silhouette. There is confluent right upper lobe airspace disease and patchy airspace opacities in the mid to lower lungs. Trace pleural effusions. Unchanged left apical mass and pleural thickening. No evidence of pneumothorax. No acute osseous abnormality. IMPRESSION: Confluent right upper lobe airspace disease and patchy airspace opacities in the mid to lower lungs, concerning for multifocal pneumonia. Trace pleural effusions. Unchanged left apical mass and pleural thickening. Electronically Signed   By: Caprice Renshaw M.D.   On: 01/23/2023 13:20   DG Chest 2 View  Result Date: 01/13/2023 CLINICAL DATA:  Hypotensive, immunocompromise. 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 There are likely small bilateral pleural effusions. There is no focal lung infiltrate. The cardiomediastinal silhouette is within normal limits. Bones are osteopenic. No acute fractures are seen. There surgical clips in the right abdomen. IMPRESSION: 1. Small bilateral pleural effusions. 2. 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 Electronically Signed   By: Darliss Cheney M.D.   On: 01/13/2023 18:29    Microbiology: Results for orders placed or performed during the hospital encounter of 01/23/23  Culture, blood (Routine x 2)     Status: Abnormal   Collection Time: 01/23/23 12:12 PM   Specimen:  BLOOD  Result Value Ref Range Status   Specimen Description   Final    BLOOD  RIGHT ANTECUBITAL Performed at [facility], [address], [place], [place] [address]    Special Requests   Final    BOTTLES DRAWN AEROBIC AND ANAEROBIC Blood Culture adequate volume Performed at [facility], [address] [place], [place] [address]    Culture  Setup Time   Final    Organism ID to follow GRAM POSITIVE COCCI IN BOTH AEROBIC AND ANAEROBIC BOTTLES CRITICAL RESULT CALLED TO, READ BACK BY AND VERIFIED WITH: JASON ROBBINS PHARMD @0550  01/24/23 ASW Performed at [facility], [address] [place], [place] [address]    Culture (A)  Final    STAPHYLOCOCCUS EPIDERMIDIS STAPHYLOCOCCUS WARNERI THE SIGNIFICANCE OF ISOLATING THIS ORGANISM FROM A SINGLE SET OF BLOOD CULTURES WHEN MULTIPLE SETS ARE DRAWN IS UNCERTAIN. PLEASE NOTIFY THE MICROBIOLOGY DEPARTMENT WITHIN ONE WEEK IF SPECIATION AND SENSITIVITIES ARE REQUIRED. Performed at [facility], [address], [place], [place] [address]    Report Status 01/26/2023 FINAL  Final  Blood Culture ID Panel (Reflexed)     Status: Abnormal   Collection Time: 01/23/23 12:12 PM  Result Value Ref Range Status   Enterococcus faecalis NOT DETECTED NOT DETECTED Final   Enterococcus Faecium NOT DETECTED NOT DETECTED Final   Listeria monocytogenes NOT DETECTED NOT DETECTED Final   Staphylococcus species DETECTED (A) NOT DETECTED Final    Comment: CRITICAL RESULT CALLED TO, READ BACK BY AND VERIFIED WITH: JASON ROBBINS PHARMD @0550  01/24/23 ASW    Staphylococcus aureus (BCID) NOT DETECTED NOT DETECTED Final   Staphylococcus epidermidis DETECTED (A) NOT DETECTED Final    Comment: CRITICAL RESULT CALLED TO, READ BACK BY AND VERIFIED WITH: JASON ROBBINS PHARMD @0550  01/24/23 ASW    Staphylococcus lugdunensis NOT DETECTED NOT DETECTED Final   Streptococcus species NOT DETECTED NOT DETECTED Final   Streptococcus agalactiae NOT DETECTED NOT DETECTED Final   Streptococcus pneumoniae NOT DETECTED NOT DETECTED  Final   Streptococcus pyogenes NOT DETECTED NOT DETECTED Final   A.calcoaceticus-baumannii NOT DETECTED NOT DETECTED Final   Bacteroides fragilis NOT DETECTED NOT DETECTED Final   Enterobacterales NOT DETECTED NOT DETECTED Final   Enterobacter cloacae complex NOT DETECTED NOT DETECTED Final   Escherichia coli NOT DETECTED NOT DETECTED Final   Klebsiella aerogenes NOT DETECTED NOT DETECTED Final   Klebsiella oxytoca NOT DETECTED NOT DETECTED Final   Klebsiella pneumoniae NOT DETECTED NOT DETECTED Final   Proteus species NOT DETECTED NOT DETECTED Final   Salmonella species NOT DETECTED NOT DETECTED Final   Serratia marcescens NOT DETECTED NOT DETECTED Final   Haemophilus influenzae NOT DETECTED NOT DETECTED Final   Neisseria meningitidis NOT DETECTED NOT DETECTED Final   Pseudomonas aeruginosa NOT DETECTED NOT DETECTED Final   Stenotrophomonas maltophilia NOT DETECTED NOT DETECTED Final   Candida albicans NOT DETECTED NOT DETECTED Final   Candida auris NOT DETECTED NOT DETECTED Final   Candida glabrata NOT DETECTED NOT DETECTED Final   Candida krusei NOT DETECTED NOT DETECTED Final   Candida parapsilosis NOT DETECTED NOT DETECTED Final   Candida tropicalis NOT DETECTED NOT DETECTED Final   Cryptococcus neoformans/gattii NOT DETECTED NOT DETECTED Final   Methicillin resistance mecA/C NOT DETECTED NOT DETECTED Final    Comment: Performed at [facility], [address] [place], [place] [address]  Culture, blood (Routine x 2)     Status: None   Collection Time: 01/23/23 12:16 PM  Specimen: BLOOD  Result Value Ref Range Status   Specimen Description BLOOD BLOOD RIGHT FOREARM  Final   Special Requests   Final    BOTTLES DRAWN AEROBIC AND ANAEROBIC Blood Culture adequate volume   Culture   Final    NO GROWTH 5 DAYS Performed at [facility], [address] [place], [place] [address]    Report Status 01/28/2023 FINAL  Final  Resp panel by RT-PCR (RSV, Flu A&B,  Covid) Urine, Clean Catch     Status: None   Collection Time: 01/23/23 12:41 PM   Specimen: Urine, Clean Catch; Nasal Swab  Result Value Ref Range Status   SARS Coronavirus 2 by RT PCR NEGATIVE NEGATIVE Final    Comment: (NOTE) SARS-CoV-2 target nucleic acids are NOT DETECTED.  The SARS-CoV-2 RNA is generally detectable in upper respiratory specimens during the acute phase of infection. The lowest concentration of SARS-CoV-2 viral copies this assay can detect is 138 copies/mL. A negative result does not preclude SARS-Cov-2 infection and should not be used as the sole basis for treatment or other patient management decisions. A negative result may occur with  improper specimen collection/handling, submission of specimen other than nasopharyngeal swab, presence of viral mutation(s) within the areas targeted by this assay, and inadequate number of viral copies(<138 copies/mL). A negative result must be combined with clinical observations, patient history, and epidemiological information. The expected result is Negative.  Fact Sheet for Patients:  BloggerCourse.com  Fact Sheet for Healthcare Providers:  SeriousBroker.it  This test is no t yet approved or cleared by the [place] FDA and  has been authorized for detection and/or diagnosis of SARS-CoV-2 by FDA under an Emergency Use Authorization (EUA). This EUA will remain  in effect (meaning this test can be used) for the duration of the COVID-19 declaration under Section 564(b)(1) of the Act, 21 U.S.C.section 360bbb-3(b)(1), unless the authorization is terminated  or revoked sooner.       Influenza A by PCR NEGATIVE NEGATIVE Final   Influenza B by PCR NEGATIVE NEGATIVE Final    Comment: (NOTE) The Xpert Xpress SARS-CoV-2/FLU/RSV plus assay is intended as an aid in the diagnosis of influenza from Nasopharyngeal swab specimens and should not be used as a sole basis for  treatment. Nasal washings and aspirates are unacceptable for Xpert Xpress SARS-CoV-2/FLU/RSV testing.  Fact Sheet for Patients: BloggerCourse.com  Fact Sheet for Healthcare Providers: SeriousBroker.it  This test is not yet approved or cleared by the [place] FDA and has been authorized for detection and/or diagnosis of SARS-CoV-2 by FDA under an Emergency Use Authorization (EUA). This EUA will remain in effect (meaning this test can be used) for the duration of the COVID-19 declaration under Section 564(b)(1) of the Act, 21 U.S.C. section 360bbb-3(b)(1), unless the authorization is terminated or revoked.     Resp Syncytial Virus by PCR NEGATIVE NEGATIVE Final    Comment: (NOTE) Fact Sheet for Patients: BloggerCourse.com  Fact Sheet for Healthcare Providers: SeriousBroker.it  This test is not yet approved or cleared by the [place] FDA and has been authorized for detection and/or diagnosis of SARS-CoV-2 by FDA under an Emergency Use Authorization (EUA). This EUA will remain in effect (meaning this test can be used) for the duration of the COVID-19 declaration under Section 564(b)(1) of the Act, 21 U.S.C. section 360bbb-3(b)(1), unless the authorization is terminated or revoked.  Performed at [facility], [address], [place], [place] [address]   MRSA Next Gen by PCR, Nasal  Status: None   Collection Time: 01/23/23 10:09 PM   Specimen: Nasal Mucosa; Nasal Swab  Result Value Ref Range Status   MRSA by PCR Next Gen NOT DETECTED NOT DETECTED Final    Comment: (NOTE) The GeneXpert MRSA Assay (FDA approved for NASAL specimens only), is one component of a comprehensive MRSA colonization surveillance program. It is not intended to diagnose MRSA infection nor to guide or monitor treatment for MRSA infections. Test performance is not FDA approved in  patients less than 60 years old. Performed at [facility], [address] [place], [place] [address]   Expectorated Sputum Assessment w Gram Stain, Rflx to Resp Cult     Status: None   Collection Time: 02/01/23  3:58 PM   Specimen: Expectorated Sputum  Result Value Ref Range Status   Specimen Description EXPECTORATED SPUTUM  Final   Special Requests NONE  Final   Sputum evaluation   Final    THIS SPECIMEN IS ACCEPTABLE FOR SPUTUM CULTURE Performed at [facility], [address] [place], [place] [address]    Report Status 02/01/2023 FINAL  Final  Culture, Respiratory w Gram Stain     Status: None   Collection Time: 02/01/23  3:58 PM  Result Value Ref Range Status   Specimen Description   Final    EXPECTORATED SPUTUM Performed at [facility], [address], [place], [place] [address]    Special Requests   Final    NONE Reflexed from [id number] Performed at [facility], [address] [place], [place] [address]    Gram Stain   Final    NO WBC SEEN NO ORGANISMS SEEN Performed at [facility], [address], [place], [place] [address]    Culture   Final    RARE STAPHYLOCOCCUS AUREUS RARE CANDIDA ALBICANS    Report Status 02/05/2023 FINAL  Final   Organism ID, Bacteria STAPHYLOCOCCUS AUREUS  Final      Susceptibility   Staphylococcus aureus - MIC*    CIPROFLOXACIN <=0.5 SENSITIVE Sensitive     ERYTHROMYCIN >=8 RESISTANT Resistant     GENTAMICIN <=0.5 SENSITIVE Sensitive     OXACILLIN <=0.25 SENSITIVE Sensitive     TETRACYCLINE <=1 SENSITIVE Sensitive     VANCOMYCIN 1 SENSITIVE Sensitive     TRIMETH/SULFA <=10 SENSITIVE Sensitive     CLINDAMYCIN RESISTANT Resistant     RIFAMPIN <=0.5 SENSITIVE Sensitive     Inducible Clindamycin POSITIVE Resistant     LINEZOLID 2 SENSITIVE Sensitive     * RARE STAPHYLOCOCCUS AUREUS    Labs: CBC: Recent Labs  Lab 02/02/23 0440 02/03/23 0415 02/04/23 0408 02/06/23 0403  02/07/23 0442  WBC 7.7 7.4 7.1 5.9 6.8  HGB 9.9* 10.8* 10.4* 10.3* 10.2*  HCT 31.1* 33.1* 32.9* 32.0* 32.5*  MCV 89.9 90.4 91.4 90.4 91.8  PLT 112* 139* 147* 148* 150   Basic Metabolic Panel: Recent Labs  Lab 02/01/23 0657 02/02/23 0440 02/03/23 0415 02/04/23 0408 02/06/23 0403 02/06/23 1420 02/07/23 0442  NA 147* 146* 144 146* 145  --  146*  K 3.4* 2.8* 3.4* 3.7 2.5* 3.3* 4.4  CL 114* 112* 111 114* 109  --  114*  CO2 23 25 26 25 28   --  26  GLUCOSE 85 80 74 106* 120*  --  105*  BUN 34* 32* 29* 30* 28*  --  25*  CREATININE 0.92 0.89 0.89 0.93 0.84  --  0.86  CALCIUM 6.4* 6.4* 7.0* 7.3* 6.9*  --  7.1*  MG 2.1 1.8 1.8  --  1.7  --   --   PHOS 2.1* 2.7 3.0  --  3.1  --   --    Liver Function Tests: No results for input(s): "AST", "ALT", "ALKPHOS", "BILITOT", "PROT", "ALBUMIN" in the last 168 hours. CBG: No results for input(s): "GLUCAP" in the last 168 hours.  Discharge time spent: greater than 30 minutes.  Signed: Meredeth Ide, MD [facility] 02/07/2023

## 2023-02-07 NOTE — Progress Notes (Signed)
Physical Therapy Treatment Patient Details Name: Crystal Mcmahon MRN: [id number] DOB: 04-Mar-1941 Today's Date: 02/07/2023   History of Present Illness Pt admitted for sepsis due to PNA with complaints of fever, hypoxia, and tachy. 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    PT Comments    Pt is not making progress towards goals. Has decreased ability to track eye movements, perform grip squeezes, and vocalize during session. Family in room and despite heavy encouragement, pt non-participatory except minimal toe movements. Keeps head turned to R side although can move it with therapist assistance. Pt with delayed response and states she doesn't know what therapist wants her to do. Concerns relayed to care team.    Recommendations for follow up therapy are one component of a multi-disciplinary discharge planning process, led by the attending physician.  Recommendations may be updated based on patient status, additional functional criteria and insurance authorization.  Follow Up Recommendations  Can patient physically be transported by private vehicle: No    Assistance Recommended at Discharge Frequent or constant Supervision/Assistance  Patient can return home with the following Two people to help with walking and/or transfers;A lot of help with bathing/dressing/bathroom;Help with stairs or ramp for entrance;Assistance with cooking/housework;Assistance with feeding;Assist for transportation;Direct supervision/assist for medications management   Equipment Recommendations   (TBD)    Recommendations for Other Services       Precautions / Restrictions Precautions Precautions: Fall Restrictions Weight Bearing Restrictions: No     Mobility  Bed Mobility Overal bed mobility: Needs  Assistance             General bed mobility comments: max/total assist for leaning forward and attempting to sit in bed. Pt reports "wait a minute, wait a minute" and refused further participation    Transfers                   General transfer comment: not able to performed    Ambulation/Gait                   Stairs             Wheelchair Mobility    Modified Rankin (Stroke Patients Only)       Balance                                            Cognition Arousal/Alertness: Suspect due to medications Behavior During Therapy: Flat affect Overall Cognitive Status: Impaired/Different from baseline                                 General Comments: still remains limited in her interactions with therapist        Exercises Other Exercises Other Exercises: attempted supine ther-ex including quad sets, AP and grip squeezes. Poor participation noted. Other Exercises: noted L side weakness, concerns brought to care team. Attempted more formal evaluation, however limited by pt's participation    General Comments        Pertinent Vitals/Pain Pain Assessment Pain Assessment: No/denies pain    Home Living  Prior Function            PT Goals (current goals can now be found in the care plan section) Acute Rehab PT Goals Patient Stated Goal: reports she wants to go home PT Goal Formulation: With patient/family Time For Goal Achievement: 02/13/23 Potential to Achieve Goals: Poor Progress towards PT goals: Not progressing toward goals - comment    Frequency    Min 2X/week      PT Plan Current plan remains appropriate    Co-evaluation              AM-PAC PT "6 Clicks" Mobility   Outcome Measure  Help needed turning from your back to your side while in a flat bed without using bedrails?: Total Help needed moving from lying on your back to sitting on the side of  a flat bed without using bedrails?: Total Help needed moving to and from a bed to a chair (including a wheelchair)?: Total Help needed standing up from a chair using your arms (e.g., wheelchair or bedside chair)?: Total Help needed to walk in hospital room?: Total Help needed climbing 3-5 steps with a railing? : Total 6 Click Score: 6    End of Session Equipment Utilized During Treatment: Oxygen Activity Tolerance: Patient limited by fatigue Patient left: in bed;with bed alarm set Nurse Communication: Mobility status PT Visit Diagnosis: Unsteadiness on feet (R26.81);Muscle weakness (generalized) (M62.81);History of falling (Z91.81);Difficulty in walking, not elsewhere classified (R26.2)     Time: 2130-8657 PT Time Calculation (min) (ACUTE ONLY): 23 min  Charges:  $Therapeutic Exercise: 8-22 mins                     Elizabeth Palau, PT, DPT, GCS [phone]    Crystal Mcmahon 02/07/2023, 1:16 PM

## 2023-02-07 NOTE — TOC Progression Note (Signed)
Transition of Care [facility]) - Progression Note    Patient Details  Name: Crystal Mcmahon MRN: [id number] Date of Birth: [birth year]  [facility] CM/SW Contact  Chapman Fitch, RN Phone Number: 02/07/2023, 8:55 AM  Clinical Narrative:    Son requested additional bed offers be sent to him via text - [facility] again confirms they can not offer a bed - 11 bed offers presented to son Tommy, and TOC requested him to notify me of which facility they choose      Barriers to Discharge: Continued Medical Work up  Expected Discharge Plan and Services       Living arrangements for the past 2 months: Single Family Home                                       Social Determinants of Health (SDOH) Interventions SDOH Screenings   Food Insecurity: No Food Insecurity (01/23/2023)  Housing: Low Risk  (01/23/2023)  Transportation Needs: No Transportation Needs (01/23/2023)  Recent Concern: Transportation Needs - Unmet Transportation Needs (01/16/2023)  Utilities: Not At Risk (01/23/2023)  Alcohol Screen: Low Risk  (01/12/2023)  Depression (PHQ2-9): Low Risk  (11/29/2022)  Financial Resource Strain: Low Risk  (01/12/2023)  Physical Activity: Inactive (01/12/2023)  Social Connections: Moderately Integrated (01/12/2023)  Recent Concern: Social Connections - Moderately Isolated (01/12/2023)  Stress: No Stress Concern Present (01/12/2023)  Tobacco Use: High Risk (01/30/2023)    Readmission Risk Interventions     No data to display

## 2023-02-07 NOTE — Progress Notes (Signed)
Crystal Mcmahon   DOB:01/08/1941   ZO#:[id number]    Subjective: No acute issues overnight.  Patient denies any worsening shortness of breath or cough.  MRI brain-negative for any septic emboli or any metastatic disease pain. Accompanied by sister.   Objective:  Vitals:   02/07/23 0743 02/07/23 0811  BP:  136/64  Pulse: 84 82  Resp: 16 18  Temp:  98.2 F (36.8 C)  SpO2: 94% 97%     Intake/Output Summary (Last 24 hours) at 02/07/2023 1618 Last data filed at 02/07/2023 0600 Gross per 24 hour  Intake 120 ml  Output 325 ml  Net -205 ml   Patient awake; more verbal today.  Alone.   Physical Exam Vitals and nursing note reviewed.  HENT:     Head: Normocephalic and atraumatic.     Mouth/Throat:     Pharynx: Oropharynx is clear.  Eyes:     Extraocular Movements: Extraocular movements intact.     Pupils: Pupils are equal, round, and reactive to light.  Cardiovascular:     Rate and Rhythm: Normal rate and regular rhythm.  Pulmonary:     Comments: Decreased breath sounds bilaterally.  Abdominal:     Palpations: Abdomen is soft.  Musculoskeletal:        General: Normal range of motion.     Cervical back: Normal range of motion.  Skin:    General: Skin is warm.  Neurological:     General: No focal deficit present.     Mental Status: She is alert and oriented to person, place, and time.  Psychiatric:        Behavior: Behavior normal.        Judgment: Judgment normal.      Labs:  Lab Results  Component Value Date   WBC 6.8 02/07/2023   HGB 10.2 (L) 02/07/2023   HCT 32.5 (L) 02/07/2023   MCV 91.8 02/07/2023   PLT 150 02/07/2023   NEUTROABS 6.3 01/25/2023    Lab Results  Component Value Date   NA 146 (H) 02/07/2023   K 4.4 02/07/2023   CL 114 (H) 02/07/2023   CO2 26 02/07/2023    Studies:  DG Chest Port 1 View  Result Date: 02/07/2023 CLINICAL DATA:  Pneumonia. EXAM: PORTABLE CHEST 1 VIEW COMPARISON:  Chest radiograph and CT 01/31/2023 FINDINGS: The  cardiomediastinal silhouette is unchanged. Patchy to confluent airspace opacities persist in the upper lobes but have improved in the interim, particularly in the right upper lobe. Underlying nodular components are noted as shown on CT. There are persistent small pleural effusions, right larger than left. Atelectasis is noted in the right greater than left lung bases. No pneumothorax is identified. IMPRESSION: 1. Interval improvement of bilateral pneumonia. 2. Persistent small pleural effusions. Electronically Signed   By: Sebastian Ache M.D.   On: 02/07/2023 09:02    No problem-specific Assessment & Plan notes found for this encounter.  # 82 year old female patient with multiple medical problems including COPD/history of smoking metastatic lung cancer-currently admitted to hospital for pneumonia/septic shock.   # Metastatic left upper lobe lung cancer-s/p cycle #1 of Keytruda 05/06.  Further therapy is currently on hold given patient's acute acute illness.  Discussed that patient's subsequent treatments will be started only if patient clinically improves/pneumonia resolves.   # Bilateral pneumonia with septic emboli/sepsis-question complicated by aspiration-s/p evaluation with pulmonary.  On Amxocillin- CXR 5/22- improving.     # Apathy-Mental status changes- MAY 2024-MRI brain negative for any acute process.? Depression on  celexa.    # Upper GI bleed/duodenal ulcer-radiation esophagitis-s/p EGD-no further bleeding noted.  Stable.   # Pain secondary to underlying tumor involving left cervical region.  Stable  Discussed with pt's sister by the bed side. Currently awaiting discharge to [facility]/ rehab. Also discussed with patient son, Orvilla Fus phone.  Would recommend reassessment in the next 2 weeks or so based upon patient's improvement in overall health.  However will hold off scheduling appts at cancer center at this time.  The patient's son agrees with the plan-and will call us to make appointment in the  future.  Earna Coder, MD 02/07/2023  4:18 PM

## 2023-02-07 NOTE — TOC Transition Note (Signed)
Transition of Care [facility]) - CM/SW Discharge Note   Patient Details  Name: Crystal Mcmahon MRN: [id number] Date of Birth: Aug 16, 1941  Transition of Care [facility]) CM/SW Contact:  Chapman Fitch, RN Phone Number: 02/07/2023, 2:29 PM   Clinical Narrative:     Son has accepted bed at [facility] . Patient will DC to: [facility]  Anticipated DC date:02/07/23  Family notified: son tommy, and daughter at bedside  Transport by:  Per MD patient ready for DC to . RN, patient, patient's family, and facility notified of DC. Discharge Summary sent to facility. RN given number for report. DC packet on chart. Ambulance transport requested for patient.  TOC signing off.  Bevelyn Ngo [facility] [phone]      Barriers to Discharge: Continued Medical Work up   Patient Goals and CMS Choice CMS Medicare.gov Compare Post Acute Care list provided to:: Patient Represenative (must comment) (son Orvilla Fus) Choice offered to / list presented to : Adult Children  Discharge Placement                         Discharge Plan and Services Additional resources added to the After Visit Summary for                                       Social Determinants of Health (SDOH) Interventions SDOH Screenings   Food Insecurity: No Food Insecurity (01/23/2023)  Housing: Low Risk  (01/23/2023)  Transportation Needs: No Transportation Needs (01/23/2023)  Recent Concern: Transportation Needs - Unmet Transportation Needs (01/16/2023)  Utilities: Not At Risk (01/23/2023)  Alcohol Screen: Low Risk  (01/12/2023)  Depression (PHQ2-9): Low Risk  (11/29/2022)  Financial Resource Strain: Low Risk  (01/12/2023)  Physical Activity: Inactive (01/12/2023)  Social Connections: Moderately Integrated (01/12/2023)  Recent Concern: Social Connections - Moderately Isolated (01/12/2023)  Stress: No Stress Concern Present (01/12/2023)  Tobacco Use: High Risk (01/30/2023)     Readmission Risk Interventions     No data to display

## 2023-02-08 ENCOUNTER — Encounter: Payer: Self-pay | Admitting: *Deleted

## 2023-02-09 ENCOUNTER — Other Ambulatory Visit: Payer: Self-pay | Admitting: Internal Medicine

## 2023-02-13 ENCOUNTER — Inpatient Hospital Stay: Payer: Medicare Other | Admitting: Internal Medicine

## 2023-02-13 ENCOUNTER — Inpatient Hospital Stay: Payer: Medicare Other

## 2023-02-18 ENCOUNTER — Other Ambulatory Visit: Payer: Self-pay | Admitting: Radiation Oncology

## 2023-02-21 ENCOUNTER — Encounter: Payer: Self-pay | Admitting: Hospice and Palliative Medicine

## 2023-02-22 ENCOUNTER — Other Ambulatory Visit: Payer: Self-pay | Admitting: *Deleted

## 2023-02-22 DIAGNOSIS — C3412 Malignant neoplasm of upper lobe, left bronchus or lung: Secondary | ICD-10-CM

## 2023-02-26 ENCOUNTER — Encounter: Payer: Self-pay | Admitting: Nurse Practitioner

## 2023-02-26 ENCOUNTER — Non-Acute Institutional Stay: Payer: Medicare Other | Admitting: Nurse Practitioner

## 2023-02-26 DIAGNOSIS — J9611 Chronic respiratory failure with hypoxia: Secondary | ICD-10-CM

## 2023-02-26 DIAGNOSIS — R0602 Shortness of breath: Secondary | ICD-10-CM

## 2023-02-26 DIAGNOSIS — Z515 Encounter for palliative care: Secondary | ICD-10-CM

## 2023-02-26 DIAGNOSIS — C3490 Malignant neoplasm of unspecified part of unspecified bronchus or lung: Secondary | ICD-10-CM

## 2023-02-26 NOTE — Progress Notes (Signed)
Therapist, nutritional Palliative Care Consult Note Telephone: [phone]  Fax: [phone]    Date of encounter: 02/26/23 3:59 PM PATIENT NAME: Crystal Mcmahon [address] [place] [address]   [phone] (home)  DOB: Nov 14, 1940 MRN: [id number] PRIMARY CARE PROVIDER:    [name], MD,  [address] [place] [address] [phone]  REFERRING PROVIDER:   [name], MD [address] [place],  [place] [address] [phone]  RESPONSIBLE PARTY:    Contact Information     Name Relation Home Work [place] Other [phone]     Crystal Mcmahon   [phone]        [facility] Palliative Care Consult Note Telephone: [phone]  Fax: [phone]   Date of encounter: 02/26/23 4:00 PM PATIENT NAME: Crystal Mcmahon [address] [place] [address]   [phone] (home)  DOB: 04/15/1941 MRN: [id number] PRIMARY CARE PROVIDER:    [name], MD,  [address] [place] [address] [phone]  REFERRING PROVIDER:  [facility] STR RESPONSIBLE PARTY:    Contact Information     Name Relation Home Work Mobile   [place] Other [phone]     Crystal Mcmahon   [phone]      I met face to face with patient in facility. Palliative Care was asked to follow this patient by consultation request of  Crystal Mina, MD/[facility] STR to address advance care planning and complex medical decision making. This is the initial visit.                               ASSESSMENT AND PLAN / RECOMMENDATIONS:  Symptom Management/Plan: 1. Advance Care Planning;  DNR/Comfort care; no Feeding tube, wishes are for IVF/Abx per MOST form. 2. Goals of Care: Goals include to maximize quality of life and symptom management. Our advance care planning conversation included a discussion about:    The value and importance of  advance care planning  Exploration of personal, cultural or spiritual beliefs that might influence medical decisions  Exploration of goals of care in the event of a sudden injury or illness  Identification and preparation of a healthcare agent  Review and updating or creation of an advance directive document. 3. Palliative care encounter; Palliative care encounter; Palliative medicine team will continue to support patient, patient's family, and medical team. Visit consisted of counseling and education dealing with the complex and emotionally intense issues of symptom management and palliative care in the setting of serious and potentially life-threatening illness  4. Shortness of breath secondary to lung cancer, chronic respiratory failure, continue to monitor respiratory status, continue O2, inhalation therapy, continue supportive care.   5. Debility/generalized weakness, therapy is challenging for Crystal Mcmahon with increase in sob with exertion, simple speaking, chronic. Continue to encourage PT/OT with wishes to return home though may not be realistic expectation, will need to see how she does with the rest of STR.   Follow up Palliative Care Visit: Palliative care will continue to follow for complex medical decision making, advance care planning, and clarification of goals. Return 1 to 4 weeks or prn.  I spent 52 minutes providing this consultation. More than 50% of the time in this consultation was spent in counseling and care coordination. PPS: 40%  Chief Complaint: Initial palliative consult for complex medical decision making.  HISTORY OF PRESENT ILLNESS:  Crystal Mcmahon is a 82 y.o. 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 Chest CT scan on 5/15 showed bilateral upper lobe pneumonia, with some cavitary lesion, taken off her antihypertensives due to hypotension and s/p dex taper. Echocardiogram was performed, ejection fraction 60 to 65%, no vegetation observed. MRI of the brain did not show any septic emboli versus metastasis. Acute blood loss anemia, IDA, thrombocytopenia. EGD 5/9 found large ulcer just past the pylorus in the duodenum. Another ulcer in the pylorus. The culprit lesion was the duodenal ulcer which was treated. Acute on chronic hypoxic respiration failure, O2, right pleural effusion 2/2 parapneumonic effusion. Right pleural effusion 2/2 parapneumonic effusion thoracentesis on 5/8, with 330 ml removed with cytology did not see any malignant cells.  Pressure Injury Elbow Posterior;Right Stage 2. Crystal Mcmahon was stabilized and d/c to [facility] for STR where she currently resides. Purpose of today PC f/u visit further discussion monitor trends of appetite, weights, monitor for functional, cognitive decline with chronic disease progression, assess any active symptoms, supportive role. She requires assistance for adl's, transfers, mobility. Crystal Mcmahon becomes sob with movement, speaking, eating making it challenging to work with therapy. At present Crystal Mcmahon is sitting in w/c in her room with cousin who helps care for her. Crystal Mcmahon appears chronically ill, fragile. We talked about past medical history, family, social hx, recent hospitalization, ros including sob. We talked about therapy with slow progression. Discussed appetite, which remains poor. We talked about plan to be d/c home though she resides by herself. We talked about goc, Crystal Mcmahon being exhausted and more difficulty doing therapy. We talked about disease progression, Crystal Mcmahon was cooperative with assessment, will follow close. MOST form reviewed, medications, goc, poc. Updated staff and dicussed with NP. PC f/u visit  further discussion monitor trends of appetite, weights, monitor for functional, cognitive decline with chronic disease progression, assess any active symptoms, supportive role.  History obtained from review of EMR, discussion with primary team, and interview with family, facility staff/caregiver and/or Crystal. [place].  I reviewed available labs, medications, imaging, studies and related documents from the EMR.  Records reviewed and summarized above.  Physical Exam: General: frail appearing, thin, debilitated, chronically ill female ENMT: oral mucous membranes moist CV: S1S2, RRR, no LE edema Pulmonary: Scattered rhonchi, decreased bases Musculo: +muscle wasting Skin: warm and dry Neuro:  + generalized weakness,  no cognitive impairment Psych: non-anxious affect, A and O x 3 CURRENT PROBLEM LIST:  Patient Active Problem List   Diagnosis Date Noted   Pneumonia of both upper lobes due to methicillin susceptible Staphylococcus aureus (MSSA) (HCC) 02/07/2023   Aspiration pneumonia (HCC) 02/01/2023   Gastrointestinal hemorrhage 01/31/2023   Hypernatremia 01/31/2023   Hypophosphatemia 01/31/2023   Thrombocytopenia (HCC) 01/31/2023   Duodenal ulcer with hemorrhage 01/31/2023   Acute on chronic respiratory failure with hypoxia (HCC) 01/31/2023   Palliative care encounter 01/25/2023   Pressure injury of skin 01/25/2023   Malnutrition of moderate degree 01/25/2023   Septic shock (HCC) 01/23/2023   Pneumonia of right upper lobe due to infectious organism 01/23/2023   Stage 4 lung cancer (HCC) 12/22/2022   Cancer, metastatic to bone (HCC) 11/28/2022   Arthritis 07/06/2015   Malignant neoplasm of breast (HCC) 07/06/2015  BP (high blood pressure) 07/06/2015   Headache, migraine 07/06/2015   Personal history of tobacco use, presenting hazards to health 05/31/2015   Tobacco abuse 01/27/2015   PAST MEDICAL HISTORY:  Active Ambulatory Problems    Diagnosis Date Noted   Tobacco abuse 01/27/2015    Personal history of tobacco use, presenting hazards to health 05/31/2015   Arthritis 07/06/2015   Malignant neoplasm of breast (HCC) 07/06/2015   BP (high blood pressure) 07/06/2015   Headache, migraine 07/06/2015   Cancer, metastatic to bone (HCC) 11/28/2022   Stage 4 lung cancer (HCC) 12/22/2022   Septic shock (HCC) 01/23/2023   Pneumonia of right upper lobe due to infectious organism 01/23/2023   Palliative care encounter 01/25/2023   Pressure injury of skin 01/25/2023   Malnutrition of moderate degree 01/25/2023   Gastrointestinal hemorrhage 01/31/2023   Hypernatremia 01/31/2023   Hypophosphatemia 01/31/2023   Thrombocytopenia (HCC) 01/31/2023   Duodenal ulcer with hemorrhage 01/31/2023   Acute on chronic respiratory failure with hypoxia (HCC) 01/31/2023   Aspiration pneumonia (HCC) 02/01/2023   Pneumonia of both upper lobes due to methicillin susceptible Staphylococcus aureus (MSSA) (HCC) 02/07/2023   Resolved Ambulatory Problems    Diagnosis Date Noted   No Resolved Ambulatory Problems   Past Medical History:  Diagnosis Date   Actinic keratosis    Basal cell carcinoma 06/14/2020   Breast cancer (HCC) 2008   Cancer [facility])    Complication of anesthesia    GERD (gastroesophageal reflux disease)    Hypertension    Melanoma (HCC) 02/01/2010   Personal history of radiation therapy    SOCIAL HX:  Social History   Tobacco Use   Smoking status: Every Day    Packs/day: 0.25    Years: 32.00    Additional pack years: 0.00    Total pack years: 8.00    Types: Cigarettes   Smokeless tobacco: Never  Substance Use Topics   Alcohol use: No    Alcohol/week: 0.0 standard drinks of alcohol    Comment: occassional   FAMILY HX:  Family History  Problem Relation Age of Onset   Pancreatic cancer Father    Breast cancer Neg Hx       ALLERGIES:  Allergies  Allergen Reactions   Acetaminophen-Pamabrom Rash and Swelling    Other reaction(s): Other (See Comments)  Other  Reaction: RASH & SWELLING   Codeine Nausea And Vomiting   Midol Pm [Diphenhydramine-Apap (Sleep)] Swelling and Rash          PERTINENT MEDICATIONS:  Outpatient Encounter Medications as of 02/26/2023  Medication Sig   acetaminophen (TYLENOL) 650 MG CR tablet Take 650 mg by mouth every 8 (eight) hours as needed for pain.   albuterol (PROVENTIL) (2.5 MG/3ML) 0.083% nebulizer solution Take 3 mLs (2.5 mg total) by nebulization every 6 (six) hours as needed for wheezing or shortness of breath.   arformoterol (BROVANA) 15 MCG/2ML NEBU Take 2 mLs (15 mcg total) by nebulization 2 (two) times daily.   calcium carbonate (OSCAL) 1500 (600 CA) MG TABS tablet Take 600 mg of elemental calcium by mouth 2 (two) times daily with a meal.   citalopram (CELEXA) 10 MG tablet Take 10 mg by mouth daily.   docusate sodium (COLACE) 100 MG capsule Take 1 capsule (100 mg total) by mouth 2 (two) times daily as needed for mild constipation.   feeding supplement (ENSURE ENLIVE / ENSURE PLUS) LIQD Take 237 mLs by mouth 3 (three) times daily between meals.   gabapentin (NEURONTIN) 300  MG capsule Take 300 mg by mouth in the morning, at noon, and at bedtime.   Multiple Vitamin (MULTIVITAMIN WITH MINERALS) TABS tablet Take 1 tablet by mouth daily.   Nutritional Supplements (,FEEDING SUPPLEMENT, PROSOURCE PLUS) liquid Take 30 mLs by mouth 3 (three) times daily between meals.   ondansetron (ZOFRAN) 8 MG tablet One pill every 8 hours as needed for nausea/vomitting. (Patient taking differently: Take 8 mg by mouth every 8 (eight) hours as needed for nausea or vomiting. One pill every 8 hours as needed for nausea/vomitting.)   oxyCODONE (OXY IR/ROXICODONE) 5 MG immediate release tablet Take 1 tablet (5 mg total) by mouth every 4 (four) hours as needed for severe pain or moderate pain.   pantoprazole (PROTONIX) 40 MG tablet Take 1 tablet (40 mg total) by mouth 2 (two) times daily before a meal.   polyethylene glycol (MIRALAX / GLYCOLAX)  17 g packet Take 17 g by mouth daily as needed for moderate constipation.   revefenacin (YUPELRI) 175 MCG/3ML nebulizer solution Take 3 mLs (175 mcg total) by nebulization daily.   No facility-administered encounter medications on file as of 02/26/2023.   Thank you for the opportunity to participate in the care of Crystal. Elvin.  The palliative care team will continue to follow. Please call our office at [phone] if we can be of additional assistance.   Isam Unrein Z Rilee Knoll, NP ,

## 2023-03-01 ENCOUNTER — Ambulatory Visit: Payer: Medicare Other | Admitting: Radiation Oncology

## 2023-03-06 ENCOUNTER — Encounter: Payer: Self-pay | Admitting: Nurse Practitioner

## 2023-03-06 ENCOUNTER — Non-Acute Institutional Stay: Payer: Medicare Other | Admitting: Nurse Practitioner

## 2023-03-06 DIAGNOSIS — R0602 Shortness of breath: Secondary | ICD-10-CM

## 2023-03-06 DIAGNOSIS — C3490 Malignant neoplasm of unspecified part of unspecified bronchus or lung: Secondary | ICD-10-CM

## 2023-03-06 DIAGNOSIS — J9611 Chronic respiratory failure with hypoxia: Secondary | ICD-10-CM

## 2023-03-06 DIAGNOSIS — Z515 Encounter for palliative care: Secondary | ICD-10-CM

## 2023-03-06 NOTE — Progress Notes (Signed)
Therapist, nutritional Palliative Care Consult Note Telephone: [phone]  Fax: [phone]    Date of encounter: 03/06/23 6:30 PM PATIENT NAME: Crystal Mcmahon [address] [place] [address]   [phone] (home)  DOB: 21-May-1941 MRN: [id number] PRIMARY CARE PROVIDER:    [name], MD,  [address] [place] [address] [phone]  Referring:  [facility]  RESPONSIBLE PARTY:    Contact Information     Name Relation Home Work Mobile   Crystal Mcmahon Other [phone]     Crystal Mcmahon, Crystal Mcmahon   [phone]      I met face to face with patient in facility. Palliative Care was asked to follow this patient by consultation request of  Crystal Mina, MD/[facility] STR to address advance care planning and complex medical decision making. This is the initial visit.                               ASSESSMENT AND PLAN / RECOMMENDATIONS:  Symptom Management/Plan: 1. Advance Care Planning;  DNR/Comfort care; no Feeding tube, wishes are for IVF/Abx per MOST form. 2. Palliative care encounter; Palliative care encounter; Palliative medicine team will continue to support patient, patient's family, and medical team. Visit consisted of counseling and education dealing with the complex and emotionally intense issues of symptom management and palliative care in the setting of serious and potentially life-threatening illness   3. Shortness of breath secondary to lung cancer, chronic respiratory failure, slight improvement; continue to monitor respiratory status, continue O2, inhalation therapy, continue supportive care.    4. Debility/generalized weakness, slight improvement per Crystal Mcmahon, starting to feel better with PT/OT, less sob with activity. Continue to encourage PT/OT with wishes to return home though may not be realistic expectation, will need to see how she does with the rest of STR.    Follow up Palliative Care Visit:  Palliative care will continue to follow for complex medical decision making, advance care planning, and clarification of goals. Return 1 to 4 weeks or prn.   I spent 45 minutes providing this consultation. More than 50% of the time in this consultation was spent in counseling and care coordination. PPS: 40%   Chief Complaint: Initial palliative consult for complex medical decision making.   HISTORY OF PRESENT ILLNESS:  Crystal Mcmahon is a 82 y.o. 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 Chest CT scan on 5/15 showed bilateral upper lobe pneumonia, with some cavitary lesion, taken off her antihypertensives due to hypotension and s/p dex taper. Echocardiogram was performed, ejection fraction 60 to 65%, no vegetation observed. MRI of the brain did not show any septic emboli versus metastasis. Acute blood loss anemia, IDA, thrombocytopenia. EGD 5/9 found large ulcer just past the pylorus in the duodenum. Another ulcer in the pylorus. Culprit lesion was the duodenal ulcer which was treated. Acute on chronic hypoxic respiration failure, O2, right pleural effusion 2/2 parapneumonic effusion. Right pleural effusion 2/2 parapneumonic effusion thoracentesis on 5/8, with 330 ml removed with cytology did not see any malignant cells.  Pressure Injury Elbow Posterior;Right Stage 2. Crystal Mcmahon was stabilized and d/c to [facility] for STR where she currently resides. Purpose of  today PC f/u visit further discussion monitor trends of appetite, weights, monitor for functional, cognitive decline with chronic disease progression, assess any active symptoms, supportive role. She requires assistance for adl's, transfers,  mobility. Crystal Mcmahon becomes sob with movement, speaking, eating making it challenging to work with therapy. At present Crystal Mcmahon is lying in bed, appears more comfortable today. Cousin at bedside. We talked about purpose of pc visit, ros, functional abilities, work with therapy PT/OT, we focused on sob, O2 dependence, appetite which has improved slightly today with breakfast. We talked about fatigue with exertion. We talked about wishes to continue to go home, her cousin does help. We talked about appetite, nutrition, mobility. We talked about residing at facility, will continue current plan and see how she does. Medications, ros, poc reviewed. Support provided. Medications, goc, poc. Updated staff and dicussed with NP. PC f/u visit further discussion monitor trends of appetite, weights, monitor for functional, cognitive decline with chronic disease progression, assess any active symptoms, supportive role.   History obtained from review of EMR, discussion with primary team, and interview with family, facility staff/caregiver and/or Crystal. Mcmahon.  I reviewed available labs, medications, imaging, studies and related documents from the EMR.  Records reviewed and summarized above.  Physical Exam: General: frail appearing, thin, debilitated, chronically ill female ENMT: oral mucous membranes moist CV: S1S2, RRR, no LE edema Pulmonary: Scattered rhonchi, decreased bases Musculo: +muscle wasting Neuro:  + generalized weakness,  no cognitive impairment Psych: non-anxious affect, A and O x 3 Thank you for the opportunity to participate in the care of Crystal. Mcmahon. Please call our office at [phone] if we can be of additional assistance.   Crystal Castille [place], NP

## 2023-04-11 ENCOUNTER — Encounter: Payer: Self-pay | Admitting: Internal Medicine

## 2023-04-13 ENCOUNTER — Telehealth: Payer: Self-pay | Admitting: *Deleted

## 2023-04-13 ENCOUNTER — Encounter: Payer: Self-pay | Admitting: Internal Medicine

## 2023-04-13 NOTE — Telephone Encounter (Signed)
Patient's son would like a call from the team regarding further treatment for the patient. She is ready to be discharged for [facility].

## 2023-04-16 ENCOUNTER — Encounter: Payer: Self-pay | Admitting: Internal Medicine

## 2023-04-16 NOTE — Telephone Encounter (Signed)
Marcelino Duster B has taken care of this.

## 2023-04-17 ENCOUNTER — Telehealth: Payer: Self-pay | Admitting: *Deleted

## 2023-04-17 ENCOUNTER — Other Ambulatory Visit: Payer: Self-pay | Admitting: *Deleted

## 2023-04-17 DIAGNOSIS — C3412 Malignant neoplasm of upper lobe, left bronchus or lung: Secondary | ICD-10-CM

## 2023-04-17 NOTE — Telephone Encounter (Signed)
Per request from Daughter in law, Crystal Mcmahon, a referral has been sent to [facility]. Pt is discharging to home from [facility].

## 2023-04-17 NOTE — Telephone Encounter (Signed)
Daughter in Law Thamara Milward) called and said that they would like referral for Hospice care. Patient is to be discharged from rehab. She states that they did speak with [facility] and would use them unless Dr B had a better recommendation. Her call back # is [phone]

## 2023-04-18 ENCOUNTER — Other Ambulatory Visit: Payer: Self-pay | Admitting: Internal Medicine

## 2023-05-07 ENCOUNTER — Encounter: Payer: Self-pay | Admitting: Internal Medicine

## 2023-06-01 ENCOUNTER — Telehealth: Payer: Self-pay | Admitting: *Deleted

## 2023-06-01 NOTE — Telephone Encounter (Signed)
Hospice nurse called reporting that for the past 24 hours, patient has not been able to eat or drink, she vomits every time she drinks water or takes her pills. The Zofran has been ineffective in controlling her nausea and vomiting. She is asking what the next steps are for this patient symptoms. Please return her call

## 2023-06-01 NOTE — Telephone Encounter (Signed)
I spoke with RN. Patient having nausea/vomiting poorly controlled on Zofran. Normal and regular BMs without abdominal pain/distension. Recommended adding Compazine 10mg  Q6H PRN.

## 2023-06-04 ENCOUNTER — Encounter: Payer: Self-pay | Admitting: Internal Medicine

## 2023-06-04 ENCOUNTER — Encounter: Payer: Self-pay | Admitting: Hospice and Palliative Medicine

## 2023-06-12 ENCOUNTER — Other Ambulatory Visit: Payer: Self-pay | Admitting: *Deleted

## 2023-06-12 ENCOUNTER — Encounter: Payer: Self-pay | Admitting: Hospice and Palliative Medicine

## 2023-06-12 MED ORDER — GABAPENTIN 300 MG PO CAPS: 300.0000 mg | ORAL_CAPSULE | Freq: Three times a day (TID) | ORAL | 1 refills | Status: DC

## 2023-06-12 NOTE — Telephone Encounter (Signed)
Family sent mychart msg to request RF on gabapentin for patient. Script pended

## 2023-07-20 DEATH — deceased
